# Patient Record
Sex: Female | Born: 1965 | Race: White | Hispanic: No | Marital: Married | Smoking: Never smoker
Health system: Southern US, Community
[De-identification: ages and names within clinical notes are randomized; demographics above are authoritative.]

## PROBLEM LIST (undated history)

## (undated) DIAGNOSIS — E7800 Pure hypercholesterolemia, unspecified: Secondary | ICD-10-CM

## (undated) DIAGNOSIS — Z803 Family history of malignant neoplasm of breast: Secondary | ICD-10-CM

## (undated) DIAGNOSIS — Z9221 Personal history of antineoplastic chemotherapy: Secondary | ICD-10-CM

## (undated) DIAGNOSIS — G4700 Insomnia, unspecified: Secondary | ICD-10-CM

## (undated) DIAGNOSIS — R232 Flushing: Secondary | ICD-10-CM

## (undated) DIAGNOSIS — Z170 Estrogen receptor positive status [ER+]: Principal | ICD-10-CM

## (undated) DIAGNOSIS — M2550 Pain in unspecified joint: Secondary | ICD-10-CM

## (undated) DIAGNOSIS — J302 Other seasonal allergic rhinitis: Secondary | ICD-10-CM

## (undated) DIAGNOSIS — F419 Anxiety disorder, unspecified: Secondary | ICD-10-CM

## (undated) DIAGNOSIS — C50511 Malignant neoplasm of lower-outer quadrant of right female breast: Principal | ICD-10-CM

## (undated) HISTORY — DX: Flushing: R23.2

## (undated) HISTORY — DX: Family history of malignant neoplasm of breast: Z80.3

## (undated) HISTORY — PX: APPENDECTOMY: SHX54

## (undated) HISTORY — DX: Malignant neoplasm of lower-outer quadrant of right female breast: C50.511

## (undated) HISTORY — DX: Pain in unspecified joint: M25.50

## (undated) HISTORY — DX: Pure hypercholesterolemia, unspecified: E78.00

## (undated) HISTORY — PX: WISDOM TOOTH EXTRACTION: SHX21

## (undated) HISTORY — DX: Insomnia, unspecified: G47.00

## (undated) HISTORY — PX: ADENOIDECTOMY W/ MYRINGOTOMY: SHX1128

## (undated) HISTORY — DX: Hypocalcemia: E83.51

## (undated) HISTORY — DX: Estrogen receptor positive status (ER+): Z17.0

---

## 2013-05-14 HISTORY — PX: ABDOMINAL HYSTERECTOMY: SHX81

## 2013-08-25 ENCOUNTER — Other Ambulatory Visit (HOSPITAL_BASED_OUTPATIENT_CLINIC_OR_DEPARTMENT_OTHER): Payer: Self-pay | Admitting: Physician Assistant

## 2013-08-25 ENCOUNTER — Other Ambulatory Visit (HOSPITAL_BASED_OUTPATIENT_CLINIC_OR_DEPARTMENT_OTHER): Payer: Self-pay

## 2013-08-25 DIAGNOSIS — K589 Irritable bowel syndrome without diarrhea: Secondary | ICD-10-CM

## 2013-08-25 DIAGNOSIS — R1010 Upper abdominal pain, unspecified: Secondary | ICD-10-CM

## 2013-08-26 ENCOUNTER — Encounter (HOSPITAL_BASED_OUTPATIENT_CLINIC_OR_DEPARTMENT_OTHER): Payer: Self-pay

## 2013-08-26 ENCOUNTER — Ambulatory Visit (HOSPITAL_BASED_OUTPATIENT_CLINIC_OR_DEPARTMENT_OTHER)
Admission: RE | Admit: 2013-08-26 | Discharge: 2013-08-26 | Disposition: A | Discharge status: Home / Self Care | Payer: 59 | Source: Ambulatory Visit | Attending: Physician Assistant | Admitting: Physician Assistant

## 2013-08-26 DIAGNOSIS — D259 Leiomyoma of uterus, unspecified: Secondary | ICD-10-CM | POA: Insufficient documentation

## 2013-08-26 DIAGNOSIS — J8410 Pulmonary fibrosis, unspecified: Secondary | ICD-10-CM | POA: Insufficient documentation

## 2013-08-26 DIAGNOSIS — M954 Acquired deformity of chest and rib: Secondary | ICD-10-CM | POA: Insufficient documentation

## 2013-08-26 DIAGNOSIS — K589 Irritable bowel syndrome without diarrhea: Secondary | ICD-10-CM

## 2013-08-26 DIAGNOSIS — K7689 Other specified diseases of liver: Secondary | ICD-10-CM | POA: Insufficient documentation

## 2013-08-26 DIAGNOSIS — K389 Disease of appendix, unspecified: Secondary | ICD-10-CM | POA: Insufficient documentation

## 2013-08-26 DIAGNOSIS — R1010 Upper abdominal pain, unspecified: Secondary | ICD-10-CM

## 2013-08-26 DIAGNOSIS — R109 Unspecified abdominal pain: Secondary | ICD-10-CM | POA: Insufficient documentation

## 2013-08-26 MED ORDER — IOHEXOL 300 MG/ML  SOLN
100.0000 mL | Freq: Once | INTRAMUSCULAR | Status: AC | PRN
  Administered 2013-08-26: 100 mL via INTRAVENOUS

## 2015-10-22 ENCOUNTER — Encounter (HOSPITAL_BASED_OUTPATIENT_CLINIC_OR_DEPARTMENT_OTHER): Payer: Self-pay | Admitting: *Deleted

## 2015-10-22 ENCOUNTER — Emergency Department (HOSPITAL_BASED_OUTPATIENT_CLINIC_OR_DEPARTMENT_OTHER)
Admission: EM | Admit: 2015-10-22 | Discharge: 2015-10-22 | Disposition: A | Discharge status: Home / Self Care | Payer: BLUE CROSS/BLUE SHIELD | Attending: Emergency Medicine | Admitting: Emergency Medicine

## 2015-10-22 ENCOUNTER — Emergency Department (HOSPITAL_BASED_OUTPATIENT_CLINIC_OR_DEPARTMENT_OTHER): Payer: BLUE CROSS/BLUE SHIELD

## 2015-10-22 DIAGNOSIS — S8991XA Unspecified injury of right lower leg, initial encounter: Secondary | ICD-10-CM | POA: Diagnosis present

## 2015-10-22 DIAGNOSIS — Y9389 Activity, other specified: Secondary | ICD-10-CM | POA: Insufficient documentation

## 2015-10-22 DIAGNOSIS — Y999 Unspecified external cause status: Secondary | ICD-10-CM | POA: Diagnosis not present

## 2015-10-22 DIAGNOSIS — Y92410 Unspecified street and highway as the place of occurrence of the external cause: Secondary | ICD-10-CM | POA: Insufficient documentation

## 2015-10-22 DIAGNOSIS — S8011XA Contusion of right lower leg, initial encounter: Secondary | ICD-10-CM | POA: Insufficient documentation

## 2015-10-22 MED ORDER — IBUPROFEN 400 MG PO TABS
600.0000 mg | ORAL_TABLET | Freq: Once | ORAL | Status: AC
  Administered 2015-10-22: 600 mg via ORAL
  Filled 2015-10-22: qty 1
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
## 2015-10-22 NOTE — Discharge Instructions (Signed)
Contusion °A contusion is a deep bruise. Contusions are the result of a blunt injury to tissues and muscle fibers under the skin. The injury causes bleeding under the skin. The skin overlying the contusion may turn blue, purple, or yellow. Minor injuries will give you a painless contusion, but more severe contusions may stay painful and swollen for a few weeks.  °CAUSES  °This condition is usually caused by a blow, trauma, or direct force to an area of the body. °SYMPTOMS  °Symptoms of this condition include: °· Swelling of the injured area. °· Pain and tenderness in the injured area. °· Discoloration. The area may have redness and then turn blue, purple, or yellow. °DIAGNOSIS  °This condition is diagnosed based on a physical exam and medical history. An X-ray, CT scan, or MRI may be needed to determine if there are any associated injuries, such as broken bones (fractures). °TREATMENT  °Specific treatment for this condition depends on what area of the body was injured. In general, the best treatment for a contusion is resting, icing, applying pressure to (compression), and elevating the injured area. This is often called the RICE strategy. Over-the-counter anti-inflammatory medicines may also be recommended for pain control.  °HOME CARE INSTRUCTIONS  °· Rest the injured area. °· If directed, apply ice to the injured area: °· Put ice in a plastic bag. °· Place a towel between your skin and the bag. °· Leave the ice on for 20 minutes, 2-3 times per day. °· If directed, apply light compression to the injured area using an elastic bandage. Make sure the bandage is not wrapped too tightly. Remove and reapply the bandage as directed by your health care provider. °· If possible, raise (elevate) the injured area above the level of your heart while you are sitting or lying down. °· Take over-the-counter and prescription medicines only as told by your health care provider. °SEEK MEDICAL CARE IF: °· Your symptoms do not  improve after several days of treatment. °· Your symptoms get worse. °· You have difficulty moving the injured area. °SEEK IMMEDIATE MEDICAL CARE IF:  °· You have severe pain. °· You have numbness in a hand or foot. °· Your hand or foot turns pale or cold. °  °This information is not intended to replace advice given to you by your health care provider. Make sure you discuss any questions you have with your health care provider. °  °Document Released: 02/07/2005 Document Revised: 01/19/2015 Document Reviewed: 09/15/2014 °[employer]vier Interactive Patient Education ©2016 [employer] ° °Motor Vehicle Collision °It is common to have multiple bruises and sore muscles after a motor vehicle collision (MVC). These tend to feel worse for the first 24 hours. You may have the most stiffness and soreness over the first several hours. You may also feel worse when you wake up the first morning after your collision. After this point, you will usually begin to improve with each day. The speed of improvement often depends on the severity of the collision, the number of injuries, and the location and nature of these injuries. °HOME CARE INSTRUCTIONS °· Put ice on the injured area. °¨ Put ice in a plastic bag. °¨ Place a towel between your skin and the bag. °¨ Leave the ice on for 15-20 minutes, 3-4 times a day, or as directed by your health care provider. °· Drink enough fluids to keep your urine clear or pale yellow. Do not drink alcohol. °· Take a warm shower or bath once or twice a   day. This will increase blood flow to sore muscles. °· You may return to activities as directed by your caregiver. Be careful when lifting, as this may aggravate neck or back pain. °· Only take over-the-counter or prescription medicines for pain, discomfort, or fever as directed by your caregiver. Do not use aspirin. This may increase bruising and bleeding. °SEEK IMMEDIATE MEDICAL CARE IF: °· You have numbness, tingling, or weakness in the arms or  legs. °· You develop severe headaches not relieved with medicine. °· You have severe neck pain, especially tenderness in the middle of the back of your neck. °· You have changes in bowel or bladder control. °· There is increasing pain in any area of the body. °· You have shortness of breath, light-headedness, dizziness, or fainting. °· You have chest pain. °· You feel sick to your stomach (nauseous), throw up (vomit), or sweat. °· You have increasing abdominal discomfort. °· There is blood in your urine, stool, or vomit. °· You have pain in your shoulder (shoulder strap areas). °· You feel your symptoms are getting worse. °MAKE SURE YOU: °· Understand these instructions. °· Will watch your condition. °· Will get help right away if you are not doing well or get worse. °  °This information is not intended to replace advice given to you by your health care provider. Make sure you discuss any questions you have with your health care provider. °  °Document Released: 04/30/2005 Document Revised: 05/21/2014 Document Reviewed: 09/27/2010 °[employer]vier Interactive Patient Education ©2016 [employer] ° °

## 2015-10-22 NOTE — ED Notes (Signed)
Pt given d/c instructions as per chart. Verbalizes understanding. No questions. 

## 2016-12-25 ENCOUNTER — Other Ambulatory Visit: Payer: Self-pay | Admitting: Obstetrics and Gynecology

## 2016-12-25 DIAGNOSIS — R921 Mammographic calcification found on diagnostic imaging of breast: Secondary | ICD-10-CM

## 2016-12-25 DIAGNOSIS — N63 Unspecified lump in unspecified breast: Secondary | ICD-10-CM

## 2017-01-01 ENCOUNTER — Other Ambulatory Visit: Payer: Self-pay | Admitting: Obstetrics and Gynecology

## 2017-01-01 DIAGNOSIS — R921 Mammographic calcification found on diagnostic imaging of breast: Secondary | ICD-10-CM

## 2017-01-01 DIAGNOSIS — N63 Unspecified lump in unspecified breast: Secondary | ICD-10-CM

## 2017-01-02 ENCOUNTER — Ambulatory Visit
Admission: RE | Admit: 2017-01-02 | Discharge: 2017-01-02 | Disposition: A | Discharge status: Home / Self Care | Payer: PRIVATE HEALTH INSURANCE | Source: Ambulatory Visit | Attending: Obstetrics and Gynecology | Admitting: Obstetrics and Gynecology

## 2017-01-02 DIAGNOSIS — N63 Unspecified lump in unspecified breast: Secondary | ICD-10-CM

## 2017-01-02 DIAGNOSIS — R921 Mammographic calcification found on diagnostic imaging of breast: Secondary | ICD-10-CM

## 2017-01-03 ENCOUNTER — Encounter: Payer: Self-pay | Admitting: *Deleted

## 2017-01-03 ENCOUNTER — Telehealth: Payer: Self-pay | Admitting: Hematology and Oncology

## 2017-01-03 NOTE — Telephone Encounter (Signed)
Patient confirmed [facility] appointment for 01/09/17 @ 8:15 am

## 2017-01-07 NOTE — Progress Notes (Addendum)
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 female who is seen today in our multidisciplinary breast clinic. The patient presented for routine screening mammogram at [facility] on 12/03/2016 and no mammographic evidence of malignancy was seen. However, the patient returned for diagnostic mammogram and right breast ultrasound at [facility] on 12/24/2016 complaining of palpable mass in the right lower breast detected clinically. These scans showed a suspicious ill-marginated irregular heterogenous hypoechoic mass in the 6:30 o'clock position 3 cm from the nipple of the right breast, measuring 3.6 x 1.6 x 2.2 cm. Also seen were indeterminate calcifications in the upper inner quadrant of the right breast and no evidence of right axillary lymphadenopathy.   Biopsies of the right breast were performed on 01/02/2017. Biopsy of the right breast at the 6:30 o'clock position revealed invasive and in situ mammary carcinoma, Grade 2, ER 95% / PR 95% / HER-2 negative / Ki-67 3%. The malignant cells are negative for E-Cadherin, supporting a lobular phenotype. Biopsy of the upper inner quadrant right breast calcifications showed flat epithelial atypia (FEA) with calcifications.   PREVIOUS RADIATION THERAPY: No  PAST MEDICAL HISTORY:  has a past medical history of 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, estrogen receptor positive ([place]) (01/08/2017).    PAST  SURGICAL HISTORY: Past Surgical History:  Procedure Laterality Date  . ABDOMINAL HYSTERECTOMY    . APPENDECTOMY      FAMILY HISTORY: family history is not on file.  SOCIAL HISTORY:  reports that she has never smoked. She does not have any smokeless tobacco history on file. She reports that she does not drink alcohol or use drugs.  ALLERGIES: Patient has no known allergies.  MEDICATIONS:  No current outpatient prescriptions on file.   No current facility-administered medications for this encounter.    Gynecologic History  Age at first menstrual period? N/A  Are you still having periods? No Approximate date of last period? October 2015  If you are still having periods: Are your periods regular? N/A  If you no longer have periods: Have you used hormone replacement? No  If YES, for how long? N/A When did you stop? N/A Obstetric History:  How many children have you carried to term? 2 Your age at first live birth? 42  Pregnant now or trying to get pregnant? No  Have you used birth control pills or hormone shots for contraception? Yes  If so, for how long (or approximate dates)? 1505-6979  Would you be interested in learning more about the options to preserve fertility? No Health Maintenance:  Have you ever had a colonoscopy? No If yes, date? N/A  Have you ever had a bone density? No If yes, date? N/A  Date of your last PAP smear? October 2017 Date of your FIRST mammogram? 2012   REVIEW OF SYSTEMS:  On review of systems, the patient reports that she is doing well overall. She reports loss of sleep. She uses glasses and contacts. She reports  breast pain and lump. She reports anxiety. A complete review of systems is obtained and is otherwise negative.  REVIEW OF SYSTEMS: A 10+ POINT REVIEW OF SYSTEMS WAS OBTAINED including neurology, dermatology, psychiatry, cardiac, respiratory, lymph, extremities, GI, GU, musculoskeletal, constitutional, reproductive, HEENT. All pertinent positives are  noted in the HPI. All others are negative.   PHYSICAL EXAM:  Vitals with BMI 01/09/2017  Height _0   Weight 122 lbs 2 oz  BMI 20.80  Systolic 223  Diastolic 86  Pulse 80  Respirations 20   General: Alert and oriented, in no acute distress HEENT: Head is normocephalic. Extraocular movements are intact.  Neck: Neck is supple, no palpable cervical or supraclavicular lymphadenopathy. Heart: Regular in rate and rhythm with no murmurs, rubs, or gallops. Chest: Clear to auscultation bilaterally, with no rhonchi, wheezes, or rales. Abdomen: Soft, nontender, nondistended, with no rigidity or guarding. Extremities: No cyanosis or edema. Lymphatics: see Neck Exam Skin: No concerning lesions. Musculoskeletal: symmetric strength and muscle tone throughout. Neurologic: Cranial nerves II through XII are grossly intact. No obvious focalities. Speech is fluent. Coordination is intact. Psychiatric: Judgment and insight are intact. Affect is appropriate. Breast: Left breast no palpable mass or nipple discharge. Right breast has some bruising in the upper inner quadrant. Patient has what appears to be a large mass in the inferior aspect of the right breast extending underneath the nipple areolar region, measuring approximately 3.5 x 4 cm. She may have some swelling from bruising which makes exact measurement difficult. No palpable lymph nodes on either side. Right breast is slightly smaller than the left breast.   ECOG = 1  0 - Asymptomatic (Fully active, able to carry on all predisease activities without restriction)  1 - Symptomatic but completely ambulatory (Restricted in physically strenuous activity but ambulatory and able to carry out work of a light or sedentary nature. For example, light housework, office work)  2 - Symptomatic, <50% in bed during the day (Ambulatory and capable of all self care but unable to carry out any work activities. Up and about more than 50% of waking hours)  3 -  Symptomatic, >50% in bed, but not bedbound (Capable of only limited self-care, confined to bed or chair 50% or more of waking hours)  4 - Bedbound (Completely disabled. Cannot carry on any self-care. Totally confined to bed or chair)  5 - Death   Eustace Pen MM, Creech RH, Tormey DC, et al. [phone]). "Toxicity and response criteria of the [facility]". [place] Oncol. 5 (6): 649-55  LABORATORY DATA:  Lab Results  Component Value Date   WBC 4.8 01/09/2017   HGB 13.3 01/09/2017   HCT 39.7 01/09/2017   MCV 89.9 01/09/2017   PLT 249 01/09/2017   NEUTROABS 2.9 01/09/2017   Lab Results  Component Value Date   NA 137 01/09/2017   K 3.9 01/09/2017   CO2 25 01/09/2017   GLUCOSE 110 01/09/2017   CREATININE 0.9 01/09/2017   CALCIUM 9.4 01/09/2017      RADIOGRAPHY: Mm Clip Placement Right  Result Date: 01/02/2017 CLINICAL DATA:  Status post ultrasound-guided core needle biopsy of a palpable ill-defined hypoechoic 6:30 o'clock position right breast mass and stereotactic core needle biopsy of right breast calcifications. EXAM: DIAGNOSTIC RIGHT MAMMOGRAM POST ULTRASOUND BIOPSY DIAGNOSTIC RIGHT MAMMOGRAM POST STEREOTACTIC BIOPSY COMPARISON:  Previous exam(s). FINDINGS: Mammographic images were obtained following ultrasound guided biopsy of a palpable, ill-defined 6:30 o'clock position right breast mass, and stereotactic core needle biopsy of  upper inner quadrant right breast calcifications. Three ribbon shaped biopsy clip, corresponding to the ultrasound-guided biopsy and palpable 630 o'clock mass, lies in the expected location of the palpable mass and sonographic abnormality. The coil shaped biopsy clip lies adjacent to a few residual upper outer quadrant calcifications. IMPRESSION: Well-positioned ribbon shaped biopsy clip marking the biopsied palpable lesion in the lower outer quadrant of the right breast. Well-positioned coil shaped biopsy clip marking the upper inner quadrant  calcifications. Final Assessment: Post Procedure Mammograms for Marker Placement Electronically Signed   By: Lajean Manes M.D.   On: 01/02/2017 08:51   Mm Rt Breast Bx W Loc Dev 1st Lesion Image Bx Spec Stereo Guide  Addendum Date: 01/04/2017   ADDENDUM REPORT: 01/03/2017 10:53 ADDENDUM: Pathology revealed grade II invasive mammary carcinoma and mammary carcinoma in situ in the RIGHT breast at 6:30. Pathology revealed flat epithelial atypia with calcifications in the upper inner quadrant of the RIGHT breast. This was found to be concordant by Dr. Lajean Manes. Pathology results were discussed with the patient by telephone. The patient reported doing well after the biopsies with tenderness at the sites. Post biopsy instructions and care were reviewed and questions were answered. The patient was encouraged to call [facility] for any additional concerns. The patient was referred to the [place] [facility] at the [facility] on January 09, 2017. Pathology results reported by Susa Raring RN, BSN on 01/03/2017. Electronically Signed   By: Lajean Manes M.D.   On: 01/03/2017 10:53   Result Date: 01/04/2017 CLINICAL DATA:  Patient presents for stereotactic core needle biopsy of calcifications in the upper inner right breast. EXAM: RIGHT BREAST STEREOTACTIC CORE NEEDLE BIOPSY COMPARISON:  Previous exams. FINDINGS: The patient and I discussed the procedure of stereotactic-guided biopsy including benefits and alternatives. We discussed the high likelihood of a successful procedure. We discussed the risks of the procedure including infection, bleeding, tissue injury, clip migration, and inadequate sampling. Informed written consent was given. The usual time out protocol was performed immediately prior to the procedure. Using sterile technique and 1% Lidocaine as local anesthetic, under stereotactic guidance, a 9 gauge vacuum assisted device was used to  perform core needle biopsy of calcifications in the upper inner quadrant of the right breast using a cranial to caudal approach. Specimen radiograph was performed showing multiple calcifications for which biopsy was performed. Specimens with calcifications are identified for pathology. Lesion quadrant: Upper inner quadrant At the conclusion of the procedure, a coil shaped tissue marker clip was deployed into the biopsy cavity. Follow-up 2-view mammogram was performed and dictated separately. IMPRESSION: Stereotactic-guided biopsy of right breast calcifications. No apparent complications. Electronically Signed: By: Lajean Manes M.D. On: 01/02/2017 08:41   [place] Rt Breast Bx W Loc Dev 1st Lesion Img Bx Spec US Guide  Addendum Date: 01/04/2017   ADDENDUM REPORT: 01/03/2017 10:53 ADDENDUM: Pathology revealed grade II invasive mammary carcinoma and mammary carcinoma in situ in the RIGHT breast at 6:30. Pathology revealed flat epithelial atypia with calcifications in the upper inner quadrant of the RIGHT breast. This was found to be concordant by Dr. Lajean Manes. Pathology results were discussed with the patient by telephone. The patient reported doing well after the biopsies with tenderness at the sites. Post biopsy instructions and care were reviewed and questions were answered. The patient was encouraged to call [facility] for any additional concerns. The patient was [facility] on January 09, 2017. Pathology results reported by Susa Raring RN, BSN on 01/03/2017. Electronically Signed   By: Lajean Manes M.D.   On: 01/03/2017 10:53   Result Date: 01/04/2017 CLINICAL DATA:  Patient presents for ultrasound-guided core needle biopsy of and ill-defined mixed echogenicity mass in the 6:30 o'clock position of the right breast, which is palpable. EXAM: ULTRASOUND GUIDED RIGHT BREAST CORE NEEDLE BIOPSY  COMPARISON:  Previous exam(s). FINDINGS: I met with the patient and we discussed the procedure of ultrasound-guided biopsy, including benefits and alternatives. We discussed the high likelihood of a successful procedure. We discussed the risks of the procedure, including infection, bleeding, tissue injury, clip migration, and inadequate sampling. Informed written consent was given. The usual time-out protocol was performed immediately prior to the procedure. Lesion quadrant: Lower outer quadrant Using sterile technique and 1% Lidocaine as local anesthetic, under direct ultrasound visualization, a 12 gauge spring-loaded device was used to perform biopsy of the palpable mass reflected by ill-defined heterogeneous hypoechogenicity in the inferior right breast using a lateral approach. At the conclusion of the procedure a ribbon shaped tissue marker clip was deployed into the biopsy cavity. Follow up 2 view mammogram was performed and dictated separately. IMPRESSION: Ultrasound guided biopsy of the right breast. No apparent complications. Electronically Signed: By: Lajean Manes M.D. 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   Given the size of the lesion it appears the patient will need a mastectomy and she is comfortable with this approach. She will have a mastectomy with sentinel node procedure. She will undergo Breast MRI and OncotypeDX. She will meet with Dr. Marla Roe on August 30th to discuss immediate versus delayed reconstruction. She may potentially benefit from postmastectomy irradiation depending on the tumor size margin status and lymph node status.  PLAN: Today, I talked to the patient about the findings and work-up thus far. We discussed the patient's diagnosis of right breast cancer and general treatment for this, highlighting the role of radiotherapy in the management of post mastectomy patients. We  discussed the available radiation techniques, and focused on the details of logistics and delivery.  We discussed the risks, benefits, and side effects of radiotherapy.     ------------------------------------------------  Blair Promise, PhD, MD  This document serves as a record of services personally performed by Gery Pray, MD. It was created on his behalf by Arlyce Harman, a trained medical scribe. The creation of this record is based on the scribe's personal observations and the provider's statements to them. This document has been checked and approved by the attending provider.

## 2017-01-08 ENCOUNTER — Other Ambulatory Visit: Payer: Self-pay | Admitting: *Deleted

## 2017-01-08 ENCOUNTER — Encounter: Payer: Self-pay | Admitting: *Deleted

## 2017-01-08 DIAGNOSIS — C50511 Malignant neoplasm of lower-outer quadrant of right female breast: Secondary | ICD-10-CM | POA: Insufficient documentation

## 2017-01-08 DIAGNOSIS — Z170 Estrogen receptor positive status [ER+]: Principal | ICD-10-CM

## 2017-01-08 HISTORY — DX: Malignant neoplasm of lower-outer quadrant of right female breast: C50.511

## 2017-01-09 ENCOUNTER — Ambulatory Visit (HOSPITAL_BASED_OUTPATIENT_CLINIC_OR_DEPARTMENT_OTHER): Payer: PRIVATE HEALTH INSURANCE | Admitting: Hematology and Oncology

## 2017-01-09 ENCOUNTER — Encounter: Payer: Self-pay | Admitting: Hematology and Oncology

## 2017-01-09 ENCOUNTER — Ambulatory Visit: Payer: PRIVATE HEALTH INSURANCE | Attending: General Surgery | Admitting: Physical Therapy

## 2017-01-09 ENCOUNTER — Other Ambulatory Visit (HOSPITAL_BASED_OUTPATIENT_CLINIC_OR_DEPARTMENT_OTHER): Payer: PRIVATE HEALTH INSURANCE

## 2017-01-09 ENCOUNTER — Other Ambulatory Visit: Payer: Self-pay | Admitting: *Deleted

## 2017-01-09 ENCOUNTER — Encounter: Payer: Self-pay | Admitting: *Deleted

## 2017-01-09 ENCOUNTER — Ambulatory Visit: Payer: Self-pay | Admitting: General Surgery

## 2017-01-09 ENCOUNTER — Ambulatory Visit
Admission: RE | Admit: 2017-01-09 | Discharge: 2017-01-09 | Disposition: A | Discharge status: Home / Self Care | Payer: PRIVATE HEALTH INSURANCE | Source: Ambulatory Visit | Attending: Radiation Oncology | Admitting: Radiation Oncology

## 2017-01-09 ENCOUNTER — Encounter: Payer: Self-pay | Admitting: Physical Therapy

## 2017-01-09 DIAGNOSIS — C50511 Malignant neoplasm of lower-outer quadrant of right female breast: Secondary | ICD-10-CM | POA: Diagnosis present

## 2017-01-09 DIAGNOSIS — Z170 Estrogen receptor positive status [ER+]: Principal | ICD-10-CM

## 2017-01-09 DIAGNOSIS — R293 Abnormal posture: Secondary | ICD-10-CM | POA: Diagnosis not present

## 2017-01-09 LAB — COMPREHENSIVE METABOLIC PANEL
ALT: 12 U/L (ref 0–55)
ANION GAP: 6 meq/L (ref 3–11)
AST: 17 U/L (ref 5–34)
Albumin: 3.9 g/dL (ref 3.5–5.0)
Alkaline Phosphatase: 57 U/L (ref 40–150)
BUN: 10 mg/dL (ref 7.0–26.0)
CHLORIDE: 106 meq/L (ref 98–109)
CO2: 25 meq/L (ref 22–29)
CREATININE: 0.9 mg/dL (ref 0.6–1.1)
Calcium: 9.4 mg/dL (ref 8.4–10.4)
EGFR: 72 mL/min/{1.73_m2} — AB (ref >90)
GLUCOSE: 110 mg/dL (ref 70–140)
POTASSIUM: 3.9 meq/L (ref 3.5–5.1)
Sodium: 137 mEq/L (ref 136–145)
Total Bilirubin: 0.88 mg/dL (ref 0.20–1.20)
Total Protein: 7.1 g/dL (ref 6.4–8.3)

## 2017-01-09 LAB — CBC WITH DIFFERENTIAL/PLATELET
BASO%: 1.6 % (ref 0.0–2.0)
Basophils Absolute: 0.1 10*3/uL (ref 0.0–0.1)
EOS%: 0.6 % (ref 0.0–7.0)
Eosinophils Absolute: 0 10*3/uL (ref 0.0–0.5)
HCT: 39.7 % (ref 34.8–46.6)
HGB: 13.3 g/dL (ref 11.6–15.9)
LYMPH%: 25.7 % (ref 14.0–49.7)
MCH: 30.1 pg (ref 25.1–34.0)
MCHC: 33.5 g/dL (ref 31.5–36.0)
MCV: 89.9 fL (ref 79.5–101.0)
MONO#: 0.5 10*3/uL (ref 0.1–0.9)
MONO%: 10.5 % (ref 0.0–14.0)
NEUT#: 2.9 10*3/uL (ref 1.5–6.5)
NEUT%: 61.6 % (ref 38.4–76.8)
PLATELETS: 249 10*3/uL (ref 145–400)
RBC: 4.42 10*6/uL (ref 3.70–5.45)
RDW: 12.6 % (ref 11.2–14.5)
WBC: 4.8 10*3/uL (ref 3.9–10.3)
lymph#: 1.2 10*3/uL (ref 0.9–3.3)

## 2017-01-09 MED ORDER — TAMOXIFEN CITRATE 20 MG PO TABS: 20.0000 mg | ORAL_TABLET | Freq: Every day | ORAL | 3 refills | Status: DC

## 2017-01-09 NOTE — Progress Notes (Signed)
Clinical Social Work [place] Psychosocial Distress Screening [place]  Patient completed distress screening protocol and scored a 9 on the Psychosocial Distress Thermometer which indicates severe distress. Clinical Social Worker met with patient and patients husband in [facility] to assess for distress and other psychosocial needs. Patient stated she was feeling overwhelmed but felt "better" after meeting with the treatment team and getting more information on her treatment plan. CSW and patient discussed common feeling and emotions when being diagnosed with cancer, and the importance of support during treatment. CSW informed patient of the support team and support services at [facility]. CSW provided contact information and encouraged patient to call with any questions or concerns.  ONCBCN DISTRESS SCREENING 01/09/2017  Screening Type Initial Screening  Distress experienced in past week (1-10) 9  Emotional problem type Adjusting to illness  Information Concerns Type Lack of info about treatment;Lack of info about diagnosis  Physical Problem type Sleep/insomnia;Constipation/diarrhea  Physician notified of physical symptoms Yes     Johnnye Lana, MSW, LCSW, OSW-C Clinical Social Worker [facility] [phone]

## 2017-01-09 NOTE — Therapy (Signed)
[place], [place], [address] Phone: [phone]   Fax:  [phone]  Physical Therapy Evaluation  Patient Details  Name: Angelica Floyd MRN: [id number] Date of Birth: 06/17/1965 Referring Provider: Dr. Autumn Messing  Encounter Date: 01/09/2017      PT End of Session - 01/09/17 1318    Visit Number 1   Number of Visits 1   PT Start Time 1000   PT Stop Time 8325  Also saw pt from 1119-1131 for a total of 29 minutes   PT Time Calculation (min) 17 min   Activity Tolerance Patient tolerated treatment well   Behavior During Therapy [facility] for tasks assessed/performed      Past Medical History:  Diagnosis Date  . 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, estrogen receptor positive ([place]) 01/08/2017    Past Surgical History:  Procedure Laterality Date  . ABDOMINAL HYSTERECTOMY    . APPENDECTOMY    . CESAREAN SECTION      There were no vitals filed for this visit.       Subjective Assessment - 01/09/17 1259    Subjective Patient reports she is here today to be seen by her medical team for her newly diagnosed right breast cancer.   Patient is accompained by: Family member   Pertinent History Patient was diagnoesd on 01/02/17 with right invasive lobular carcinoma breast cancer. There are 2 areas. One measures 1.4 cm of calcs in the upper inner quadrant and the other is a mass that measures 3.6 cm in the lower outer quadrant. It is ER/PR positive and HER2 negative with a Ki67 of 3%.   Patient Stated Goals Reduce lymphedema risk and learn post op shoulder ROM HEP   Currently in Pain? No/denies            [facility] PT Assessment - 01/09/17 0001      Assessment   Medical Diagnosis Right breast cancer   Referring Provider Dr. Autumn Messing   Onset Date/Surgical Date 01/02/17   Hand Dominance Right   Prior Therapy none     Precautions   Precautions Other (comment)   Precaution  Comments active cancer     Restrictions   Weight Bearing Restrictions No     Balance Screen   Has the patient fallen in the past 6 months No   Has the patient had a decrease in activity level because of a fear of falling?  No   Is the patient reluctant to leave their home because of a fear of falling?  No     Home Environment   Living Environment Private residence   Living Arrangements Spouse/significant other;Children  Husband and 28 and 51 y.o. 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   Leisure Runs 30 min 5x/week     Cognition   Overall Cognitive Status Within Functional Limits for tasks assessed     Posture/Postural Control   Posture/Postural Control Postural limitations   Postural Limitations Rounded Shoulders;Forward head     ROM / Strength   AROM / PROM / Strength AROM;Strength     AROM   AROM Assessment Site Shoulder;Cervical   Right/Left Shoulder Right;Left   Right Shoulder Extension 58 Degrees   Right Shoulder Flexion 130 Degrees   Right Shoulder ABduction 140 Degrees  Right Shoulder Internal Rotation 75 Degrees   Right Shoulder External Rotation 90 Degrees   Left Shoulder Extension 65 Degrees   Left Shoulder Flexion 131 Degrees   Left Shoulder ABduction 142 Degrees   Left Shoulder Internal Rotation 74 Degrees   Left Shoulder External Rotation 90 Degrees   Cervical Flexion WNL   Cervical Extension WNL   Cervical - Right Side Bend WNL   Cervical - Left Side Bend WNL   Cervical - Right Rotation WNL   Cervical - Left Rotation WNL     Strength   Overall Strength Within functional limits for tasks performed           LYMPHEDEMA/ONCOLOGY QUESTIONNAIRE - 01/09/17 1306      Type   Cancer Type right breast cancer     Lymphedema Assessments   Lymphedema Assessments Upper extremities     Right Upper  Extremity Lymphedema   10 cm Proximal to Olecranon Process 23.8 cm   Olecranon Process 22.4 cm   10 cm Proximal to Ulnar Styloid Process 20.6 cm   Just Proximal to Ulnar Styloid Process 12.9 cm   Across Hand at [employer] 16.7 cm   At [place] of 2nd Digit 5.8 cm     Left Upper Extremity Lymphedema   10 cm Proximal to Olecranon Process 23.9 cm   Olecranon Process 23.2 cm   10 cm Proximal to Ulnar Styloid Process 19.6 cm   Just Proximal to Ulnar Styloid Process 13.2 cm   Across Hand at [employer] 17.7 cm   At [place] of 2nd Digit 5.8 cm         Objective measurements completed on examination: See above findings.    Patient was instructed today in a home exercise program today for post op shoulder range of motion. These included active assist shoulder flexion in sitting, scapular retraction, wall walking with shoulder abduction, and hands behind head external rotation.  She was encouraged to do these twice a day, holding 3 seconds and repeating 5 times when permitted by her physician.         PT Education - 01/09/17 1318    Education provided Yes   Education Details Lymphedema risk reduction and post op shoulder ROM HEP   Person(s) Educated Patient;Spouse   Methods Explanation;Demonstration;Handout   Comprehension Returned demonstration;Verbalized understanding              Breast Clinic Goals - 01/09/17 1430      Patient will be able to verbalize understanding of pertinent lymphedema risk reduction practices relevant to her diagnosis specifically related to skin care.   Time 1   Period Days   Status Achieved     Patient will be able to return demonstrate and/or verbalize understanding of the post-op home exercise program related to regaining shoulder range of motion.   Time 1   Period Days   Status Achieved     Patient will be able to verbalize understanding of the importance of attending the postoperative After Breast Cancer Class for further lymphedema  risk reduction education and therapeutic exercise.   Time 1   Period Days   Status Achieved               Plan - 01/09/17 1319    Clinical Impression Statement Patient was diagnoesd on 01/02/17 with right invasive lobular carcinoma breast cancer. There are 2 areas. One measures 1.4 cm of calcs in the upper inner quadrant and the other is a mass that  measures 3.6 cm in the lower outer quadrant. It is ER/PR positive and HER2 negative with a Ki67 of 3%. Her medical team met prior to her assessments to determine a recommended treatment plan. She is planning to have an MRI, a right mastectomy and sentinel node biopsy, oncotype testing, and anti-estrogen therapy. She may benefit from post op PT to regain shoulder ROM and reduce lymphedema risk.   History and Personal Factors relevant to plan of care: recent cancer diagnosis   Clinical Presentation Stable   Clinical Decision Making Low   Rehab Potential Excellent   Clinical Impairments Affecting Rehab Potential None   PT Frequency One time visit   PT Treatment/Interventions Patient/family education;Therapeutic exercise   PT Next Visit Plan Will f/u after surgery to determine PT needs   PT Home Exercise Plan post op shoulder ROM HEP   Consulted and Agree with Plan of Care Patient;Family member/caregiver   Family Member Consulted Husband      Patient will benefit from skilled therapeutic intervention in order to improve the following deficits and impairments:  Postural dysfunction, Decreased knowledge of precautions, Pain, Impaired UE functional use, Decreased range of motion  Visit Diagnosis: Abnormal posture - Plan: PT plan of care cert/re-cert  Carcinoma of lower-outer quadrant of right breast in female, estrogen receptor positive ([place]) - Plan: PT plan of care cert/re-cert   Patient will follow up at outpatient cancer rehab if needed following surgery.  If the patient requires physical therapy at that time, a specific plan will be  dictated and sent to the referring physician for approval. The patient was educated today on appropriate basic range of motion exercises to begin post operatively and the importance of attending the After Breast Cancer class following surgery.  Patient was educated today on lymphedema risk reduction practices as it pertains to recommendations that will benefit the patient immediately following surgery.  She verbalized good understanding.  No additional physical therapy is indicated at this time.     Problem List Patient Active Problem List   Diagnosis Date Noted  . 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, estrogen receptor positive ([place]) 01/08/2017    Annia Friendly, PT 01/09/17 2:47 PM  [place], [place], [address] Phone: [phone]   Fax:  [phone]  Name: Angelica Floyd MRN: [id number] Date of Birth: [birth year]

## 2017-01-09 NOTE — Patient Instructions (Signed)

## 2017-01-09 NOTE — Assessment & Plan Note (Signed)
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 After reviewing the pathology in detail, we proceeded to discuss the different treatment options between surgery, radiation, chemotherapy, antiestrogen therapies.  Recommendation: 1. Breast MRI 2. breast conserving surgery 3. Oncotype DX testing to determine if she would benefit from chemotherapy 4. adjuvant radiation 5. Adjuvant antiestrogen therapy with tamoxifen 20 mg daily. Because it will be a few weeks before she has surgery, I recommended starting tamoxifen 20 mg by mouth daily.  Tamoxifen counseling: We discussed the risks and benefits of tamoxifen. These include but not limited to insomnia, hot flashes, mood changes, vaginal dryness, and weight gain. Although rare, serious side effects including endometrial cancer, risk of blood clots were also discussed. We strongly believe that the benefits far outweigh the risks. Patient understands these risks and consented to starting treatment.

## 2017-01-09 NOTE — Progress Notes (Signed)
Nutrition Assessment  Reason for Assessment:  Pt seen in Breast Clinic  ASSESSMENT:   51 year old female with new diagnosis of breast cancer.  Past medical history reviewed.    Patient reports normal appetite.    Medications:  MVI  Labs: reviewed  Anthropometrics:   Height: 63 inches Weight: 122 lb BMI: 21   NUTRITION DIAGNOSIS: Food and nutrition related knowledge deficit related to new diagnosis of breast cancer as evidenced by no prior need for nutrition related information.  INTERVENTION:   Discussed and provided packet of information regarding nutritional tips for breast cancer patients.  Questions answered.  Teachback method used.  Contact information provided and patient knows to contact me with questions/concerns.    MONITORING, EVALUATION, and GOAL: Pt will consume a healthy plant based diet to maintain lean body mass throughout treatment.   Angelica Floyd, [place], [place] Registered Dietitian [phone] (pager)

## 2017-01-09 NOTE — Progress Notes (Signed)
[place] NOTE  Patient Care Team: Jettie Booze, NP as PCP - General (Family Medicine) Nicholas Lose, MD as Consulting Physician (Hematology and Oncology) Jovita Kussmaul, MD as Consulting Physician (General Surgery) Gery Pray, MD as Consulting Physician (Radiation Oncology)  CHIEF COMPLAINTS/PURPOSE OF CONSULTATION:  Newly diagnosed breast cancer  HISTORY OF PRESENTING ILLNESS:  Angelica Floyd 51 y.o. female is here because of recent diagnosis of right breast cancer. Patient felt a lump in the right breast underneath the nipple this led to further evaluation with a mammogram and ultrasound. At 6:30 position there was a 3.6cm mass, in addition calcifications measuring 1.4 cm in the upper inner quadrant also noted. 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 Patient was presented this morning in the multidisciplinary tumor board and she is here today to discuss the treatment plan.  I reviewed her records extensively and collaborated the history with the patient.  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, estrogen receptor positive ([place]) 01/08/2017    SURGICAL HISTORY: Past Surgical History:  Procedure Laterality Date  . ABDOMINAL HYSTERECTOMY    . APPENDECTOMY    . CESAREAN SECTION      SOCIAL HISTORY: Social History   Social History  . Marital  status: Married    Spouse name: N/A  . Number of children: N/A  . Years of education: N/A   Occupational History  . Not on file.   Social History Main Topics  . Smoking status: Never Smoker  . Smokeless tobacco: Not on file  . Alcohol use 3.0 oz/week    5 Glasses of wine per week  . Drug use: No  . Sexual activity: Not on file   Other Topics Concern  . Not on file   Social History Narrative  . No narrative on file    FAMILY HISTORY: History reviewed. No pertinent family history.  ALLERGIES:  has No Known Allergies.  MEDICATIONS:  Current Outpatient Prescriptions  Medication Sig Dispense Refill  . Multiple Vitamins-Minerals (CENTRUM ADULTS) TABS Take 1 tablet by mouth.    . Red Yeast Rice 600 MG CAPS Take 1 capsule by mouth.    . tamoxifen (NOLVADEX) 20 MG tablet Take 1 tablet (20 mg total) by mouth daily. 30 tablet 3   No current facility-administered medications for this visit.     REVIEW OF SYSTEMS:   Constitutional: Denies fevers, chills or abnormal night sweats Eyes: Denies blurriness of vision, double vision or watery eyes Ears, nose, mouth, throat, and face: Denies mucositis or sore throat Respiratory: Denies cough, dyspnea or wheezes Cardiovascular: Denies palpitation, chest discomfort or lower extremity swelling Gastrointestinal:  Denies nausea, heartburn or change in bowel habits Skin: Denies abnormal skin rashes Lymphatics: Denies new lymphadenopathy or easy bruising Neurological:Denies numbness, tingling or new weaknesses Behavioral/Psych: Mood is stable, no new changes  Breast: Palpable lump in the right breast All  other systems were reviewed with the patient and are negative.  PHYSICAL EXAMINATION: ECOG PERFORMANCE STATUS: 1 - Symptomatic but completely ambulatory  Vitals:   01/09/17 0835  BP: 138/86  Pulse: 80  Resp: 20  Temp: 98.4 F (36.9 C)  SpO2: 100%   Filed Weights   01/09/17 0835  Weight: 122 lb 1.6 oz (55.4 kg)     GENERAL:alert, no distress and comfortable SKIN: skin color, texture, turgor are normal, no rashes or significant lesions EYES: normal, conjunctiva are pink and non-injected, sclera clear OROPHARYNX:no exudate, no erythema and lips, buccal mucosa, and tongue normal  NECK: supple, thyroid normal size, non-tender, without nodularity LYMPH:  no palpable lymphadenopathy in the cervical, axillary or inguinal LUNGS: clear to auscultation and percussion with normal breathing effort HEART: regular rate & rhythm and no murmurs and no lower extremity edema ABDOMEN:abdomen soft, non-tender and normal bowel sounds Musculoskeletal:no cyanosis of digits and no clubbing  PSYCH: alert & oriented x 3 with fluent speech NEURO: no focal motor/sensory deficits BREAST: No palpable nodules in breast. No palpable axillary or supraclavicular lymphadenopathy (exam performed in the presence of a chaperone)   LABORATORY DATA:  I have reviewed the data as listed Lab Results  Component Value Date   WBC 4.8 01/09/2017   HGB 13.3 01/09/2017   HCT 39.7 01/09/2017   MCV 89.9 01/09/2017   PLT 249 01/09/2017   Lab Results  Component Value Date   NA 137 01/09/2017   K 3.9 01/09/2017   CO2 25 01/09/2017    RADIOGRAPHIC STUDIES: I have personally reviewed the radiological reports and agreed with the findings in the report.  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 After reviewing the pathology in detail, we proceeded to discuss the different treatment options between surgery, radiation, chemotherapy, antiestrogen therapies.  Recommendation: 1. Breast MRI 2. breast conserving surgery 3. Oncotype DX testing to determine if she would benefit from chemotherapy 4. adjuvant radiation 5. Adjuvant antiestrogen therapy with tamoxifen 20 mg daily. Because it will be a few weeks before she has surgery, I recommended starting tamoxifen 20 mg by mouth daily.  Tamoxifen counseling: We discussed the risks and benefits of tamoxifen. These include but not limited to insomnia, hot flashes, mood changes, vaginal dryness, and weight gain. Although rare, serious side effects including endometrial cancer, risk of blood clots were also discussed. We strongly believe that the benefits far outweigh the risks. Patient understands these risks and consented to starting treatment.  All questions were answered. The patient knows to call the clinic with any problems, questions or concerns.    Rulon Eisenmenger, MD 01/09/17

## 2017-01-15 ENCOUNTER — Telehealth: Payer: Self-pay | Admitting: *Deleted

## 2017-01-15 ENCOUNTER — Telehealth: Payer: Self-pay | Admitting: Hematology and Oncology

## 2017-01-15 NOTE — Telephone Encounter (Signed)
Spoke with patient and scheduled her post op appt with Dr.Gudena.

## 2017-01-15 NOTE — Telephone Encounter (Signed)
Spoke to pt regarding [place] from 01/09/17. Denies questions or concerns regarding dx or treatment care plan. Encourage pt to call with needs. Received understanding.

## 2017-01-16 ENCOUNTER — Ambulatory Visit (HOSPITAL_COMMUNITY)
Admission: RE | Admit: 2017-01-16 | Discharge: 2017-01-16 | Disposition: A | Discharge status: Home / Self Care | Payer: PRIVATE HEALTH INSURANCE | Source: Ambulatory Visit | Attending: General Surgery | Admitting: General Surgery

## 2017-01-16 ENCOUNTER — Encounter (INDEPENDENT_AMBULATORY_CARE_PROVIDER_SITE_OTHER): Payer: Self-pay

## 2017-01-16 DIAGNOSIS — Z170 Estrogen receptor positive status [ER+]: Secondary | ICD-10-CM | POA: Diagnosis present

## 2017-01-16 DIAGNOSIS — N6312 Unspecified lump in the right breast, upper inner quadrant: Secondary | ICD-10-CM | POA: Insufficient documentation

## 2017-01-16 DIAGNOSIS — C50511 Malignant neoplasm of lower-outer quadrant of right female breast: Secondary | ICD-10-CM | POA: Diagnosis not present

## 2017-01-16 MED ORDER — GADOBENATE DIMEGLUMINE 529 MG/ML IV SOLN
15.0000 mL | Freq: Once | INTRAVENOUS | Status: AC | PRN
  Administered 2017-01-16: 11 mL via INTRAVENOUS

## 2017-01-17 ENCOUNTER — Other Ambulatory Visit: Payer: Self-pay | Admitting: General Surgery

## 2017-01-17 DIAGNOSIS — N63 Unspecified lump in unspecified breast: Secondary | ICD-10-CM

## 2017-01-18 ENCOUNTER — Telehealth: Payer: Self-pay | Admitting: *Deleted

## 2017-01-18 NOTE — Telephone Encounter (Signed)
Left vm for pt to return call to discuss MRI bx for left breast. Contact information provided.

## 2017-01-21 ENCOUNTER — Ambulatory Visit: Payer: Self-pay | Admitting: General Surgery

## 2017-01-21 DIAGNOSIS — Z170 Estrogen receptor positive status [ER+]: Principal | ICD-10-CM

## 2017-01-21 DIAGNOSIS — C50511 Malignant neoplasm of lower-outer quadrant of right female breast: Secondary | ICD-10-CM

## 2017-01-22 NOTE — Pre-Procedure Instructions (Signed)
Kinley Dozier  01/22/2017      [id number] - [place], [place] - [address] [place] [address] Phone: [phone] Fax: [phone]    Your procedure is scheduled on January 28, 2017.  Report to [facility] Admitting at 800 AM.  Call this number if you have problems the morning of surgery:  [phone]   Remember:  Do not eat food or drink liquids after midnight.  Take these medicines the morning of surgery with A SIP OF WATER tamoxifen (nolvadex), eye drops if needed.  7 days prior to surgery STOP taking any Aspirin, Aleve, Naproxen, Ibuprofen, Motrin, Advil, Goody's, BC's, all herbal medications, fish oil, and all vitamins   Do not wear jewelry, make-up or nail polish.  Do not wear lotions, powders, or perfumes, or deoderant.  Do not shave 48 hours prior to surgery.    Do not bring valuables to the hospital.  [facility] is not responsible for any belongings or valuables.  Contacts, dentures or bridgework may not be worn into surgery.  Leave your suitcase in the car.  After surgery it may be brought to your room.  For patients admitted to the hospital, discharge time will be determined by your treatment team.  Patients discharged the day of surgery will not be allowed to drive home.   Special instructions:   [place]- Preparing For Surgery  Before surgery, you can play an important role. Because skin is not sterile, your skin needs to be as free of germs as possible. You can reduce the number of germs on your skin by washing with CHG (chlorahexidine gluconate) Soap before surgery.  CHG is an antiseptic cleaner which kills germs and bonds with the skin to continue killing germs even after washing.  Please do not use if you have an allergy to CHG or antibacterial soaps. If your skin becomes reddened/irritated stop using the CHG.  Do not shave (including legs and underarms) for at least 48 hours prior to  first CHG shower. It is OK to shave your face.  Please follow these instructions carefully.   1. Shower the NIGHT BEFORE SURGERY and the MORNING OF SURGERY with CHG.   2. If you chose to wash your hair, wash your hair first as usual with your normal shampoo.  3. After you shampoo, rinse your hair and body thoroughly to remove the shampoo.  4. Use CHG as you would any other liquid soap. You can apply CHG directly to the skin and wash gently with a scrungie or a clean washcloth.   5. Apply the CHG Soap to your body ONLY FROM THE NECK DOWN.  Do not use on open wounds or open sores. Avoid contact with your eyes, ears, mouth and genitals (private parts). Wash genitals (private parts) with your normal soap.  6. Wash thoroughly, paying special attention to the area where your surgery will be performed.  7. Thoroughly rinse your body with warm water from the neck down.  8. DO NOT shower/wash with your normal soap after using and rinsing off the CHG Soap.  9. Pat yourself dry with a CLEAN TOWEL.   10. Wear CLEAN PAJAMAS   11. Place CLEAN SHEETS on your bed the night of your first shower and DO NOT SLEEP WITH PETS.    Day of Surgery: Do not apply any deodorants/lotions. Please wear clean clothes to the hospital/surgery center.     Please read  over the following fact sheets that you were given. Pain Booklet, Coughing and Deep Breathing and Surgical Site Infection Prevention

## 2017-01-23 ENCOUNTER — Encounter (HOSPITAL_COMMUNITY)
Admission: RE | Admit: 2017-01-23 | Discharge: 2017-01-23 | Disposition: A | Discharge status: Home / Self Care | Payer: PRIVATE HEALTH INSURANCE | Source: Ambulatory Visit | Attending: General Surgery | Admitting: General Surgery

## 2017-01-23 ENCOUNTER — Ambulatory Visit
Admission: RE | Admit: 2017-01-23 | Discharge: 2017-01-23 | Disposition: A | Discharge status: Home / Self Care | Payer: PRIVATE HEALTH INSURANCE | Source: Ambulatory Visit | Attending: General Surgery | Admitting: General Surgery

## 2017-01-23 ENCOUNTER — Encounter (HOSPITAL_COMMUNITY): Payer: Self-pay

## 2017-01-23 DIAGNOSIS — C50511 Malignant neoplasm of lower-outer quadrant of right female breast: Secondary | ICD-10-CM | POA: Insufficient documentation

## 2017-01-23 DIAGNOSIS — N63 Unspecified lump in unspecified breast: Secondary | ICD-10-CM

## 2017-01-23 DIAGNOSIS — Z170 Estrogen receptor positive status [ER+]: Secondary | ICD-10-CM | POA: Insufficient documentation

## 2017-01-23 LAB — CBC
HEMATOCRIT: 39.9 % (ref 36.0–46.0)
HEMOGLOBIN: 12.7 g/dL (ref 12.0–15.0)
MCH: 29.1 pg (ref 26.0–34.0)
MCHC: 31.8 g/dL (ref 30.0–36.0)
MCV: 91.5 fL (ref 78.0–100.0)
Platelets: 241 10*3/uL (ref 150–400)
RBC: 4.36 MIL/uL (ref 3.87–5.11)
RDW: 12.6 % (ref 11.5–15.5)
WBC: 4.5 10*3/uL (ref 4.0–10.5)

## 2017-01-23 LAB — BASIC METABOLIC PANEL
ANION GAP: 7 (ref 5–15)
BUN: 9 mg/dL (ref 6–20)
CHLORIDE: 105 mmol/L (ref 101–111)
CO2: 25 mmol/L (ref 22–32)
Calcium: 9.2 mg/dL (ref 8.9–10.3)
Creatinine, Ser: 0.92 mg/dL (ref 0.44–1.00)
GFR calc non Af Amer: >60 mL/min (ref >60)
Glucose, Bld: 95 mg/dL (ref 65–99)
Potassium: 3.5 mmol/L (ref 3.5–5.1)
Sodium: 137 mmol/L (ref 135–145)

## 2017-01-23 MED ORDER — GADOBENATE DIMEGLUMINE 529 MG/ML IV SOLN
11.0000 mL | Freq: Once | INTRAVENOUS | Status: AC | PRN
  Administered 2017-01-23: 11 mL via INTRAVENOUS

## 2017-01-23 NOTE — Progress Notes (Signed)
PCP is Bradly Bienenstock, NP Dr Posey Pronto is OB, GYN Denies ever seeing a cardiologist. Denies any chest pain, fever, or cough.  Denies ever having a card cath, stress test, or echo. `

## 2017-01-25 ENCOUNTER — Ambulatory Visit (HOSPITAL_COMMUNITY): Payer: PRIVATE HEALTH INSURANCE

## 2017-01-28 ENCOUNTER — Ambulatory Visit (HOSPITAL_COMMUNITY)
Admission: RE | Admit: 2017-01-28 | Discharge: 2017-01-29 | Disposition: A | Discharge status: Home / Self Care | Payer: PRIVATE HEALTH INSURANCE | Source: Ambulatory Visit | Attending: General Surgery | Admitting: General Surgery

## 2017-01-28 ENCOUNTER — Encounter (HOSPITAL_COMMUNITY)
Admission: RE | Admit: 2017-01-28 | Discharge: 2017-01-28 | Disposition: A | Discharge status: Home / Self Care | Payer: PRIVATE HEALTH INSURANCE | Source: Ambulatory Visit | Attending: General Surgery | Admitting: General Surgery

## 2017-01-28 ENCOUNTER — Ambulatory Visit (HOSPITAL_COMMUNITY): Payer: PRIVATE HEALTH INSURANCE | Admitting: Anesthesiology

## 2017-01-28 ENCOUNTER — Encounter (HOSPITAL_COMMUNITY): Payer: Self-pay | Admitting: *Deleted

## 2017-01-28 ENCOUNTER — Encounter (HOSPITAL_COMMUNITY)
Admission: RE | Disposition: A | Discharge status: Home / Self Care | Payer: Self-pay | Source: Ambulatory Visit | Attending: General Surgery

## 2017-01-28 DIAGNOSIS — N6082 Other benign mammary dysplasias of left breast: Secondary | ICD-10-CM | POA: Insufficient documentation

## 2017-01-28 DIAGNOSIS — Z820 Family history of epilepsy and other diseases of the nervous system: Secondary | ICD-10-CM | POA: Insufficient documentation

## 2017-01-28 DIAGNOSIS — C773 Secondary and unspecified malignant neoplasm of axilla and upper limb lymph nodes: Secondary | ICD-10-CM | POA: Diagnosis not present

## 2017-01-28 DIAGNOSIS — Z79899 Other long term (current) drug therapy: Secondary | ICD-10-CM | POA: Diagnosis not present

## 2017-01-28 DIAGNOSIS — Z170 Estrogen receptor positive status [ER+]: Principal | ICD-10-CM

## 2017-01-28 DIAGNOSIS — Z7982 Long term (current) use of aspirin: Secondary | ICD-10-CM | POA: Insufficient documentation

## 2017-01-28 DIAGNOSIS — D242 Benign neoplasm of left breast: Secondary | ICD-10-CM | POA: Insufficient documentation

## 2017-01-28 DIAGNOSIS — C50911 Malignant neoplasm of unspecified site of right female breast: Secondary | ICD-10-CM | POA: Insufficient documentation

## 2017-01-28 DIAGNOSIS — Z8249 Family history of ischemic heart disease and other diseases of the circulatory system: Secondary | ICD-10-CM | POA: Insufficient documentation

## 2017-01-28 DIAGNOSIS — Z823 Family history of stroke: Secondary | ICD-10-CM | POA: Insufficient documentation

## 2017-01-28 DIAGNOSIS — Z79810 Long term (current) use of selective estrogen receptor modulators (SERMs): Secondary | ICD-10-CM | POA: Insufficient documentation

## 2017-01-28 DIAGNOSIS — N6012 Diffuse cystic mastopathy of left breast: Secondary | ICD-10-CM | POA: Diagnosis not present

## 2017-01-28 DIAGNOSIS — E7800 Pure hypercholesterolemia, unspecified: Secondary | ICD-10-CM | POA: Insufficient documentation

## 2017-01-28 DIAGNOSIS — Z833 Family history of diabetes mellitus: Secondary | ICD-10-CM | POA: Insufficient documentation

## 2017-01-28 DIAGNOSIS — C50111 Malignant neoplasm of central portion of right female breast: Secondary | ICD-10-CM | POA: Diagnosis present

## 2017-01-28 DIAGNOSIS — Z8349 Family history of other endocrine, nutritional and metabolic diseases: Secondary | ICD-10-CM | POA: Diagnosis not present

## 2017-01-28 DIAGNOSIS — C50511 Malignant neoplasm of lower-outer quadrant of right female breast: Secondary | ICD-10-CM

## 2017-01-28 HISTORY — PX: MASTECTOMY W/ SENTINEL NODE BIOPSY: SHX2001

## 2017-01-28 SURGERY — MASTECTOMY WITH SENTINEL LYMPH NODE BIOPSY
Anesthesia: General | Site: Breast | Laterality: Bilateral

## 2017-01-28 MED ORDER — LIDOCAINE 2% (20 MG/ML) 5 ML SYRINGE
INTRAMUSCULAR | Status: AC
  Filled 2017-01-28: qty 5

## 2017-01-28 MED ORDER — PROPOFOL 10 MG/ML IV BOLUS
INTRAVENOUS | Status: AC
  Filled 2017-01-28: qty 20

## 2017-01-28 MED ORDER — 0.9 % SODIUM CHLORIDE (POUR BTL) OPTIME
TOPICAL | Status: DC | PRN
  Administered 2017-01-28 (×2): 1000 mL

## 2017-01-28 MED ORDER — LACTATED RINGERS IV SOLN
INTRAVENOUS | Status: DC
  Administered 2017-01-28 (×2): via INTRAVENOUS

## 2017-01-28 MED ORDER — EPHEDRINE SULFATE-NACL 50-0.9 MG/10ML-% IV SOSY
PREFILLED_SYRINGE | INTRAVENOUS | Status: DC | PRN
  Administered 2017-01-28: 5 mg via INTRAVENOUS

## 2017-01-28 MED ORDER — OXYCODONE HCL 5 MG/5ML PO SOLN: 5.0000 mg | Freq: Once | ORAL | Status: DC | PRN

## 2017-01-28 MED ORDER — MORPHINE SULFATE (PF) 4 MG/ML IV SOLN: 1.0000 mg | INTRAVENOUS | Status: DC | PRN

## 2017-01-28 MED ORDER — CEFAZOLIN SODIUM-DEXTROSE 2-4 GM/100ML-% IV SOLN
2.0000 g | INTRAVENOUS | Status: AC
  Administered 2017-01-28: 2 g via INTRAVENOUS
  Filled 2017-01-28: qty 100

## 2017-01-28 MED ORDER — MIDAZOLAM HCL 2 MG/2ML IJ SOLN
INTRAMUSCULAR | Status: AC
  Filled 2017-01-28: qty 2

## 2017-01-28 MED ORDER — ONDANSETRON HCL 4 MG/2ML IJ SOLN: 4.0000 mg | Freq: Once | INTRAMUSCULAR | Status: DC | PRN

## 2017-01-28 MED ORDER — FENTANYL CITRATE (PF) 100 MCG/2ML IJ SOLN
INTRAMUSCULAR | Status: AC
  Administered 2017-01-28: 100 ug via INTRAVENOUS
  Filled 2017-01-28: qty 2

## 2017-01-28 MED ORDER — DEXAMETHASONE SODIUM PHOSPHATE 10 MG/ML IJ SOLN
INTRAMUSCULAR | Status: AC
  Filled 2017-01-28: qty 1

## 2017-01-28 MED ORDER — KCL IN DEXTROSE-NACL 20-5-0.9 MEQ/L-%-% IV SOLN
INTRAVENOUS | Status: DC
  Administered 2017-01-28: 16:00:00 via INTRAVENOUS
  Filled 2017-01-28 (×2): qty 1000

## 2017-01-28 MED ORDER — OXYCODONE HCL 5 MG PO TABS: 5.0000 mg | ORAL_TABLET | Freq: Once | ORAL | Status: DC | PRN

## 2017-01-28 MED ORDER — PANTOPRAZOLE SODIUM 40 MG IV SOLR
40.0000 mg | Freq: Every day | INTRAVENOUS | Status: DC
  Administered 2017-01-28: 40 mg via INTRAVENOUS
  Filled 2017-01-28: qty 40

## 2017-01-28 MED ORDER — CHLORHEXIDINE GLUCONATE CLOTH 2 % EX PADS: 6.0000 | MEDICATED_PAD | Freq: Once | CUTANEOUS | Status: DC

## 2017-01-28 MED ORDER — EPHEDRINE 5 MG/ML INJ
INTRAVENOUS | Status: AC
  Filled 2017-01-28: qty 10

## 2017-01-28 MED ORDER — ONDANSETRON HCL 4 MG/2ML IJ SOLN
INTRAMUSCULAR | Status: AC
  Filled 2017-01-28: qty 2

## 2017-01-28 MED ORDER — LIDOCAINE 2% (20 MG/ML) 5 ML SYRINGE
INTRAMUSCULAR | Status: DC | PRN
  Administered 2017-01-28: 50 mg via INTRAVENOUS

## 2017-01-28 MED ORDER — METHOCARBAMOL 500 MG PO TABS
ORAL_TABLET | ORAL | Status: AC
  Filled 2017-01-28: qty 1

## 2017-01-28 MED ORDER — HEPARIN SODIUM (PORCINE) 5000 UNIT/ML IJ SOLN
5000.0000 [IU] | Freq: Three times a day (TID) | INTRAMUSCULAR | Status: DC
  Administered 2017-01-29: 5000 [IU] via SUBCUTANEOUS
  Filled 2017-01-28: qty 1

## 2017-01-28 MED ORDER — FENTANYL CITRATE (PF) 100 MCG/2ML IJ SOLN: 25.0000 ug | INTRAMUSCULAR | Status: DC | PRN

## 2017-01-28 MED ORDER — DEXAMETHASONE SODIUM PHOSPHATE 10 MG/ML IJ SOLN
INTRAMUSCULAR | Status: DC | PRN
  Administered 2017-01-28: 10 mg via INTRAVENOUS

## 2017-01-28 MED ORDER — ONDANSETRON HCL 4 MG/2ML IJ SOLN: 4.0000 mg | Freq: Four times a day (QID) | INTRAMUSCULAR | Status: DC | PRN

## 2017-01-28 MED ORDER — ONDANSETRON 4 MG PO TBDP: 4.0000 mg | ORAL_TABLET | Freq: Four times a day (QID) | ORAL | Status: DC | PRN

## 2017-01-28 MED ORDER — HYDROCODONE-ACETAMINOPHEN 5-325 MG PO TABS
1.0000 | ORAL_TABLET | ORAL | Status: DC | PRN
  Administered 2017-01-28 – 2017-01-29 (×5): 2 via ORAL
  Filled 2017-01-28 (×5): qty 2

## 2017-01-28 MED ORDER — MIDAZOLAM HCL 2 MG/2ML IJ SOLN
INTRAMUSCULAR | Status: AC
  Administered 2017-01-28: 2 mg via INTRAVENOUS
  Filled 2017-01-28: qty 2

## 2017-01-28 MED ORDER — FENTANYL CITRATE (PF) 100 MCG/2ML IJ SOLN
INTRAMUSCULAR | Status: DC | PRN
  Administered 2017-01-28 (×2): 25 ug via INTRAVENOUS

## 2017-01-28 MED ORDER — METHOCARBAMOL 500 MG PO TABS
500.0000 mg | ORAL_TABLET | Freq: Four times a day (QID) | ORAL | Status: DC | PRN
  Administered 2017-01-28 – 2017-01-29 (×3): 500 mg via ORAL
  Filled 2017-01-28 (×3): qty 1

## 2017-01-28 MED ORDER — ONDANSETRON HCL 4 MG/2ML IJ SOLN
INTRAMUSCULAR | Status: DC | PRN
  Administered 2017-01-28: 4 mg via INTRAVENOUS

## 2017-01-28 MED ORDER — PHENYLEPHRINE HCL 10 MG/ML IJ SOLN
INTRAMUSCULAR | Status: DC | PRN
  Administered 2017-01-28 (×4): 80 ug via INTRAVENOUS

## 2017-01-28 MED ORDER — TAMOXIFEN CITRATE 10 MG PO TABS
20.0000 mg | ORAL_TABLET | Freq: Every day | ORAL | Status: DC
  Administered 2017-01-29: 20 mg via ORAL
  Filled 2017-01-28: qty 2

## 2017-01-28 MED ORDER — MIDAZOLAM HCL 5 MG/5ML IJ SOLN
INTRAMUSCULAR | Status: DC | PRN
  Administered 2017-01-28: 2 mg via INTRAVENOUS

## 2017-01-28 MED ORDER — MORPHINE SULFATE (PF) 2 MG/ML IV SOLN: 1.0000 mg | INTRAVENOUS | Status: DC | PRN

## 2017-01-28 MED ORDER — CELECOXIB 200 MG PO CAPS
400.0000 mg | ORAL_CAPSULE | ORAL | Status: AC
  Administered 2017-01-28: 400 mg via ORAL
  Filled 2017-01-28: qty 2

## 2017-01-28 MED ORDER — MIDAZOLAM HCL 2 MG/2ML IJ SOLN
2.0000 mg | Freq: Once | INTRAMUSCULAR | Status: AC
  Administered 2017-01-28: 2 mg via INTRAVENOUS

## 2017-01-28 MED ORDER — PROPOFOL 10 MG/ML IV BOLUS
INTRAVENOUS | Status: DC | PRN
  Administered 2017-01-28: 150 mg via INTRAVENOUS

## 2017-01-28 MED ORDER — FENTANYL CITRATE (PF) 100 MCG/2ML IJ SOLN
100.0000 ug | Freq: Once | INTRAMUSCULAR | Status: AC
  Administered 2017-01-28: 100 ug via INTRAVENOUS

## 2017-01-28 MED ORDER — FENTANYL CITRATE (PF) 250 MCG/5ML IJ SOLN
INTRAMUSCULAR | Status: AC
  Filled 2017-01-28: qty 5

## 2017-01-28 MED ORDER — PHENYLEPHRINE 40 MCG/ML (10ML) SYRINGE FOR IV PUSH (FOR BLOOD PRESSURE SUPPORT)
PREFILLED_SYRINGE | INTRAVENOUS | Status: AC
  Filled 2017-01-28: qty 20

## 2017-01-28 MED ORDER — ACETAMINOPHEN 500 MG PO TABS
1000.0000 mg | ORAL_TABLET | ORAL | Status: AC
  Administered 2017-01-28: 1000 mg via ORAL
  Filled 2017-01-28: qty 2

## 2017-01-28 MED ORDER — HYDROCODONE-ACETAMINOPHEN 5-325 MG PO TABS
ORAL_TABLET | ORAL | Status: AC
  Filled 2017-01-28: qty 2

## 2017-01-28 MED ORDER — GABAPENTIN 300 MG PO CAPS
300.0000 mg | ORAL_CAPSULE | ORAL | Status: AC
  Administered 2017-01-28: 300 mg via ORAL
  Filled 2017-01-28: qty 1

## 2017-01-28 MED ORDER — TECHNETIUM TC 99M SULFUR COLLOID FILTERED
1.0000 | Freq: Once | INTRAVENOUS | Status: AC | PRN
  Administered 2017-01-28: 1 via INTRADERMAL

## 2017-01-28 MED ORDER — PHENYLEPHRINE HCL 10 MG/ML IJ SOLN
INTRAVENOUS | Status: DC | PRN
  Administered 2017-01-28: 10 ug/min via INTRAVENOUS

## 2017-01-28 SURGICAL SUPPLY — 42 items
APPLIER CLIP 9.375 MED OPEN (MISCELLANEOUS) ×4
BINDER BREAST LRG (GAUZE/BANDAGES/DRESSINGS) ×2 IMPLANT
BIOPATCH RED 1 DISK 7.0 (GAUZE/BANDAGES/DRESSINGS) ×6 IMPLANT
CANISTER SUCT 3000ML PPV (MISCELLANEOUS) ×4 IMPLANT
CHLORAPREP W/TINT 26ML (MISCELLANEOUS) ×4 IMPLANT
CLIP APPLIE 9.375 MED OPEN (MISCELLANEOUS) ×2 IMPLANT
CONT SPEC 4OZ CLIKSEAL STRL BL (MISCELLANEOUS) ×6 IMPLANT
COVER PROBE W GEL 5X96 (DRAPES) ×2 IMPLANT
COVER SURGICAL LIGHT HANDLE (MISCELLANEOUS) ×2 IMPLANT
DERMABOND ADVANCED (GAUZE/BANDAGES/DRESSINGS) ×2
DERMABOND ADVANCED .7 DNX12 (GAUZE/BANDAGES/DRESSINGS) ×2 IMPLANT
DEVICE DISSECT PLASMABLAD 3.0S (MISCELLANEOUS) ×1 IMPLANT
DRAIN CHANNEL 19F RND (DRAIN) ×4 IMPLANT
DRAPE CHEST BREAST 15X10 FENES (DRAPES) ×2 IMPLANT
DRSG TEGADERM 4X4.75 (GAUZE/BANDAGES/DRESSINGS) ×4 IMPLANT
ELECT CAUTERY BLADE 6.4 (BLADE) ×2 IMPLANT
ELECT REM PT RETURN 9FT ADLT (ELECTROSURGICAL) ×4
ELECTRODE REM PT RTRN 9FT ADLT (ELECTROSURGICAL) ×2 IMPLANT
EVACUATOR SILICONE 100CC (DRAIN) ×4 IMPLANT
GAUZE SPONGE 4X4 12PLY STRL (GAUZE/BANDAGES/DRESSINGS) ×2 IMPLANT
GLOVE BIO SURGEON STRL SZ7 (GLOVE) ×2 IMPLANT
GLOVE BIO SURGEON STRL SZ7.5 (GLOVE) ×2 IMPLANT
GLOVE BIOGEL PI IND STRL 7.0 (GLOVE) ×3 IMPLANT
GLOVE BIOGEL PI INDICATOR 7.0 (GLOVE) ×3
GLOVE SURG SS PI 6.5 STRL IVOR (GLOVE) ×2 IMPLANT
GOWN STRL REUS W/ TWL LRG LVL3 (GOWN DISPOSABLE) ×3 IMPLANT
GOWN STRL REUS W/TWL LRG LVL3 (GOWN DISPOSABLE) ×3
KIT BASIN OR (CUSTOM PROCEDURE TRAY) ×2 IMPLANT
KIT ROOM TURNOVER OR (KITS) ×2 IMPLANT
LIGHT WAVEGUIDE WIDE FLAT (MISCELLANEOUS) ×2 IMPLANT
NS IRRIG 1000ML POUR BTL (IV SOLUTION) ×4 IMPLANT
PACK GENERAL/GYN (CUSTOM PROCEDURE TRAY) ×2 IMPLANT
PAD ABD 8X10 STRL (GAUZE/BANDAGES/DRESSINGS) ×4 IMPLANT
PAD ARMBOARD 7.5X6 YLW CONV (MISCELLANEOUS) ×2 IMPLANT
PLASMABLADE 3.0S (MISCELLANEOUS) ×2
SUT ETHILON 3 0 FSL (SUTURE) ×4 IMPLANT
SUT MNCRL AB 4-0 PS2 18 (SUTURE) ×4 IMPLANT
SUT VIC AB 3-0 SH 18 (SUTURE) ×4 IMPLANT
SYR CONTROL 10ML LL (SYRINGE) IMPLANT
TOWEL OR 17X24 6PK STRL BLUE (TOWEL DISPOSABLE) ×2 IMPLANT
TOWEL OR 17X26 10 PK STRL BLUE (TOWEL DISPOSABLE) ×2 IMPLANT
TUBE CONNECTING 12X1/4 (SUCTIONS) IMPLANT

## 2017-01-28 NOTE — Transfer of Care (Signed)
Immediate Anesthesia Transfer of Care Note  Patient: Angelica Floyd  Procedure(s) Performed: Procedure(s): BILATERAL MASTECTOMY WITH RIGHT SENTINEL LYMPH NODE BIOPSY (Bilateral)  Patient Location: PACU  Anesthesia Type:GA combined with regional for post-op pain  Level of Consciousness: awake, alert  and oriented  Airway & Oxygen Therapy: Patient Spontanous Breathing and Patient connected to nasal cannula oxygen  Post-op Assessment: Report given to RN, Post -op Vital signs reviewed and stable and Patient moving all extremities X 4  Post vital signs: Reviewed and stable  Last Vitals:  Vitals:   01/28/17 0940 01/28/17 1321  BP: (!) 145/81   Pulse: 93   Resp: 16 14  Temp:    SpO2: 100%     Last Pain:  Vitals:   01/28/17 0834  TempSrc: Oral      Patients Stated Pain Goal: 7 (87/86/76 7209)  Complications: No apparent anesthesia complications

## 2017-01-28 NOTE — Anesthesia Procedure Notes (Signed)
Procedure Name: LMA Insertion Date/Time: 01/28/2017 10:24 AM Performed by: Rush Farmer E Pre-anesthesia Checklist: Patient identified, Emergency Drugs available, Suction available and Patient being monitored Patient Re-evaluated:Patient Re-evaluated prior to induction Oxygen Delivery Method: Circle system utilized Preoxygenation: Pre-oxygenation with 100% oxygen Induction Type: IV induction LMA: LMA inserted LMA Size: 4.0 Number of attempts: 1 Placement Confirmation: positive ETCO2 and breath sounds checked- equal and bilateral Tube secured with: Tape Dental Injury: Teeth and Oropharynx as per pre-operative assessment

## 2017-01-28 NOTE — Interval H&P Note (Signed)
History and Physical Interval Note:  01/28/2017 9:52 AM  Angelica Floyd  has presented today for surgery, with the diagnosis of RIGHT BREAST CANCER  The various methods of treatment have been discussed with the patient and family. After consideration of risks, benefits and other options for treatment, the patient has consented to  Procedure(s): BILATERAL MASTECTOMY WITH RIGHT SENTINEL LYMPH NODE BIOPSY (Bilateral) as a surgical intervention .  The patient's history has been reviewed, patient examined, no change in status, stable for surgery.  I have reviewed the patient's chart and labs.  Questions were answered to the patient's satisfaction.     TOTH III,Terilyn Sano S

## 2017-01-28 NOTE — Anesthesia Postprocedure Evaluation (Signed)
Anesthesia Post Note  Patient: Angelica Floyd  Procedure(s) Performed: Procedure(s) (LRB): BILATERAL MASTECTOMY WITH RIGHT SENTINEL LYMPH NODE BIOPSY (Bilateral)     Patient location during evaluation: PACU Anesthesia Type: General Level of consciousness: awake, awake and alert and oriented Pain management: pain level controlled Vital Signs Assessment: post-procedure vital signs reviewed and stable Respiratory status: spontaneous breathing, nonlabored ventilation and respiratory function stable Cardiovascular status: blood pressure returned to baseline Anesthetic complications: no    Last Vitals:  Vitals:   01/28/17 1435 01/28/17 1450  BP: 103/72   Pulse: 86   Resp: 13   Temp:  36.6 C  SpO2: 99%     Last Pain:  Vitals:   01/28/17 1450  TempSrc:   PainSc: 3                  Laney Bagshaw COKER

## 2017-01-28 NOTE — Anesthesia Preprocedure Evaluation (Addendum)
Anesthesia Evaluation  Patient identified by MRN, date of birth, ID band Patient awake    Reviewed: Allergy & Precautions, NPO status , Patient's Chart, lab work & pertinent test results  Airway Mallampati: II  TM Distance: >3 FB Neck ROM: Full    Dental  (+) Teeth Intact, Dental Advisory Given   Pulmonary    breath sounds clear to auscultation       Cardiovascular  Rhythm:Regular Rate:Normal     Neuro/Psych    GI/Hepatic   Endo/Other    Renal/GU      Musculoskeletal   Abdominal   Peds  Hematology   Anesthesia Other Findings   Reproductive/Obstetrics                             Anesthesia Physical Anesthesia Plan  ASA: II  Anesthesia Plan: General   Post-op Pain Management:  Regional for Post-op pain   Induction: Intravenous  PONV Risk Score and Plan: Dexamethasone and Ondansetron  Airway Management Planned: LMA  Additional Equipment:   Intra-op Plan:   Post-operative Plan:   Informed Consent: I have reviewed the patients History and Physical, chart, labs and discussed the procedure including the risks, benefits and alternatives for the proposed anesthesia with the patient or authorized representative who has indicated his/her understanding and acceptance.   Dental advisory given  Plan Discussed with: CRNA and Anesthesiologist  Anesthesia Plan Comments:         Anesthesia Quick Evaluation

## 2017-01-28 NOTE — H&P (Addendum)
Angelica Floyd  Location: [facility] Surgery Patient #: [id number] DOB: May 30, 1965 Undefined / Language: [name] / Race: White Female   History of Present Illness The patient is a 51 year old female who presents with breast cancer. we are asked to see the patient in consultation by Dr. Sondra Come to evaluate her for a new right breast cancer. The patient is a 51 year old white female who presents with a palpable mass in the right breast. The imaging showed a 3.6 cm mass in the lower outer right breast as well as a 1.4 cm area of calcification in the upper inner breast. The upper area was biopsied and came back as atypia. 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 The axilla looked negative. The cancer was ER and PR positive and HER-2 negative with a Ki-67 of 3%. She does not smoke. She denies any hormone replacement.   Past Surgical History  Appendectomy  Breast Biopsy  Right. Cesarean Section - 1  Hysterectomy (not due to cancer) - Partial  Oral Surgery  Tonsillectomy   Diagnostic Studies History Colonoscopy  never Mammogram  within last year Pap Smear  >5 years ago  Medication History  No Current Medications Medications Reconciled  Social History  Alcohol use  Moderate alcohol use. Caffeine use  Carbonated beverages. No drug use  Tobacco use  Never smoker.  Family History Cerebrovascular Accident  Family Members In General, Father. Diabetes Mellitus  Mother. Heart Disease  Mother. Hypertension  Father, Mother. Migraine Headache  Sister. Respiratory Condition  Father. Thyroid problems  Mother.  Pregnancy / Birth History Age at menarche  56 years. Age of menopause  44-50 Gravida  2 Length (months) of breastfeeding  7-12 Maternal age  10-35 Para  2  Other Problems  Anxiety Disorder  Breast Cancer  Hemorrhoids  Hypercholesterolemia  Lump In Breast     Review of Systems  General Present- Weight Loss.  Not Present- Appetite Loss, Chills, Fatigue, Fever, Night Sweats and Weight Gain. Skin Not Present- Change in Wart/Mole, Dryness, Hives, Jaundice, New Lesions, Non-Healing Wounds, Rash and Ulcer. HEENT Present- Wears glasses/contact lenses. Not Present- Earache, Hearing Loss, Hoarseness, Nose Bleed, Oral Ulcers, Ringing in the Ears, Seasonal Allergies, Sinus Pain, Sore Throat, Visual Disturbances and Yellow Eyes. Respiratory Present- Snoring. Not Present- Bloody sputum, Chronic Cough, Difficulty Breathing and Wheezing. Breast Present- Breast Mass and Breast Pain. Not Present- Nipple Discharge and Skin Changes. Cardiovascular Not Present- Chest Pain, Difficulty Breathing Lying Down, Leg Cramps, Palpitations, Rapid Heart Rate, Shortness of Breath and Swelling of Extremities. Gastrointestinal Present- Change in Bowel Habits and Hemorrhoids. Not Present- Abdominal Pain, Bloating, Bloody Stool, Chronic diarrhea, Constipation, Difficulty Swallowing, Excessive gas, Gets full quickly at meals, Indigestion, Nausea, Rectal Pain and Vomiting. Female Genitourinary Not Present- Frequency, Nocturia, Painful Urination, Pelvic Pain and Urgency. Musculoskeletal Not Present- Back Pain, Joint Pain, Joint Stiffness, Muscle Pain, Muscle Weakness and Swelling of Extremities. Neurological Not Present- Decreased Memory, Fainting, Headaches, Numbness, Seizures, Tingling, Tremor, Trouble walking and Weakness. Psychiatric Present- Change in Sleep Pattern. Not Present- Anxiety, Bipolar, Depression, Fearful and Frequent crying. Endocrine Not Present- Cold Intolerance, Excessive Hunger, Hair Changes, Heat Intolerance, Hot flashes and New Diabetes. Hematology Not Present- Blood Thinners, Easy Bruising, Excessive bleeding, Gland problems, HIV and Persistent Infections.   Physical Exam General Mental Status-Alert. General Appearance-Consistent with stated age. Hydration-Well hydrated. Voice-Normal.  Head and  Neck Head-normocephalic, atraumatic with no lesions or palpable masses. Trachea-midline. Thyroid Gland Characteristics - normal size and consistency.  Eye Eyeball - Bilateral-Extraocular movements intact. Sclera/Conjunctiva - Bilateral-No scleral icterus.  Chest and Lung Exam Chest and lung exam reveals -quiet, even and easy respiratory effort with no use of accessory muscles and on auscultation, normal breath sounds, no adventitious sounds and normal vocal resonance. Inspection Chest Wall - Normal. Back - normal.  Breast Note: there is a 3-4 cm palpable mass in the lower outer right breast as well as a palpable bruise in the upper inner right breast. There is no palpable mass in the left breast. There is no palpable axillary, supraclavicular, or cervical lymphadenopathy   Cardiovascular Cardiovascular examination reveals -normal heart sounds, regular rate and rhythm with no murmurs and normal pedal pulses bilaterally.  Abdomen Inspection Inspection of the abdomen reveals - No Hernias. Skin - Scar - no surgical scars. Palpation/Percussion Palpation and Percussion of the abdomen reveal - Soft, Non Tender, No Rebound tenderness, No Rigidity (guarding) and No hepatosplenomegaly. Auscultation Auscultation of the abdomen reveals - Bowel sounds normal.  Neurologic Neurologic evaluation reveals -alert and oriented x 3 with no impairment of recent or remote memory. Mental Status-Normal.  Musculoskeletal Normal Exam - Left-Upper Extremity Strength Normal and Lower Extremity Strength Normal. Normal Exam - Right-Upper Extremity Strength Normal and Lower Extremity Strength Normal.  Lymphatic Head & Neck  General Head & Neck Lymphatics: Bilateral - Description - Normal. Axillary  General Axillary Region: Bilateral - Description - Normal. Tenderness - Non Tender. Femoral & Inguinal  Generalized Femoral & Inguinal Lymphatics: Bilateral - Description - Normal.  Tenderness - Non Tender.    Assessment & Plan  MALIGNANT NEOPLASM OF LOWER-OUTER QUADRANT OF RIGHT BREAST OF FEMALE, ESTROGEN RECEPTOR POSITIVE (C50.511) Impression: the patient appears to have a large invasive lobular cancer in the lower outer right breast with a relatively small breast size and a second area of atypia. I have talked to her about the options for treatment and at this point she favors mastectomy with sentinel node mapping. Because of the proximity to the nipple she would not be a good candidate for nipple sparing mastectomy. She is a candidate though for immediate reconstruction. I will refer her to plastic surgery and begin surgical planning. I have discussed with her in detail the risks and benefits of the operation as well as some of the technical aspects and she understands and wishes to proceed Current Plans Referred to Surgery - Plastic, for evaluation and follow up (Plastic Surgery). Routine.   She has elected for bilateral mastectomies and right sentinel node mapping with no reconstruction. I have discussed with her in detail the risks and benefits of the surgery as well as some of the technical aspects and she understands and wishes to proceed

## 2017-01-28 NOTE — Progress Notes (Signed)
Dr. Marlou Starks stated to put IV in left arm.

## 2017-01-28 NOTE — Anesthesia Procedure Notes (Signed)
Anesthesia Regional Block: Pectoralis block   Pre-Anesthetic Checklist: ,, timeout performed, Correct Patient, Correct Site, Correct Laterality, Correct Procedure, Correct Position, site marked, Risks and benefits discussed,  Surgical consent,  Pre-op evaluation,  At surgeon's request and post-op pain management  Laterality: Left  Prep: chloraprep       Needles:  Injection technique: Single-shot  Needle Type: Echogenic Stimulator Needle     Needle Length: 9cm  Needle Gauge: 21     Additional Needles:   Procedures:,,,, ultrasound used (permanent image in chart),,,,  Narrative:  Start time: 01/28/2017 9:10 AM End time: 01/28/2017 9:15 AM Injection made incrementally with aspirations every 5 mL.  Performed by: Personally   Additional Notes: 20 cc 0.75% Ropivacaine injected easily

## 2017-01-28 NOTE — Anesthesia Procedure Notes (Signed)
Anesthesia Regional Block: Pectoralis block   Pre-Anesthetic Checklist: ,, timeout performed, Correct Patient, Correct Site, Correct Laterality, Correct Procedure, Correct Position, site marked, Risks and benefits discussed,  Surgical consent,  Pre-op evaluation,  At surgeon's request and post-op pain management  Laterality: Right  Prep: chloraprep       Needles:   Needle Type: Echogenic Stimulator Needle     Needle Length: 9cm  Needle Gauge: 21     Additional Needles:   Procedures:,,,, ultrasound used (permanent image in chart),,,,  Narrative:  Start time: 01/28/2017 9:10 AM Injection made incrementally with aspirations every 5 mL.  Performed by: Personally  Anesthesiologist: Sricharan Lacomb  Additional Notes: 20 cc 0.75% Ropivacaine injected easily

## 2017-01-28 NOTE — Op Note (Signed)
01/28/2017  1:06 PM  PATIENT:  Angelica Floyd  51 y.o. female  PRE-OPERATIVE DIAGNOSIS:  RIGHT BREAST CANCER  POST-OPERATIVE DIAGNOSIS:  RIGHT BREAST CANCER  PROCEDURE:  Procedure(s): RIGHT MASTECTOMY WITH RIGHT DEEP AXILLARY SENTINEL LYMPH NODE BIOPSY AND LEFT PROPHYLACTIC MASTECTOMY  SURGEON:  Surgeon(s) and Role:    * Jovita Kussmaul, MD - Primary  PHYSICIAN ASSISTANT:   ASSISTANTS: none   ANESTHESIA:   general  EBL:  Total I/O In: 1000 [I.V.:1000] Out: -   BLOOD ADMINISTERED:none  DRAINS: (3) Jackson-Pratt drain(s) with closed bulb suction in the prepectoral space   LOCAL MEDICATIONS USED:  NONE  SPECIMEN:  Source of Specimen:  right mastectomy and sentinel nodes X 3 and left mastectomy  DISPOSITION OF SPECIMEN:  PATHOLOGY  COUNTS:  YES  TOURNIQUET:  * No tourniquets in log *  DICTATION: .Dragon Dictation   After informed consent was obtained the patient was brought to the operating room and placed in the supine position on the operating room table. After adequate induction of general anesthesia the patient's bilateral chest, breast, and axillary areas were prepped with ChloraPrep, allowed to dry, and draped in usual sterile manner. An appropriate timeout was performed. Earlier in the day the patient underwent injection of 1 mCi of technetium sulfur colloid in the subareolar position on the right. Attention was then turned to the left breast. An elliptical incision was made around the nipple and areola complex in order to minimize the excess skin. The incision was carried through the skin and subcutaneous tissue sharply with the plasma blade. Breast hooks were used to elevate the skin flaps anteriorly towards the ceiling. Thin skin flaps were created circumferentially between the breast tissue in the subcutaneous fat. This dissection was carried all the way to the chest wall. Next the breast was removed from the pectoralis muscle with the pectoralis fascia. Once the  breast was completely freed it was oriented with the stitch on the lateral skin and sent to pathology for further evaluation. Hemostasis was achieved using the plasma blade. The wound was irrigated with copious amounts of saline. A small stab incision was made near the anterior axillary line inferior to the operative area. A tonsil clamp was placed through this opening and used to bring a 19 [place] round Blake drain into the operative bed. The drain was anchored to the skin with a 3-0 nylon stitch. The drain was curled along the chest wall. The superior and inferior skin flaps were then grossly reapproximated with interrupted 3-0 Vicryl stitches. The skin was then closed with a running 4-0 Monocryl subcuticular stitch. The drain was placed to bulb suction was a good seal. Attention was then turned to the right breast. A similar elliptical incision was made around the nipple and areola complex in order to minimize the excess skin. The incision was carried through the skin and subcutaneous tissue sharply with the plasma blade. Breast hooks were used to elevate the skin flaps anteriorly towards the ceiling. Thin skin flaps were then created circumferentially between the breast tissue in the subcutaneous fat. This dissection was carried all the way to the chest wall. Along the mid portion of the inferior flap is where the tumor was very shallow but I do not think I could've made the skin flap any thinner without risk of compromising the blood flow to the skin. Next the breast was removed from the pectoralis muscle with the pectoralis fascia. Once the breast was free it was oriented with the stitch  on the lateral skin. The neoprobe was then used to direct sharp dissection with the plasma blade into the deep right axillary space. Blunt dissection with a hemostat was then carried out with the help of the neoprobe until I was able to identify 3 hot lymph nodes. These lymph nodes were very high in the axilla and care was  taken to tease these down away from the axillary vein without injuring that structure. Several lymphatics were controlled with clips. Ex vivo counts on these 3 sentinel nodes were approximately 200. No other hot or palpable lymph nodes were identified in the right axilla. Next 2 small stab incisions were made near the anterior axillary line inferior to the operative area. A tonsil was placed through each of these openings and used to bring 19 [place] round Blake drains into the operative bed. The drains were anchored to the skin with 3-0 nylon stitches. The lateral drain was placed in the axilla and the medial drain was curled along the chest wall. Hemostasis was achieved using the plasma blade. The wound was irrigated with copious amounts of saline. The superior and inferior skin flaps were then grossly reapproximated with interrupted 3-0 Vicryl stitches. The skin was then closed with a running 4-0 Monocryl subcuticular stitch. Dermabond dressings were applied. The drains were placed to bulb suction and there was a good seal. The patient tolerated the procedure well. At the end of the case all needle sponge and instrument counts were correct. The patient was then awakened and taken to recovery in stable condition.  PLAN OF CARE: Admit for overnight observation  PATIENT DISPOSITION:  PACU - hemodynamically stable.   Delay start of Pharmacological VTE agent (>24hrs) due to surgical blood loss or risk of bleeding: no

## 2017-01-29 ENCOUNTER — Encounter (HOSPITAL_COMMUNITY): Payer: Self-pay | Admitting: General Surgery

## 2017-01-29 DIAGNOSIS — C50911 Malignant neoplasm of unspecified site of right female breast: Secondary | ICD-10-CM | POA: Diagnosis not present

## 2017-01-29 MED ORDER — HYDROCODONE-ACETAMINOPHEN 5-325 MG PO TABS: 1.0000 | ORAL_TABLET | ORAL | 0 refills | Status: DC | PRN

## 2017-01-29 MED ORDER — METHOCARBAMOL 750 MG PO TABS: 750.0000 mg | ORAL_TABLET | Freq: Four times a day (QID) | ORAL | 1 refills | Status: DC | PRN

## 2017-01-29 NOTE — Progress Notes (Signed)
Discharge home. Home discharge instruction given, no question verbalized. 

## 2017-01-29 NOTE — Progress Notes (Signed)
1 Day Post-Op   Subjective/Chief Complaint: No complaints other than soreness   Objective: Vital signs in last 24 hours: Temp:  [97.6 F (36.4 C)-98.2 F (36.8 C)] 97.9 F (36.6 C) (09/18 0436) Pulse Rate:  [59-106] 59 (09/18 0436) Resp:  [12-20] 16 (09/18 0436) BP: (98-148)/(54-92) 100/57 (09/18 0436) SpO2:  [98 %-100 %] 99 % (09/18 0436) Weight:  [55.3 kg (122 lb)-55.4 kg (122 lb 2.2 oz)] 55.3 kg (122 lb) (09/18 0436) Last BM Date: 01/28/17  Intake/Output from previous day: 09/17 0701 - 09/18 0700 In: 1100 [I.V.:1100] Out: 1879 [Urine:1700; Drains:104; Blood:75] Intake/Output this shift: No intake/output data recorded.  General appearance: alert and cooperative Resp: clear to auscultation bilaterally Chest wall: skin flaps look good Cardio: regular rate and rhythm GI: soft, non-tender; bowel sounds normal; no masses,  no organomegaly  Lab Results:  No results for input(s): WBC, HGB, HCT, PLT in the last 72 hours. BMET No results for input(s): NA, K, CL, CO2, GLUCOSE, BUN, CREATININE, CALCIUM in the last 72 hours. PT/INR No results for input(s): LABPROT, INR in the last 72 hours. ABG No results for input(s): PHART, HCO3 in the last 72 hours.  Invalid input(s): PCO2, PO2  Studies/Results: No results found.  Anti-infectives: Anti-infectives    Start     Dose/Rate Route Frequency Ordered Stop   01/28/17 0824  ceFAZolin (ANCEF) IVPB 2g/100 mL premix     2 g 200 mL/hr over 30 Minutes Intravenous On call to O.R. 01/28/17 0824 01/28/17 1031      Assessment/Plan: s/p Procedure(s): BILATERAL MASTECTOMY WITH RIGHT SENTINEL LYMPH NODE BIOPSY (Bilateral) Advance diet  Discharge today  LOS: 0 days    TOTH III,Jeneane Pieczynski S 01/29/2017

## 2017-02-04 ENCOUNTER — Ambulatory Visit (HOSPITAL_BASED_OUTPATIENT_CLINIC_OR_DEPARTMENT_OTHER): Payer: PRIVATE HEALTH INSURANCE | Admitting: Hematology and Oncology

## 2017-02-04 ENCOUNTER — Other Ambulatory Visit: Payer: Self-pay | Admitting: *Deleted

## 2017-02-04 ENCOUNTER — Telehealth: Payer: Self-pay | Admitting: Hematology and Oncology

## 2017-02-04 VITALS — BP 144/73 | HR 95 | Temp 98.0°F | Resp 18 | Ht 63.0 in | Wt 121.4 lb

## 2017-02-04 DIAGNOSIS — Z170 Estrogen receptor positive status [ER+]: Secondary | ICD-10-CM

## 2017-02-04 DIAGNOSIS — C50511 Malignant neoplasm of lower-outer quadrant of right female breast: Secondary | ICD-10-CM

## 2017-02-04 DIAGNOSIS — C773 Secondary and unspecified malignant neoplasm of axilla and upper limb lymph nodes: Secondary | ICD-10-CM

## 2017-02-04 MED ORDER — LIDOCAINE-PRILOCAINE 2.5-2.5 % EX CREA: TOPICAL_CREAM | CUTANEOUS | 3 refills | Status: DC

## 2017-02-04 MED ORDER — ONDANSETRON HCL 8 MG PO TABS: 8.0000 mg | ORAL_TABLET | Freq: Two times a day (BID) | ORAL | 1 refills | Status: DC | PRN

## 2017-02-04 MED ORDER — DEXAMETHASONE 4 MG PO TABS: 4.0000 mg | ORAL_TABLET | Freq: Every day | ORAL | 0 refills | Status: DC

## 2017-02-04 MED ORDER — LORAZEPAM 0.5 MG PO TABS: 0.5000 mg | ORAL_TABLET | Freq: Every day | ORAL | 0 refills | Status: DC

## 2017-02-04 MED ORDER — PROCHLORPERAZINE MALEATE 10 MG PO TABS: 10.0000 mg | ORAL_TABLET | Freq: Four times a day (QID) | ORAL | 1 refills | Status: DC | PRN

## 2017-02-04 NOTE — Assessment & Plan Note (Signed)
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 I discussed the margins as well as lymph node surgeries. We also discussed the final staging along with previously performed ER/PR and HER-2/neu testing.  Recommendation: 1. Axillary lymph node dissection 2. adjuvant chemotherapy 3. Adjuvant radiation 4. Adjuvant antiestrogen therapy with tamoxifen (which was originally started prior to surgery)  I would also like to obtain staging scans.

## 2017-02-04 NOTE — Progress Notes (Signed)
START ON PATHWAY REGIMEN - Breast   Doxorubicin + Cyclophosphamide [facility]):   A cycle is every 21 days:     Doxorubicin      Cyclophosphamide   **Always confirm dose/schedule in your pharmacy ordering system**    Paclitaxel 80 mg/m2 Weekly:   Administer weekly:     Paclitaxel   **Always confirm dose/schedule in your pharmacy ordering system**    Patient Characteristics: Postoperative without Neoadjuvant Therapy (Pathologic Staging), Invasive Disease, Adjuvant Therapy, HER2 Negative/Unknown/Equivocal, ER Positive, Node Positive, Node Positive (4+) Therapeutic Status: Postoperative without Neoadjuvant Therapy (Pathologic Staging) AJCC Grade: G1 AJCC N Category: pN2 AJCC M Category: cM0 ER Status: Positive (+) AJCC 8 Stage Grouping: IB HER2 Status: Negative (-) Oncotype Dx Recurrence Score: Not Appropriate AJCC T Category: pT2 PR Status: Positive (+) Intent of Therapy: Curative Intent, Discussed with Patient

## 2017-02-04 NOTE — Telephone Encounter (Signed)
No los when checked out - will follow up with [facility] and schedule needed appts.

## 2017-02-04 NOTE — Addendum Note (Signed)
Addended by: Thelma Barge MAY J on: 02/04/2017 11:42 AM   Modules accepted: Orders

## 2017-02-04 NOTE — Progress Notes (Signed)
Patient Care Team: Jettie Booze, NP as PCP - General (Family Medicine) Nicholas Lose, MD as Consulting Physician (Hematology and Oncology) Jovita Kussmaul, MD as Consulting Physician (General Surgery) Gery Pray, MD as Consulting Physician (Radiation Oncology)  DIAGNOSIS:  Encounter Diagnosis  Name Primary?  . 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, estrogen receptor positive ([place])     SUMMARY OF ONCOLOGIC HISTORY:   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, estrogen receptor positive ([place])   01/02/2017 Initial Diagnosis    Palpable right breast mass retroareolar 6:30 position: 3.6 cm size axilla negative, 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 HER-2 negative ratio 1.31 Ki-67 3% in addition calcifications UIQ 1.4 cm stereotactic biopsy flat epithelial atypia; clips are 4.3 cm apart, T2 N0 stage IB clinical stage AJCC 8      01/28/2017 Surgery    Bilateral mastectomies: Right: Grade 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%, PR 95%, HER-2 negative ratio 1.31, Ki-67 3%, 4/4 lymph nodes positive; left mastectomy: PASH and FC changes, no malignancy; T2 N2,  stage IIA AJCC 8       CHIEF COMPLIANT:  Follow-up to discuss the pathology report from the recent bilateral mastectomy surgery  INTERVAL HISTORY: Angelica Floyd is a 51 year old with above-mentioned history of right breast invasive lobular cancer underwent bilateral mastectomies and sentinel lymph node biopsy on the right side. She is here today to discuss the pathology report. She is accompanied by her husband. She is very tearful and anxious today. She is currently taking tamoxifen and is tolerating it fairly well.  REVIEW OF SYSTEMS:   Constitutional: Denies fevers, chills or abnormal weight loss Eyes: Denies blurriness of vision Ears, nose, mouth, throat, and face: Denies mucositis or sore throat Respiratory: Denies cough, dyspnea or wheezes Cardiovascular: Denies  palpitation, chest discomfort Gastrointestinal:  Denies nausea, heartburn or change in bowel habits Skin: Denies abnormal skin rashes Lymphatics: Denies new lymphadenopathy or easy bruising Neurological:Denies numbness, tingling or new weaknesses Behavioral/Psych: Anxious and tearful Extremities: No lower extremity edema Breast: Recent bilateral mastectomies All other systems were reviewed with the patient and are negative.  I have reviewed the past medical history, past surgical history, social history and family history with the patient and they are unchanged from previous note.  ALLERGIES:  has No Known Allergies.  MEDICATIONS:  Current Outpatient Prescriptions  Medication Sig Dispense Refill  . Aspirin-Salicylamide-Caffeine (BC HEADACHE POWDER PO) Take 1 packet by mouth daily as needed (headaches).    Marland Kitchen HYDROcodone-acetaminophen (NORCO/VICODIN) 5-325 MG tablet Take 1-2 tablets by mouth every 4 (four) hours as needed for moderate pain or severe pain. 20 tablet 0  . Ketotifen Fumarate (ZADITOR OP) Apply 1 drop to eye daily as needed (allergies).    . methocarbamol (ROBAXIN) 750 MG tablet Take 1 tablet (750 mg total) by mouth 4 (four) times daily as needed (use for muscle cramps/pain). 30 tablet 1  . Multiple Vitamins-Minerals (CENTRUM ADULTS) TABS Take 1 tablet by mouth daily.     . tamoxifen (NOLVADEX) 20 MG tablet Take 1 tablet (20 mg total) by mouth daily. 30 tablet 3   No current facility-administered medications for this visit.     PHYSICAL EXAMINATION: ECOG PERFORMANCE STATUS: 1 - Symptomatic but completely ambulatory  Vitals:   02/04/17 0906  BP: (!) 144/73  Pulse: 95  Resp: 18  Temp: 98 F (36.7 C)  SpO2: 100%   Filed Weights   02/04/17 0906  Weight: 121 lb 6.4 oz (55.1 kg)    GENERAL:alert, no distress and comfortable SKIN: skin color, texture, turgor are normal, no rashes or significant lesions EYES: normal, Conjunctiva are pink and non-injected, sclera  clear OROPHARYNX:no exudate, no erythema and lips, buccal mucosa, and tongue normal  NECK: supple, thyroid normal size, non-tender, without nodularity LYMPH:  no palpable lymphadenopathy in the cervical, axillary or inguinal LUNGS: clear to auscultation and percussion with normal breathing effort HEART: regular rate & rhythm and no murmurs and no lower extremity edema ABDOMEN:abdomen soft, non-tender and normal bowel sounds MUSCULOSKELETAL:no cyanosis of digits and no clubbing  NEURO: alert & oriented x 3 with fluent speech, no focal motor/sensory deficits EXTREMITIES: No lower extremity edema  LABORATORY DATA:  I have reviewed the data as listed   Chemistry      Component Value Date/Time   NA 137 01/23/2017 0942   NA 137 01/09/2017 0819   K 3.5 01/23/2017 0942   K 3.9 01/09/2017 0819   CL 105 01/23/2017 0942   CO2 25 01/23/2017 0942   CO2 25 01/09/2017 0819   BUN 9 01/23/2017 0942   BUN 10.0 01/09/2017 0819   CREATININE 0.92 01/23/2017 0942   CREATININE 0.9 01/09/2017 0819      Component Value Date/Time   CALCIUM 9.2 01/23/2017 0942   CALCIUM 9.4 01/09/2017 0819   ALKPHOS 57 01/09/2017 0819   AST 17 01/09/2017 0819   ALT 12 01/09/2017 0819   BILITOT 0.88 01/09/2017 0819       Lab Results  Component Value Date   WBC 4.5 01/23/2017   HGB 12.7 01/23/2017   HCT 39.9 01/23/2017   MCV 91.5 01/23/2017   PLT 241 01/23/2017   NEUTROABS 2.9 01/09/2017    ASSESSMENT & PLAN:  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, estrogen receptor positive ([place]) 01/28/2017: Bilateral mastectomies: Right: Grade 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%, PR 95%, HER-2 negative ratio 1.31, Ki-67 3%, 4/4 lymph nodes positive; left mastectomy: PASH and FC changes, no malignancy; T2 N2,  stage IIA AJCC 8  Pathology counseling: I discussed the final pathology report of the patient provided  a copy of this report. I discussed the margins as well as lymph node surgeries. We also  discussed the final staging along with previously performed ER/PR and HER-2/neu testing.  Recommendation: 1. Axillary lymph node dissection 2. adjuvant chemotherapy 3. Adjuvant radiation 4. Adjuvant antiestrogen therapy with tamoxifen (which was originally started prior to surgery)  Chemotherapy Counseling: I discussed the risks and benefits of chemotherapy including the risks of nausea/ vomiting, risk of infection from low WBC count, fatigue due to chemo or anemia, bruising or bleeding due to low platelets, mouth sores, loss/ change in taste and decreased appetite. Liver and kidney function will be monitored through out chemotherapy as abnormalities in liver and kidney function may be a side effect of treatment. Cardiac dysfunction due to Adriamycin was discussed in detail. Risk of permanent bone marrow dysfunction and leukemia due to chemo were also discussed.  I would also like to obtain staging scans. Plan to start treatment the end of October.  We will obtain a port , chemotherapy class, echocardiogram and scans I will call her with the results of the scans as soon as they become available. I spent 25 minutes talking to the patient of which more than half was spent in counseling and coordination of care.  No orders of the defined types were placed in this encounter.  The patient has a good understanding of the overall plan. she agrees with it. she will call with any problems that may develop before the next visit here.   Rulon Eisenmenger, MD 02/04/17

## 2017-02-05 ENCOUNTER — Ambulatory Visit: Payer: Self-pay | Admitting: General Surgery

## 2017-02-12 ENCOUNTER — Encounter (HOSPITAL_COMMUNITY): Payer: Self-pay | Admitting: *Deleted

## 2017-02-12 ENCOUNTER — Telehealth: Payer: Self-pay | Admitting: Hematology and Oncology

## 2017-02-12 ENCOUNTER — Other Ambulatory Visit: Payer: PRIVATE HEALTH INSURANCE

## 2017-02-12 ENCOUNTER — Encounter: Payer: Self-pay | Admitting: *Deleted

## 2017-02-12 NOTE — Telephone Encounter (Signed)
Spoke with patient regarding adding appts per 10/2 sch msg.

## 2017-02-13 ENCOUNTER — Encounter (HOSPITAL_COMMUNITY): Payer: Self-pay | Admitting: Anesthesiology

## 2017-02-13 ENCOUNTER — Encounter (HOSPITAL_COMMUNITY): Payer: Self-pay

## 2017-02-13 ENCOUNTER — Ambulatory Visit (HOSPITAL_BASED_OUTPATIENT_CLINIC_OR_DEPARTMENT_OTHER)
Admission: RE | Admit: 2017-02-13 | Discharge: 2017-02-13 | Disposition: A | Discharge status: Home / Self Care | Payer: PRIVATE HEALTH INSURANCE | Source: Ambulatory Visit | Attending: Hematology and Oncology | Admitting: Hematology and Oncology

## 2017-02-13 ENCOUNTER — Encounter (HOSPITAL_COMMUNITY)
Admission: RE | Admit: 2017-02-13 | Discharge: 2017-02-13 | Disposition: A | Discharge status: Home / Self Care | Payer: PRIVATE HEALTH INSURANCE | Source: Ambulatory Visit | Attending: Hematology and Oncology | Admitting: Hematology and Oncology

## 2017-02-13 ENCOUNTER — Ambulatory Visit (HOSPITAL_COMMUNITY)
Admission: RE | Admit: 2017-02-13 | Discharge: 2017-02-13 | Disposition: A | Discharge status: Home / Self Care | Payer: PRIVATE HEALTH INSURANCE | Source: Ambulatory Visit | Attending: Hematology and Oncology | Admitting: Hematology and Oncology

## 2017-02-13 DIAGNOSIS — C50511 Malignant neoplasm of lower-outer quadrant of right female breast: Secondary | ICD-10-CM

## 2017-02-13 DIAGNOSIS — M899 Disorder of bone, unspecified: Secondary | ICD-10-CM | POA: Diagnosis not present

## 2017-02-13 DIAGNOSIS — Z170 Estrogen receptor positive status [ER+]: Secondary | ICD-10-CM | POA: Insufficient documentation

## 2017-02-13 DIAGNOSIS — K769 Liver disease, unspecified: Secondary | ICD-10-CM | POA: Insufficient documentation

## 2017-02-13 DIAGNOSIS — N83292 Other ovarian cyst, left side: Secondary | ICD-10-CM | POA: Insufficient documentation

## 2017-02-13 DIAGNOSIS — R911 Solitary pulmonary nodule: Secondary | ICD-10-CM | POA: Diagnosis not present

## 2017-02-13 DIAGNOSIS — J8410 Pulmonary fibrosis, unspecified: Secondary | ICD-10-CM | POA: Diagnosis not present

## 2017-02-13 DIAGNOSIS — Z9013 Acquired absence of bilateral breasts and nipples: Secondary | ICD-10-CM | POA: Diagnosis not present

## 2017-02-13 DIAGNOSIS — N83291 Other ovarian cyst, right side: Secondary | ICD-10-CM | POA: Insufficient documentation

## 2017-02-13 MED ORDER — TECHNETIUM TC 99M MEDRONATE IV KIT
21.5000 | PACK | Freq: Once | INTRAVENOUS | Status: AC | PRN
  Administered 2017-02-13: 21.5 via INTRAVENOUS

## 2017-02-13 MED ORDER — IOPAMIDOL (ISOVUE-300) INJECTION 61%
100.0000 mL | Freq: Once | INTRAVENOUS | Status: AC | PRN
  Administered 2017-02-13: 100 mL via INTRAVENOUS

## 2017-02-13 MED ORDER — IOPAMIDOL (ISOVUE-300) INJECTION 61%
INTRAVENOUS | Status: AC
  Filled 2017-02-13: qty 100

## 2017-02-13 NOTE — Anesthesia Preprocedure Evaluation (Addendum)
Anesthesia Evaluation  Patient identified by MRN, date of birth, ID band Patient awake    Reviewed: Allergy & Precautions, NPO status , Patient's Chart, lab work & pertinent test results  Airway Mallampati: I       Dental no notable dental hx. (+) Teeth Intact   Pulmonary neg pulmonary ROS,    Pulmonary exam normal breath sounds clear to auscultation       Cardiovascular Normal cardiovascular exam Rhythm:Regular Rate:Normal     Neuro/Psych PSYCHIATRIC DISORDERS Anxiety negative neurological ROS     GI/Hepatic negative GI ROS, Neg liver ROS,   Endo/Other  negative endocrine ROS  Renal/GU negative Renal ROS  negative genitourinary   Musculoskeletal negative musculoskeletal ROS (+)   Abdominal Normal abdominal exam  (+)   Peds  Hematology negative hematology ROS (+)   Anesthesia Other Findings   Reproductive/Obstetrics                            Anesthesia Physical Anesthesia Plan  ASA: II  Anesthesia Plan: General   Post-op Pain Management:  Regional for Post-op pain   Induction: Intravenous  PONV Risk Score and Plan: 3 and Ondansetron, Dexamethasone, Midazolam and Scopolamine patch - Pre-op  Airway Management Planned: LMA  Additional Equipment:   Intra-op Plan:   Post-operative Plan:   Informed Consent: I have reviewed the patients History and Physical, chart, labs and discussed the procedure including the risks, benefits and alternatives for the proposed anesthesia with the patient or authorized representative who has indicated his/her understanding and acceptance.   Dental advisory given  Plan Discussed with: CRNA and Surgeon  Anesthesia Plan Comments:        Anesthesia Quick Evaluation

## 2017-02-13 NOTE — Progress Notes (Signed)
  Echocardiogram 2D Echocardiogram has been performed.  Angelica Floyd 02/13/2017, 9:46 AM

## 2017-02-14 ENCOUNTER — Ambulatory Visit (HOSPITAL_COMMUNITY): Payer: PRIVATE HEALTH INSURANCE

## 2017-02-14 ENCOUNTER — Ambulatory Visit (HOSPITAL_COMMUNITY)
Admission: RE | Admit: 2017-02-14 | Discharge: 2017-02-15 | Disposition: A | Discharge status: Home / Self Care | Payer: PRIVATE HEALTH INSURANCE | Source: Ambulatory Visit | Attending: General Surgery | Admitting: General Surgery

## 2017-02-14 ENCOUNTER — Encounter (HOSPITAL_COMMUNITY): Payer: Self-pay | Admitting: Surgery

## 2017-02-14 ENCOUNTER — Encounter (HOSPITAL_COMMUNITY)
Admission: RE | Disposition: A | Discharge status: Home / Self Care | Payer: Self-pay | Source: Ambulatory Visit | Attending: General Surgery

## 2017-02-14 ENCOUNTER — Ambulatory Visit (HOSPITAL_COMMUNITY): Payer: PRIVATE HEALTH INSURANCE | Admitting: Anesthesiology

## 2017-02-14 DIAGNOSIS — Z4001 Encounter for prophylactic removal of breast: Secondary | ICD-10-CM | POA: Insufficient documentation

## 2017-02-14 DIAGNOSIS — Z170 Estrogen receptor positive status [ER+]: Secondary | ICD-10-CM | POA: Diagnosis not present

## 2017-02-14 DIAGNOSIS — C50111 Malignant neoplasm of central portion of right female breast: Secondary | ICD-10-CM | POA: Diagnosis present

## 2017-02-14 DIAGNOSIS — Z79899 Other long term (current) drug therapy: Secondary | ICD-10-CM | POA: Diagnosis not present

## 2017-02-14 DIAGNOSIS — F419 Anxiety disorder, unspecified: Secondary | ICD-10-CM | POA: Diagnosis not present

## 2017-02-14 DIAGNOSIS — C50511 Malignant neoplasm of lower-outer quadrant of right female breast: Secondary | ICD-10-CM | POA: Insufficient documentation

## 2017-02-14 DIAGNOSIS — Z419 Encounter for procedure for purposes other than remedying health state, unspecified: Secondary | ICD-10-CM

## 2017-02-14 DIAGNOSIS — C773 Secondary and unspecified malignant neoplasm of axilla and upper limb lymph nodes: Secondary | ICD-10-CM | POA: Insufficient documentation

## 2017-02-14 DIAGNOSIS — Z95828 Presence of other vascular implants and grafts: Secondary | ICD-10-CM

## 2017-02-14 HISTORY — DX: Anxiety disorder, unspecified: F41.9

## 2017-02-14 HISTORY — PX: AXILLARY LYMPH NODE DISSECTION: SHX5229

## 2017-02-14 HISTORY — PX: RE-EXCISION OF BREAST CANCER,SUPERIOR MARGINS: SHX6047

## 2017-02-14 HISTORY — PX: PORTACATH PLACEMENT: SHX2246

## 2017-02-14 LAB — CBC
HCT: 39.8 % (ref 36.0–46.0)
HEMOGLOBIN: 13.1 g/dL (ref 12.0–15.0)
MCH: 29.8 pg (ref 26.0–34.0)
MCHC: 32.9 g/dL (ref 30.0–36.0)
MCV: 90.7 fL (ref 78.0–100.0)
Platelets: 268 10*3/uL (ref 150–400)
RBC: 4.39 MIL/uL (ref 3.87–5.11)
RDW: 12.7 % (ref 11.5–15.5)
WBC: 6.2 10*3/uL (ref 4.0–10.5)

## 2017-02-14 LAB — BASIC METABOLIC PANEL
ANION GAP: 9 (ref 5–15)
BUN: 8 mg/dL (ref 6–20)
CO2: 23 mmol/L (ref 22–32)
Calcium: 9.1 mg/dL (ref 8.9–10.3)
Chloride: 105 mmol/L (ref 101–111)
Creatinine, Ser: 0.91 mg/dL (ref 0.44–1.00)
GFR calc Af Amer: >60 mL/min (ref >60)
GFR calc non Af Amer: >60 mL/min (ref >60)
GLUCOSE: 103 mg/dL — AB (ref 65–99)
Potassium: 4.7 mmol/L (ref 3.5–5.1)
Sodium: 137 mmol/L (ref 135–145)

## 2017-02-14 SURGERY — LYMPHADENECTOMY, AXILLARY
Anesthesia: General | Site: Chest | Laterality: Right

## 2017-02-14 MED ORDER — HEPARIN SOD (PORK) LOCK FLUSH 100 UNIT/ML IV SOLN
INTRAVENOUS | Status: AC
  Filled 2017-02-14: qty 5

## 2017-02-14 MED ORDER — KCL IN DEXTROSE-NACL 20-5-0.9 MEQ/L-%-% IV SOLN
INTRAVENOUS | Status: DC
  Administered 2017-02-14: 19:00:00 via INTRAVENOUS

## 2017-02-14 MED ORDER — MIDAZOLAM HCL 2 MG/2ML IJ SOLN
INTRAMUSCULAR | Status: AC
  Filled 2017-02-14: qty 2

## 2017-02-14 MED ORDER — METHOCARBAMOL 500 MG PO TABS
500.0000 mg | ORAL_TABLET | Freq: Four times a day (QID) | ORAL | Status: DC | PRN
  Administered 2017-02-14 (×2): 500 mg via ORAL
  Filled 2017-02-14: qty 1

## 2017-02-14 MED ORDER — TAMOXIFEN CITRATE 20 MG PO TABS: 20.0000 mg | ORAL_TABLET | Freq: Every day | ORAL | Status: DC

## 2017-02-14 MED ORDER — MEPERIDINE HCL 25 MG/ML IJ SOLN: 6.2500 mg | INTRAMUSCULAR | Status: DC | PRN

## 2017-02-14 MED ORDER — FENTANYL CITRATE (PF) 100 MCG/2ML IJ SOLN
25.0000 ug | INTRAMUSCULAR | Status: DC | PRN
  Administered 2017-02-14: 50 ug via INTRAVENOUS

## 2017-02-14 MED ORDER — MIDAZOLAM HCL 5 MG/5ML IJ SOLN
INTRAMUSCULAR | Status: DC | PRN
  Administered 2017-02-14: 2 mg via INTRAVENOUS

## 2017-02-14 MED ORDER — BUPIVACAINE-EPINEPHRINE (PF) 0.25% -1:200000 IJ SOLN
INTRAMUSCULAR | Status: AC
  Filled 2017-02-14: qty 30

## 2017-02-14 MED ORDER — DEXAMETHASONE SODIUM PHOSPHATE 10 MG/ML IJ SOLN
INTRAMUSCULAR | Status: AC
  Filled 2017-02-14: qty 1

## 2017-02-14 MED ORDER — ONDANSETRON HCL 4 MG/2ML IJ SOLN: 4.0000 mg | Freq: Four times a day (QID) | INTRAMUSCULAR | Status: DC | PRN

## 2017-02-14 MED ORDER — CHLORHEXIDINE GLUCONATE CLOTH 2 % EX PADS: 6.0000 | MEDICATED_PAD | Freq: Once | CUTANEOUS | Status: DC

## 2017-02-14 MED ORDER — GLYCOPYRROLATE 0.2 MG/ML IJ SOLN
INTRAMUSCULAR | Status: DC | PRN
  Administered 2017-02-14: 0.2 mg via INTRAVENOUS

## 2017-02-14 MED ORDER — ONDANSETRON 4 MG PO TBDP: 4.0000 mg | ORAL_TABLET | Freq: Four times a day (QID) | ORAL | Status: DC | PRN

## 2017-02-14 MED ORDER — HYDROCODONE-ACETAMINOPHEN 5-325 MG PO TABS
1.0000 | ORAL_TABLET | ORAL | Status: DC | PRN
  Administered 2017-02-14 – 2017-02-15 (×3): 1 via ORAL
  Filled 2017-02-14 (×2): qty 1

## 2017-02-14 MED ORDER — GABAPENTIN 300 MG PO CAPS
ORAL_CAPSULE | ORAL | Status: AC
  Administered 2017-02-14: 300 mg via ORAL
  Filled 2017-02-14: qty 1

## 2017-02-14 MED ORDER — KETOROLAC TROMETHAMINE 30 MG/ML IJ SOLN
INTRAMUSCULAR | Status: AC
  Administered 2017-02-14: 30 mg via INTRAVENOUS
  Filled 2017-02-14: qty 1

## 2017-02-14 MED ORDER — 0.9 % SODIUM CHLORIDE (POUR BTL) OPTIME
TOPICAL | Status: DC | PRN
  Administered 2017-02-14: 1000 mL

## 2017-02-14 MED ORDER — SODIUM CHLORIDE 0.9 % IV SOLN
INTRAVENOUS | Status: DC | PRN
  Administered 2017-02-14: 500 mL

## 2017-02-14 MED ORDER — ONDANSETRON HCL 4 MG/2ML IJ SOLN
INTRAMUSCULAR | Status: DC | PRN
  Administered 2017-02-14: 4 mg via INTRAVENOUS

## 2017-02-14 MED ORDER — FENTANYL CITRATE (PF) 100 MCG/2ML IJ SOLN
INTRAMUSCULAR | Status: AC
  Administered 2017-02-14: 50 ug via INTRAVENOUS
  Filled 2017-02-14: qty 2

## 2017-02-14 MED ORDER — PROPOFOL 10 MG/ML IV BOLUS
INTRAVENOUS | Status: AC
  Filled 2017-02-14: qty 20

## 2017-02-14 MED ORDER — MORPHINE SULFATE (PF) 4 MG/ML IV SOLN: 1.0000 mg | INTRAVENOUS | Status: DC | PRN

## 2017-02-14 MED ORDER — FENTANYL CITRATE (PF) 250 MCG/5ML IJ SOLN
INTRAMUSCULAR | Status: AC
  Filled 2017-02-14: qty 5

## 2017-02-14 MED ORDER — CELECOXIB 200 MG PO CAPS
ORAL_CAPSULE | ORAL | Status: AC
  Administered 2017-02-14: 200 mg via ORAL
  Filled 2017-02-14: qty 1

## 2017-02-14 MED ORDER — AZITHROMYCIN 250 MG PO TABS
250.0000 mg | ORAL_TABLET | Freq: Every day | ORAL | Status: DC
  Administered 2017-02-14: 250 mg via ORAL
  Filled 2017-02-14: qty 1

## 2017-02-14 MED ORDER — KETOROLAC TROMETHAMINE 30 MG/ML IJ SOLN
30.0000 mg | Freq: Once | INTRAMUSCULAR | Status: DC | PRN
  Administered 2017-02-14: 30 mg via INTRAVENOUS

## 2017-02-14 MED ORDER — HYDROCODONE-ACETAMINOPHEN 5-325 MG PO TABS
ORAL_TABLET | ORAL | Status: AC
  Filled 2017-02-14: qty 1

## 2017-02-14 MED ORDER — PANTOPRAZOLE SODIUM 40 MG IV SOLR
40.0000 mg | Freq: Every day | INTRAVENOUS | Status: DC
  Administered 2017-02-14: 40 mg via INTRAVENOUS
  Filled 2017-02-14: qty 40

## 2017-02-14 MED ORDER — ONDANSETRON HCL 4 MG/2ML IJ SOLN
INTRAMUSCULAR | Status: AC
  Filled 2017-02-14: qty 2

## 2017-02-14 MED ORDER — PROPOFOL 10 MG/ML IV BOLUS
INTRAVENOUS | Status: DC | PRN
  Administered 2017-02-14: 150 mg via INTRAVENOUS

## 2017-02-14 MED ORDER — EPHEDRINE 5 MG/ML INJ
INTRAVENOUS | Status: AC
  Filled 2017-02-14: qty 10

## 2017-02-14 MED ORDER — HYDROCODONE-ACETAMINOPHEN 5-325 MG PO TABS: 1.0000 | ORAL_TABLET | ORAL | 0 refills | Status: DC | PRN

## 2017-02-14 MED ORDER — FENTANYL CITRATE (PF) 100 MCG/2ML IJ SOLN
INTRAMUSCULAR | Status: DC | PRN
  Administered 2017-02-14: 50 ug via INTRAVENOUS
  Administered 2017-02-14 (×2): 25 ug via INTRAVENOUS
  Administered 2017-02-14: 150 ug via INTRAVENOUS

## 2017-02-14 MED ORDER — ACETAMINOPHEN 500 MG PO TABS
ORAL_TABLET | ORAL | Status: AC
  Administered 2017-02-14: 1000 mg via ORAL
  Filled 2017-02-14: qty 2

## 2017-02-14 MED ORDER — LACTATED RINGERS IV SOLN
INTRAVENOUS | Status: DC
  Administered 2017-02-14 (×3): via INTRAVENOUS

## 2017-02-14 MED ORDER — CEFAZOLIN SODIUM-DEXTROSE 2-4 GM/100ML-% IV SOLN
INTRAVENOUS | Status: AC
  Filled 2017-02-14: qty 100

## 2017-02-14 MED ORDER — DEXAMETHASONE SODIUM PHOSPHATE 10 MG/ML IJ SOLN
INTRAMUSCULAR | Status: DC | PRN
  Administered 2017-02-14: 8 mg via INTRAVENOUS

## 2017-02-14 MED ORDER — FENTANYL CITRATE (PF) 100 MCG/2ML IJ SOLN
INTRAMUSCULAR | Status: AC
  Filled 2017-02-14: qty 2

## 2017-02-14 MED ORDER — HEPARIN SODIUM (PORCINE) 5000 UNIT/ML IJ SOLN
5000.0000 [IU] | Freq: Three times a day (TID) | INTRAMUSCULAR | Status: DC
  Administered 2017-02-15: 5000 [IU] via SUBCUTANEOUS
  Filled 2017-02-14: qty 1

## 2017-02-14 MED ORDER — LIDOCAINE HCL (CARDIAC) 20 MG/ML IV SOLN
INTRAVENOUS | Status: DC | PRN
  Administered 2017-02-14: 100 mg via INTRAVENOUS

## 2017-02-14 MED ORDER — MORPHINE SULFATE (PF) 2 MG/ML IV SOLN: 1.0000 mg | INTRAVENOUS | Status: DC | PRN

## 2017-02-14 MED ORDER — ACETAMINOPHEN 500 MG PO TABS
1000.0000 mg | ORAL_TABLET | ORAL | Status: AC
  Administered 2017-02-14: 1000 mg via ORAL

## 2017-02-14 MED ORDER — PHENYLEPHRINE HCL 10 MG/ML IJ SOLN
INTRAMUSCULAR | Status: DC | PRN
  Administered 2017-02-14: 40 ug/min via INTRAVENOUS

## 2017-02-14 MED ORDER — HEPARIN SOD (PORK) LOCK FLUSH 100 UNIT/ML IV SOLN
INTRAVENOUS | Status: DC | PRN
  Administered 2017-02-14: 400 [IU]

## 2017-02-14 MED ORDER — METHOCARBAMOL 500 MG PO TABS
ORAL_TABLET | ORAL | Status: AC
  Administered 2017-02-14: 500 mg via ORAL
  Filled 2017-02-14: qty 1

## 2017-02-14 MED ORDER — CEFAZOLIN SODIUM-DEXTROSE 2-4 GM/100ML-% IV SOLN
2.0000 g | INTRAVENOUS | Status: AC
  Administered 2017-02-14: 2 g via INTRAVENOUS

## 2017-02-14 MED ORDER — ONDANSETRON HCL 4 MG/2ML IJ SOLN: 4.0000 mg | Freq: Once | INTRAMUSCULAR | Status: DC | PRN

## 2017-02-14 MED ORDER — GABAPENTIN 300 MG PO CAPS
300.0000 mg | ORAL_CAPSULE | ORAL | Status: AC
  Administered 2017-02-14: 300 mg via ORAL

## 2017-02-14 MED ORDER — CELECOXIB 200 MG PO CAPS
200.0000 mg | ORAL_CAPSULE | ORAL | Status: AC
  Administered 2017-02-14: 200 mg via ORAL

## 2017-02-14 MED ORDER — LIDOCAINE 2% (20 MG/ML) 5 ML SYRINGE
INTRAMUSCULAR | Status: AC
  Filled 2017-02-14: qty 5

## 2017-02-14 MED ORDER — BUPIVACAINE-EPINEPHRINE (PF) 0.25% -1:200000 IJ SOLN
INTRAMUSCULAR | Status: DC | PRN
  Administered 2017-02-14: 6 mL

## 2017-02-14 SURGICAL SUPPLY — 58 items
APPLIER CLIP 9.375 MED OPEN (MISCELLANEOUS) ×8
BAG DECANTER FOR FLEXI CONT (MISCELLANEOUS) ×4 IMPLANT
BINDER BREAST LRG (GAUZE/BANDAGES/DRESSINGS) ×4 IMPLANT
BIOPATCH RED 1 DISK 7.0 (GAUZE/BANDAGES/DRESSINGS) ×4 IMPLANT
BLADE SURG 15 STRL LF DISP TIS (BLADE) ×3 IMPLANT
BLADE SURG 15 STRL SS (BLADE) ×1
CHLORAPREP W/TINT 26ML (MISCELLANEOUS) ×8 IMPLANT
CLIP APPLIE 9.375 MED OPEN (MISCELLANEOUS) ×6 IMPLANT
COVER SURGICAL LIGHT HANDLE (MISCELLANEOUS) ×4 IMPLANT
CRADLE DONUT ADULT HEAD (MISCELLANEOUS) ×4 IMPLANT
DERMABOND ADVANCED (GAUZE/BANDAGES/DRESSINGS) ×2
DERMABOND ADVANCED .7 DNX12 (GAUZE/BANDAGES/DRESSINGS) ×6 IMPLANT
DRAIN CHANNEL 19F RND (DRAIN) ×4 IMPLANT
DRAPE C-ARM 42X72 X-RAY (DRAPES) ×4 IMPLANT
DRAPE CHEST BREAST 15X10 FENES (DRAPES) ×8 IMPLANT
DRAPE UTILITY XL STRL (DRAPES) ×4 IMPLANT
DRSG TEGADERM 2-3/8X2-3/4 SM (GAUZE/BANDAGES/DRESSINGS) ×4 IMPLANT
ELECT CAUTERY BLADE 6.4 (BLADE) ×4 IMPLANT
ELECT COATED BLADE 2.86 ST (ELECTRODE) ×4 IMPLANT
ELECT REM PT RETURN 9FT ADLT (ELECTROSURGICAL) ×4
ELECTRODE REM PT RTRN 9FT ADLT (ELECTROSURGICAL) ×3 IMPLANT
EVACUATOR SILICONE 100CC (DRAIN) ×4 IMPLANT
GAUZE SPONGE 4X4 12PLY STRL (GAUZE/BANDAGES/DRESSINGS) ×4 IMPLANT
GAUZE SPONGE 4X4 16PLY XRAY LF (GAUZE/BANDAGES/DRESSINGS) ×4 IMPLANT
GLOVE BIO SURGEON STRL SZ7.5 (GLOVE) ×12 IMPLANT
GLOVE BIOGEL PI IND STRL 6.5 (GLOVE) ×3 IMPLANT
GLOVE BIOGEL PI IND STRL 7.5 (GLOVE) ×3 IMPLANT
GLOVE BIOGEL PI INDICATOR 6.5 (GLOVE) ×1
GLOVE BIOGEL PI INDICATOR 7.5 (GLOVE) ×1
GLOVE SURG SS PI 6.0 STRL IVOR (GLOVE) ×8 IMPLANT
GOWN STRL REUS W/ TWL LRG LVL3 (GOWN DISPOSABLE) ×12 IMPLANT
GOWN STRL REUS W/TWL LRG LVL3 (GOWN DISPOSABLE) ×4
KIT BASIN OR (CUSTOM PROCEDURE TRAY) ×4 IMPLANT
KIT PORT POWER 8FR ISP CVUE (Miscellaneous) ×4 IMPLANT
KIT ROOM TURNOVER OR (KITS) ×4 IMPLANT
LIGHT WAVEGUIDE WIDE FLAT (MISCELLANEOUS) ×4 IMPLANT
NEEDLE HYPO 25GX1X1/2 BEV (NEEDLE) ×4 IMPLANT
NS IRRIG 1000ML POUR BTL (IV SOLUTION) ×4 IMPLANT
PACK GENERAL/GYN (CUSTOM PROCEDURE TRAY) ×4 IMPLANT
PAD ABD 8X10 STRL (GAUZE/BANDAGES/DRESSINGS) ×4 IMPLANT
PAD ARMBOARD 7.5X6 YLW CONV (MISCELLANEOUS) ×4 IMPLANT
SPECIMEN JAR MEDIUM (MISCELLANEOUS) ×4 IMPLANT
SUT ETHILON 3 0 FSL (SUTURE) ×4 IMPLANT
SUT MNCRL AB 4-0 PS2 18 (SUTURE) ×4 IMPLANT
SUT MON AB 4-0 PC3 18 (SUTURE) ×4 IMPLANT
SUT PROLENE 2 0 SH 30 (SUTURE) ×8 IMPLANT
SUT SILK 2 0 (SUTURE)
SUT SILK 2-0 18XBRD TIE 12 (SUTURE) IMPLANT
SUT VIC AB 2-0 SH 27 (SUTURE) ×2
SUT VIC AB 2-0 SH 27XBRD (SUTURE) ×6 IMPLANT
SUT VIC AB 3-0 SH 18 (SUTURE) ×4 IMPLANT
SUT VIC AB 3-0 SH 27 (SUTURE) ×1
SUT VIC AB 3-0 SH 27XBRD (SUTURE) ×3 IMPLANT
SYR 10ML LL (SYRINGE) ×4 IMPLANT
SYR 20ML ECCENTRIC (SYRINGE) ×8 IMPLANT
SYR 5ML LUER SLIP (SYRINGE) ×4 IMPLANT
SYR CONTROL 10ML LL (SYRINGE) ×4 IMPLANT
TOWEL OR 17X26 10 PK STRL BLUE (TOWEL DISPOSABLE) ×4 IMPLANT

## 2017-02-14 NOTE — H&P (Signed)
Angelica Floyd  Location: [facility] Surgery Patient #: [id number] DOB: 1966/05/05 Married / Language: English / Race: White Female   History of Present Illness  The patient is a 51 year old female who presents for a follow-up for Breast cancer. The patient is a 51 year old white female who is about one week status post right mastectomy and sentinel node mapping for a T2N2a right breast cancer that was ER and PR positive and HER-2 negative with a Ki-67 of 3%. All 4 sentinel nodes were positive. She also had a left prophylactic mastectomy. She also had a positive anterior inferior margin which was on skin. At this point she has met with oncology and will need a port for chemotherapy. She will also need a completion node dissection and I may be able to clear that inferior margin if I can remove some of that skin and move her skin flap down.   Allergies  No Known Drug Allergies  Allergies Reconciled   Medication History  Tamoxifen Citrate (20MG Tablet, Oral) Active. Medications Reconciled    Review of Systems  General Present- Weight Loss. Not Present- Appetite Loss, Chills, Fatigue, Fever, Night Sweats and Weight Gain. Skin Not Present- Change in Wart/Mole, Dryness, Hives, Jaundice, New Lesions, Non-Healing Wounds, Rash and Ulcer. HEENT Present- Wears glasses/contact lenses. Not Present- Earache, Hearing Loss, Hoarseness, Nose Bleed, Oral Ulcers, Ringing in the Ears, Seasonal Allergies, Sinus Pain, Sore Throat, Visual Disturbances and Yellow Eyes. Respiratory Present- Snoring. Not Present- Bloody sputum, Chronic Cough, Difficulty Breathing and Wheezing. Breast Present- Breast Mass and Breast Pain. Not Present- Nipple Discharge and Skin Changes. Cardiovascular Not Present- Chest Pain, Difficulty Breathing Lying Down, Leg Cramps, Palpitations, Rapid Heart Rate, Shortness of Breath and Swelling of Extremities. Gastrointestinal Present- Change in Bowel Habits and Hemorrhoids. Not  Present- Abdominal Pain, Bloating, Bloody Stool, Chronic diarrhea, Constipation, Difficulty Swallowing, Excessive gas, Gets full quickly at meals, Indigestion, Nausea, Rectal Pain and Vomiting. Female Genitourinary Not Present- Frequency, Nocturia, Painful Urination, Pelvic Pain and Urgency. Musculoskeletal Not Present- Back Pain, Joint Pain, Joint Stiffness, Muscle Pain, Muscle Weakness and Swelling of Extremities. Neurological Not Present- Decreased Memory, Fainting, Headaches, Numbness, Seizures, Tingling, Tremor, Trouble walking and Weakness. Psychiatric Present- Change in Sleep Pattern. Not Present- Anxiety, Bipolar, Depression, Fearful and Frequent crying. Endocrine Not Present- Cold Intolerance, Excessive Hunger, Hair Changes, Heat Intolerance, Hot flashes and New Diabetes. Hematology Not Present- Blood Thinners, Easy Bruising, Excessive bleeding, Gland problems, HIV and Persistent Infections.  Vitals Weight: 119.2 lb Height: 63in Body Surface Area: 1.55 m Body Mass Index: 21.12 kg/m  Temp.: 98.71F  Pulse: 77 (Regular)  BP: 142/82 (Sitting, Left Arm, Standard)       Physical Exam  General Mental Status-Alert. General Appearance-Consistent with stated age. Hydration-Well hydrated. Voice-Normal.  Head and Neck Head-normocephalic, atraumatic with no lesions or palpable masses. Trachea-midline. Thyroid Gland Characteristics - normal size and consistency.  Eye Eyeball - Bilateral-Extraocular movements intact. Sclera/Conjunctiva - Bilateral-No scleral icterus.  Chest and Lung Exam Chest and lung exam reveals -quiet, even and easy respiratory effort with no use of accessory muscles and on auscultation, normal breath sounds, no adventitious sounds and normal vocal resonance. Inspection Chest Wall - Normal. Back - normal.  Breast Note: Both mastectomy incisions are healing nicely with no sign of infection or seroma. All the drains were removed  today since they are all down around 10 cc for the last 24 hours and because we will be going back to the operating room next week. The skin flaps  are healthy appearing.   Cardiovascular Cardiovascular examination reveals -normal heart sounds, regular rate and rhythm with no murmurs and normal pedal pulses bilaterally.  Abdomen Inspection Inspection of the abdomen reveals - No Hernias. Skin - Scar - no surgical scars. Palpation/Percussion Palpation and Percussion of the abdomen reveal - Soft, Non Tender, No Rebound tenderness, No Rigidity (guarding) and No hepatosplenomegaly. Auscultation Auscultation of the abdomen reveals - Bowel sounds normal.  Neurologic Neurologic evaluation reveals -alert and oriented x 3 with no impairment of recent or remote memory. Mental Status-Normal.  Musculoskeletal Normal Exam - Left-Upper Extremity Strength Normal and Lower Extremity Strength Normal. Normal Exam - Right-Upper Extremity Strength Normal and Lower Extremity Strength Normal.  Lymphatic Head & Neck  General Head & Neck Lymphatics: Bilateral - Description - Normal. Axillary  General Axillary Region: Bilateral - Description - Normal. Tenderness - Non Tender. Femoral & Inguinal  Generalized Femoral & Inguinal Lymphatics: Bilateral - Description - Normal. Tenderness - Non Tender.    Assessment & Plan  MALIGNANT NEOPLASM OF LOWER-OUTER QUADRANT OF RIGHT BREAST OF FEMALE, ESTROGEN RECEPTOR POSITIVE (C50.511) Impression: The patient is about 1 week status post right mastectomy and sentinel node mapping for a locally advanced right breast cancer as well as a left prophylactic mastectomy. At this point she needs to go back for a completion node dissection as well as port placement for chemotherapy. I will also try to clean up her inferior margin on the right skin flap. I have discussed with her in detail the risks and benefits of the operation as well as some of the technical aspects  and she understands and wishes to proceed. I will plan for this for next week. Current Plans Follow up with [place] in the office in 2 weeks.  Call us sooner as needed.

## 2017-02-14 NOTE — Transfer of Care (Signed)
Immediate Anesthesia Transfer of Care Note  Patient: Angelica Floyd  Procedure(s) Performed: RIGHT AXILLARY LYMPH NODE DISSECTION ERAS PATHWAY (Right Axilla) INSERTION PORT-A-CATH (Left Chest) RE-EXCISION INFERIOR FLAP (N/A Breast)  Patient Location: PACU  Anesthesia Type:General  Level of Consciousness: awake, alert  and oriented  Airway & Oxygen Therapy: Patient Spontanous Breathing and Patient connected to nasal cannula oxygen  Post-op Assessment: Report given to RN and Post -op Vital signs reviewed and stable  Post vital signs: Reviewed and stable  Last Vitals:  Vitals:   02/14/17 0813 02/14/17 1255  BP: 135/82   Pulse: 89   Resp: 18   Temp: 36.7 C 36.5 C  SpO2: 100%     Last Pain:  Vitals:   02/14/17 1255  TempSrc:   PainSc: 6       Patients Stated Pain Goal: 3 (97/74/14 2395)  Complications: No apparent anesthesia complications

## 2017-02-14 NOTE — Anesthesia Postprocedure Evaluation (Signed)
Anesthesia Post Note  Patient: Angelica Floyd  Procedure(s) Performed: RIGHT AXILLARY LYMPH NODE DISSECTION ERAS PATHWAY (Right Axilla) INSERTION PORT-A-CATH (Left Chest) RE-EXCISION INFERIOR FLAP (N/A Breast)     Patient location during evaluation: PACU Anesthesia Type: General Level of consciousness: awake Pain management: pain level controlled Vital Signs Assessment: post-procedure vital signs reviewed and stable Respiratory status: spontaneous breathing Cardiovascular status: stable Postop Assessment: no apparent nausea or vomiting Anesthetic complications: no    Last Vitals:  Vitals:   02/14/17 1325 02/14/17 1355  BP: 132/73   Pulse: 84   Resp: (!) 23   Temp:  36.4 C  SpO2: 100%     Last Pain:  Vitals:   02/14/17 1355  TempSrc:   PainSc: 4    Pain Goal: Patients Stated Pain Goal: 3 (02/14/17 1320)               Angelica Floyd,Angelica Floyd

## 2017-02-14 NOTE — Anesthesia Procedure Notes (Signed)
Procedure Name: LMA Insertion Date/Time: 02/14/2017 10:33 AM Performed by: Izora Gala Pre-anesthesia Checklist: Patient identified, Emergency Drugs available, Suction available and Patient being monitored Patient Re-evaluated:Patient Re-evaluated prior to induction Oxygen Delivery Method: Circle system utilized Preoxygenation: Pre-oxygenation with 100% oxygen Induction Type: IV induction Ventilation: Mask ventilation without difficulty LMA Size: 4.0 Placement Confirmation: positive ETCO2

## 2017-02-14 NOTE — Interval H&P Note (Signed)
History and Physical Interval Note:  02/14/2017 8:49 AM  Angelica Floyd  has presented today for surgery, with the diagnosis of right breast cancer  The various methods of treatment have been discussed with the patient and family. After consideration of risks, benefits and other options for treatment, the patient has consented to  Procedure(s): [place] (Right) INSERTION PORT-A-CATH (N/A) POSSIBLE RE-EXCISION INFERIOR FLAP (N/A) as a surgical intervention .  The patient's history has been reviewed, patient examined, no change in status, stable for surgery.  I have reviewed the patient's chart and labs.  Questions were answered to the patient's satisfaction.     TOTH III,PAUL S

## 2017-02-14 NOTE — Op Note (Signed)
02/14/2017  12:29 PM  PATIENT:  Angelica Floyd  51 y.o. female  PRE-OPERATIVE DIAGNOSIS:  right breast cancer  POST-OPERATIVE DIAGNOSIS:  right breast cancer  PROCEDURE:  Procedure(s): RIGHT DEEP AXILLARY LYMPH NODE DISSECTION ERAS PATHWAY (Right) INSERTION PORT-A-CATH (Left) RE-EXCISION INFERIOR FLAP (N/A)  SURGEON:  Surgeon(s) and Role:    * Jovita Kussmaul, MD - Primary  PHYSICIAN ASSISTANT:   ASSISTANTS: Sharyn Dross, RNFA   ANESTHESIA:   local and general  EBL:  Total I/O In: 1000 [I.V.:1000] Out: 66 [Blood:50]  BLOOD ADMINISTERED:none  DRAINS: (1) Jackson-Pratt drain(s) with closed bulb suction in the prepectoral space   LOCAL MEDICATIONS USED:  MARCAINE     SPECIMEN:  Source of Specimen:  right axillary contents and inferior flap  DISPOSITION OF SPECIMEN:  PATHOLOGY  COUNTS:  YES  TOURNIQUET:  * No tourniquets in log *  DICTATION: .Dragon Dictation   After informed consent was obtained the patient was brought to the operating room and placed in the supine position on the operating room table. After adequate induction of general anesthesia a roll was placed between the patient's shoulder blades to extend the shoulder slightly. The left chest and neck area were then prepped with ChloraPrep, allowed to dry, and draped in usual sterile manner. The patient was placed in Trendelenburg position. An appropriate timeout was performed. The area lateral to the bend of the clavicle was infiltrated with quarter percent Marcaine. A large bore needle from the port kit was used to slide beneath the bend of the clavicle on the left chest heading towards the sternal notch and in doing so I was able to access the left subclavian vein without difficulty. A wire was fed through the needle using the Seldinger technique without difficulty. The wire was confirmed in the central venous system using real-time fluoroscopy. Next a small incision was made at the wire entry site on the left  chest wall with a 15 blade knife. The incision was carried through the skin and subcutaneous tissue sharply with electrocautery. A subcutaneous pocket was created inferior to the incision by blunt finger dissection. Next the tubing was placed on the reservoir. The reservoir was placed in the pocket and the length of the tubing was estimated using real-time fluoroscopy and cut to the appropriate length. Next a sheath and dilator were fed over the wire using the Seldinger technique without difficulty. The dilator and wire were removed. The tubing was fed through the sheath as far as it would go and then held in place while the sheath was gently cracked and separated. Another fluoroscopy image showed the tip of the catheter to be in the distal superior vena cava. The tubing was then permanently attached to the reservoir. The reservoir was anchored in its pocket with 2 2-0 Prolene stitches. The port was then aspirated and it aspirated blood easily. The port was then flushed initially with a dilute heparin solution followed by a more concentrated heparin solution. The subcutaneous tissue was closed over the port with interrupted 3-0 Vicryl stitches. The skin was then closed with a running 4-0 Monocryl subcuticular stitch. Dermabond dressings were applied. Next the drapes were removed. The right chest wall and axillary area were then prepped with ChloraPrep, allowed to dry, and draped in usual sterile manner. An appropriate timeout was performed. The previous mastectomy on the right chest wall was opened sharply with a 15 blade knife. The superior and inferior skin flaps were adherent to the chest wall but we were able  to mobilize them with sharp dissection with the electrocautery. The dissection was then carried into the axilla. The serratus muscle medially and latissimus muscle laterally and axillary veins superiorly were all identified. The contents of the axilla within these boundaries was then dissected away by  combination of blunt right angle dissection and some sharp dissection with the electrocautery. Several vessels were controlled with clips. The thoracodorsal and long thoracic nerves were identified and spared. The contents of the right axilla were then removed and sent to pathology for further evaluation. There appeared to be enough mobilization of the superior and inferior skin flap to re-excise the central portion of the inferior flap. This was done sharply with a 15 blade knife and then the flap was dissected with the electrocautery. A skin and deep tissue margin of the inferior flap were sent separately. Hemostasis was achieved using the Bovie electrocautery. The wound was irrigated with copious amounts of saline. The previous drain incision was reopened sharply with a 15 blade knife. A tonsil clamp was placed through this opening and used ring and 19 [place] round Blake drain into the operative bed. The drain was anchored to the skin with a 3-0 nylon stitch. Next the superior and inferior skin flaps were grossly reapproximated with interrupted 3-0 Vicryl stitches. The skin was then closed with a running 4-0 Monocryl subcuticular stitch. Dermabond dressing and sterile dressings were applied. The patient tolerated the procedure well. At the end of the case all needle sponge counts were correct. The patient was then awakened and taken to recovery in stable condition.  PLAN OF CARE: Admit for overnight observation  PATIENT DISPOSITION:  PACU - hemodynamically stable.   Delay start of Pharmacological VTE agent (>24hrs) due to surgical blood loss or risk of bleeding: no

## 2017-02-15 ENCOUNTER — Encounter (HOSPITAL_COMMUNITY): Payer: Self-pay | Admitting: General Surgery

## 2017-02-15 ENCOUNTER — Ambulatory Visit (HOSPITAL_COMMUNITY): Payer: PRIVATE HEALTH INSURANCE

## 2017-02-15 ENCOUNTER — Other Ambulatory Visit (HOSPITAL_COMMUNITY): Payer: PRIVATE HEALTH INSURANCE

## 2017-02-15 ENCOUNTER — Encounter (HOSPITAL_COMMUNITY): Payer: PRIVATE HEALTH INSURANCE

## 2017-02-15 DIAGNOSIS — C50511 Malignant neoplasm of lower-outer quadrant of right female breast: Secondary | ICD-10-CM | POA: Diagnosis not present

## 2017-02-15 MED ORDER — PANTOPRAZOLE SODIUM 40 MG PO TBEC: 40.0000 mg | DELAYED_RELEASE_TABLET | Freq: Every day | ORAL | Status: DC

## 2017-02-15 NOTE — Progress Notes (Signed)
Glynn Octave to be D/C'd  per MD order. Discussed with the patient and all questions fully answered.  VSS, Skin clean, dry and intact without evidence of skin break down, no evidence of skin tears noted.  IV catheter discontinued intact. Site without signs and symptoms of complications. Dressing and pressure applied.  An After Visit Summary was printed and given to the patient. Patient received prescription.  D/c education completed with patient including follow up instructions, medication list, d/c activities limitations if indicated, with other d/c instructions as indicated by MD - patient able to verbalize understanding, all questions fully answered.   Patient instructed to return to ED, call 911, or call MD for any changes in condition.   Patient to be escorted via [place], and D/C home via private auto.

## 2017-02-15 NOTE — Progress Notes (Signed)
1 Day Post-Op   Subjective/Chief Complaint: Having some soreness but it is manageable   Objective: Vital signs in last 24 hours: Temp:  [97.6 F (36.4 C)-98.1 F (36.7 C)] 97.9 F (36.6 C) (10/05 0236) Pulse Rate:  [74-104] 74 (10/05 0236) Resp:  [10-23] 18 (10/05 0236) BP: (104-135)/(59-85) 104/59 (10/05 0236) SpO2:  [95 %-100 %] 100 % (10/05 0236) Weight:  [53.5 kg (118 lb)] 53.5 kg (118 lb) (10/04 0806) Last BM Date: 02/14/17  Intake/Output from previous day: 10/04 0701 - 10/05 0700 In: 2137.5 [P.O.:240; I.V.:1897.5] Out: 725 [Urine:600; Drains:75; Blood:50] Intake/Output this shift: Total I/O In: 737.5 [P.O.:240; I.V.:497.5] Out: 625 [Urine:600; Drains:25]  General appearance: alert and cooperative Resp: clear to auscultation bilaterally Cardio: regular rate and rhythm GI: soft, non-tender; bowel sounds normal; no masses,  no organomegaly  Lab Results:   Recent Labs  02/14/17 0747  WBC 6.2  HGB 13.1  HCT 39.8  PLT 268   BMET  Recent Labs  02/14/17 0747  NA 137  K 4.7  CL 105  CO2 23  GLUCOSE 103*  BUN 8  CREATININE 0.91  CALCIUM 9.1   PT/INR No results for input(s): LABPROT, INR in the last 72 hours. ABG No results for input(s): PHART, HCO3 in the last 72 hours.  Invalid input(s): PCO2, PO2  Studies/Results: Ct Chest W Contrast  Result Date: 02/13/2017 CLINICAL DATA:  History of invasive right-sided breast cancer with bilateral mastectomies. EXAM: CT CHEST, ABDOMEN, AND PELVIS WITH CONTRAST TECHNIQUE: Multidetector CT imaging of the chest, abdomen and pelvis was performed following the standard protocol during bolus administration of intravenous contrast. CONTRAST:  112mL ISOVUE-300 IOPAMIDOL (ISOVUE-300) INJECTION 61% COMPARISON:  None. FINDINGS: CT CHEST FINDINGS Cardiovascular: The heart is normal in size. No pericardial effusion. The aorta is normal in caliber. No atherosclerotic calcifications or dissection. The branch vessels are patent. No  definite coronary artery calcifications. Mediastinum/Nodes: No mediastinal or hilar mass or lymphadenopathy. The esophagus is grossly normal. Lungs/Pleura: No acute pulmonary findings. No infiltrates, edema or effusions. A few small calcified granulomas are noted. 3 mm right middle lobe nodule on image number 108 is likely benign. Attention on future scans is suggested. No other pulmonary lesions. Chest wall/ Musculoskeletal: There is a pectus deformity with mild mass effect on the right ventricle. Associated asymmetry of the ribs. Surgical changes from bilateral mastectomies with lymph node dissections. Symmetric appearing chest wall densities is likely postoperative change. I do not see any findings suspicious for chest wall tumor or axillary adenopathy. No supraclavicular adenopathy. The thyroid gland appears normal. The bony structures are intact. No worrisome lytic or sclerotic bone lesions to suggest osseous metastatic disease. CT ABDOMEN PELVIS FINDINGS Hepatobiliary: There are few small scattered low-attenuation lesions in the liver. 6.5 mm lesion in segment 2 is indeterminate. 7.5 mm lesion in segment 4B measures as a simple cyst. There are also sub 2 mm nodules in segment 8 and segment 6 which are too small to accurately characterize. Attention on follow-up scans is recommended. A small calcified gallstone is noted the gallbladder. No common bile duct dilatation. Pancreas: No mass, inflammation or ductal dilatation. Spleen: Small calcified granulomas but normal size and no worrisome lesions. Adrenals/Urinary Tract: The adrenal glands and kidneys are unremarkable. No renal, ureteral or bladder calculi or mass. Stomach/Bowel: The stomach, duodenum, small bowel and colon are unremarkable. No acute inflammatory changes, mass lesions or obstructive findings. The terminal ileum appears normal. The appendix is surgically absent. Vascular/Lymphatic: The aorta is normal in caliber.  No dissection. Scattered  calcifications. The branch vessels are patent. The major venous structures are patent. No mesenteric or retroperitoneal mass or adenopathy. Small scattered lymph nodes are noted. Reproductive: Status post hysterectomy. Both ovaries are still present. There are bilateral simple appearing ovarian cysts. Other: No ascites or abdominal wall hernia. No inguinal mass or adenopathy. No subcutaneous lesions. Musculoskeletal: No significant bony findings. There is a scoliotic lesion in the T8 vertebral body on the left side which is likely a benign bone island. Small sclerotic bone lesions are also noted in the iliac bones bilaterally. These are likely benign bone islands but recommend attention on future scans and correlation with upcoming bone scan. IMPRESSION: 1. Surgical changes from bilateral mastectomies without findings for chest wall tumor or axillary adenopathy. 2. 3 mm right middle lobe pulmonary nodule is indeterminate but likely benign. Attention on future scans is suggested. Small calcified granulomas are also noted. 3. Small indeterminate liver lesions, likely benign cysts but attention on future studies is suggested. 4. Bilateral ovarian cysts. 5. A few small sclerotic bone lesions as discussed above, likely benign bone islands but attention on future studies is suggested along with correlation with scheduled bone scan. Electronically Signed   By: Marijo Sanes M.D.   On: 02/13/2017 15:32   Nm Bone Scan Whole Body  Result Date: 02/13/2017 CLINICAL DATA:  Malignant neoplasm of lower outer quadrant of right breast. EXAM: NUCLEAR MEDICINE WHOLE BODY BONE SCAN TECHNIQUE: Whole body anterior and posterior images were obtained approximately 3 hours after intravenous injection of radiopharmaceutical. RADIOPHARMACEUTICALS:  21.5 mCi Technetium-47m MDP IV COMPARISON:  None. FINDINGS: No abnormal uptake is noted on this exam. IMPRESSION: No definite evidence of osseous metastases. Electronically Signed   By: Marijo Conception, M.D.   On: 02/13/2017 15:38   Ct Abdomen Pelvis W Contrast  Result Date: 02/13/2017 CLINICAL DATA:  History of invasive right-sided breast cancer with bilateral mastectomies. EXAM: CT CHEST, ABDOMEN, AND PELVIS WITH CONTRAST TECHNIQUE: Multidetector CT imaging of the chest, abdomen and pelvis was performed following the standard protocol during bolus administration of intravenous contrast. CONTRAST:  16mL ISOVUE-300 IOPAMIDOL (ISOVUE-300) INJECTION 61% COMPARISON:  None. FINDINGS: CT CHEST FINDINGS Cardiovascular: The heart is normal in size. No pericardial effusion. The aorta is normal in caliber. No atherosclerotic calcifications or dissection. The branch vessels are patent. No definite coronary artery calcifications. Mediastinum/Nodes: No mediastinal or hilar mass or lymphadenopathy. The esophagus is grossly normal. Lungs/Pleura: No acute pulmonary findings. No infiltrates, edema or effusions. A few small calcified granulomas are noted. 3 mm right middle lobe nodule on image number 108 is likely benign. Attention on future scans is suggested. No other pulmonary lesions. Chest wall/ Musculoskeletal: There is a pectus deformity with mild mass effect on the right ventricle. Associated asymmetry of the ribs. Surgical changes from bilateral mastectomies with lymph node dissections. Symmetric appearing chest wall densities is likely postoperative change. I do not see any findings suspicious for chest wall tumor or axillary adenopathy. No supraclavicular adenopathy. The thyroid gland appears normal. The bony structures are intact. No worrisome lytic or sclerotic bone lesions to suggest osseous metastatic disease. CT ABDOMEN PELVIS FINDINGS Hepatobiliary: There are few small scattered low-attenuation lesions in the liver. 6.5 mm lesion in segment 2 is indeterminate. 7.5 mm lesion in segment 4B measures as a simple cyst. There are also sub 2 mm nodules in segment 8 and segment 6 which are too small to  accurately characterize. Attention on follow-up scans is recommended. A small  calcified gallstone is noted the gallbladder. No common bile duct dilatation. Pancreas: No mass, inflammation or ductal dilatation. Spleen: Small calcified granulomas but normal size and no worrisome lesions. Adrenals/Urinary Tract: The adrenal glands and kidneys are unremarkable. No renal, ureteral or bladder calculi or mass. Stomach/Bowel: The stomach, duodenum, small bowel and colon are unremarkable. No acute inflammatory changes, mass lesions or obstructive findings. The terminal ileum appears normal. The appendix is surgically absent. Vascular/Lymphatic: The aorta is normal in caliber. No dissection. Scattered calcifications. The branch vessels are patent. The major venous structures are patent. No mesenteric or retroperitoneal mass or adenopathy. Small scattered lymph nodes are noted. Reproductive: Status post hysterectomy. Both ovaries are still present. There are bilateral simple appearing ovarian cysts. Other: No ascites or abdominal wall hernia. No inguinal mass or adenopathy. No subcutaneous lesions. Musculoskeletal: No significant bony findings. There is a scoliotic lesion in the T8 vertebral body on the left side which is likely a benign bone island. Small sclerotic bone lesions are also noted in the iliac bones bilaterally. These are likely benign bone islands but recommend attention on future scans and correlation with upcoming bone scan. IMPRESSION: 1. Surgical changes from bilateral mastectomies without findings for chest wall tumor or axillary adenopathy. 2. 3 mm right middle lobe pulmonary nodule is indeterminate but likely benign. Attention on future scans is suggested. Small calcified granulomas are also noted. 3. Small indeterminate liver lesions, likely benign cysts but attention on future studies is suggested. 4. Bilateral ovarian cysts. 5. A few small sclerotic bone lesions as discussed above, likely benign bone  islands but attention on future studies is suggested along with correlation with scheduled bone scan. Electronically Signed   By: Marijo Sanes M.D.   On: 02/13/2017 15:32   Dg Chest Port 1 View  Result Date: 02/14/2017 CLINICAL DATA:  51 year old female status post Port-A-Cath insertion. EXAM: PORTABLE CHEST 1 VIEW COMPARISON:  Chest CT 02/13/2017. FINDINGS: Left-sided subclavian single-lumen porta cath with tip terminating at the superior cavoatrial junction. Lung volumes are normal. No consolidative airspace disease. No pleural effusions. No pneumothorax. No pulmonary nodule or mass noted. Pulmonary vasculature and the cardiomediastinal silhouette are within normal limits. Surgical clips in the axillary regions bilaterally related to prior bilateral axillary nodal dissection. There appears to be a surgical drain in the superficial soft tissues of along the right chest wall. IMPRESSION: 1. New left subclavian Port-A-Cath with tip terminating in the superior cavoatrial junction. 2. No radiographic evidence of acute cardiopulmonary disease. Electronically Signed   By: Vinnie Langton M.D.   On: 02/14/2017 13:49   Dg Fluoro Guide Cv Line-no Report  Result Date: 02/14/2017 Fluoroscopy was utilized by the requesting physician.  No radiographic interpretation.    Anti-infectives: Anti-infectives    Start     Dose/Rate Route Frequency Ordered Stop   02/14/17 2000  azithromycin (ZITHROMAX) tablet 250 mg    Comments:  Z pack 500 x1 , then 250 x 4 days - started 10/2     250 mg Oral Daily 02/14/17 1949 02/17/17 1959   02/14/17 0748  ceFAZolin (ANCEF) IVPB 2g/100 mL premix     2 g 200 mL/hr over 30 Minutes Intravenous On call to O.R. 02/14/17 0748 02/14/17 1050   02/14/17 0726  ceFAZolin (ANCEF) 2-4 GM/100ML-% IVPB    Comments:  Laurita Quint   : cabinet override      02/14/17 0726 02/14/17 1035      Assessment/Plan: s/p Procedure(s): RIGHT AXILLARY LYMPH NODE DISSECTION ERAS PATHWAY  (  Right) INSERTION PORT-A-CATH (Left) RE-EXCISION INFERIOR FLAP (N/A) Advance diet Discharge  LOS: 0 days    TOTH III,PAUL S 02/15/2017

## 2017-03-06 ENCOUNTER — Ambulatory Visit (HOSPITAL_BASED_OUTPATIENT_CLINIC_OR_DEPARTMENT_OTHER): Payer: PRIVATE HEALTH INSURANCE | Admitting: Hematology and Oncology

## 2017-03-06 ENCOUNTER — Ambulatory Visit: Payer: PRIVATE HEALTH INSURANCE

## 2017-03-06 ENCOUNTER — Encounter: Payer: Self-pay | Admitting: *Deleted

## 2017-03-06 ENCOUNTER — Ambulatory Visit (HOSPITAL_BASED_OUTPATIENT_CLINIC_OR_DEPARTMENT_OTHER): Payer: PRIVATE HEALTH INSURANCE

## 2017-03-06 ENCOUNTER — Other Ambulatory Visit (HOSPITAL_BASED_OUTPATIENT_CLINIC_OR_DEPARTMENT_OTHER): Payer: PRIVATE HEALTH INSURANCE

## 2017-03-06 ENCOUNTER — Encounter: Payer: Self-pay | Admitting: Physical Therapy

## 2017-03-06 VITALS — BP 111/79 | HR 84

## 2017-03-06 DIAGNOSIS — Z5111 Encounter for antineoplastic chemotherapy: Secondary | ICD-10-CM | POA: Diagnosis not present

## 2017-03-06 DIAGNOSIS — C773 Secondary and unspecified malignant neoplasm of axilla and upper limb lymph nodes: Secondary | ICD-10-CM

## 2017-03-06 DIAGNOSIS — Z170 Estrogen receptor positive status [ER+]: Principal | ICD-10-CM

## 2017-03-06 DIAGNOSIS — R911 Solitary pulmonary nodule: Secondary | ICD-10-CM | POA: Diagnosis not present

## 2017-03-06 DIAGNOSIS — M899 Disorder of bone, unspecified: Secondary | ICD-10-CM | POA: Diagnosis not present

## 2017-03-06 DIAGNOSIS — C50511 Malignant neoplasm of lower-outer quadrant of right female breast: Secondary | ICD-10-CM

## 2017-03-06 DIAGNOSIS — N83209 Unspecified ovarian cyst, unspecified side: Secondary | ICD-10-CM

## 2017-03-06 DIAGNOSIS — K7689 Other specified diseases of liver: Secondary | ICD-10-CM

## 2017-03-06 LAB — COMPREHENSIVE METABOLIC PANEL
ALBUMIN: 4 g/dL (ref 3.5–5.0)
ALK PHOS: 46 U/L (ref 40–150)
ALT: 13 U/L (ref 0–55)
AST: 20 U/L (ref 5–34)
Anion Gap: 8 mEq/L (ref 3–11)
BUN: 8.5 mg/dL (ref 7.0–26.0)
CO2: 25 meq/L (ref 22–29)
Calcium: 9.2 mg/dL (ref 8.4–10.4)
Chloride: 106 mEq/L (ref 98–109)
Creatinine: 0.9 mg/dL (ref 0.6–1.1)
EGFR: >60 mL/min/{1.73_m2} (ref >60)
GLUCOSE: 112 mg/dL (ref 70–140)
POTASSIUM: 3.9 meq/L (ref 3.5–5.1)
SODIUM: 139 meq/L (ref 136–145)
TOTAL PROTEIN: 6.9 g/dL (ref 6.4–8.3)
Total Bilirubin: 0.46 mg/dL (ref 0.20–1.20)

## 2017-03-06 LAB — CBC WITH DIFFERENTIAL/PLATELET
BASO%: 1.4 % (ref 0.0–2.0)
Basophils Absolute: 0.1 10*3/uL (ref 0.0–0.1)
EOS%: 1.2 % (ref 0.0–7.0)
Eosinophils Absolute: 0.1 10*3/uL (ref 0.0–0.5)
HCT: 38.2 % (ref 34.8–46.6)
HEMOGLOBIN: 12.5 g/dL (ref 11.6–15.9)
LYMPH#: 1.3 10*3/uL (ref 0.9–3.3)
LYMPH%: 30.9 % (ref 14.0–49.7)
MCH: 29.8 pg (ref 25.1–34.0)
MCHC: 32.7 g/dL (ref 31.5–36.0)
MCV: 91 fL (ref 79.5–101.0)
MONO#: 0.5 10*3/uL (ref 0.1–0.9)
MONO%: 12.1 % (ref 0.0–14.0)
NEUT#: 2.4 10*3/uL (ref 1.5–6.5)
NEUT%: 54.4 % (ref 38.4–76.8)
NRBC: 0 % (ref 0–0)
Platelets: 232 10*3/uL (ref 145–400)
RBC: 4.2 10*6/uL (ref 3.70–5.45)
RDW: 12.7 % (ref 11.2–14.5)
WBC: 4.3 10*3/uL (ref 3.9–10.3)

## 2017-03-06 MED ORDER — SODIUM CHLORIDE 0.9 % IV SOLN
Freq: Once | INTRAVENOUS | Status: AC
  Administered 2017-03-06: 11:00:00 via INTRAVENOUS
  Filled 2017-03-06: qty 5

## 2017-03-06 MED ORDER — DIPHENHYDRAMINE HCL 50 MG/ML IJ SOLN
INTRAMUSCULAR | Status: AC
  Filled 2017-03-06: qty 1

## 2017-03-06 MED ORDER — PALONOSETRON HCL INJECTION 0.25 MG/5ML
INTRAVENOUS | Status: AC
  Filled 2017-03-06: qty 5

## 2017-03-06 MED ORDER — LORAZEPAM 1 MG PO TABS: 1.0000 mg | ORAL_TABLET | Freq: Three times a day (TID) | ORAL | 0 refills | Status: DC

## 2017-03-06 MED ORDER — SODIUM CHLORIDE 0.9 % IV SOLN
Freq: Once | INTRAVENOUS | Status: AC
  Administered 2017-03-06: 11:00:00 via INTRAVENOUS

## 2017-03-06 MED ORDER — DOXORUBICIN HCL CHEMO IV INJECTION 2 MG/ML
60.0000 mg/m2 | Freq: Once | INTRAVENOUS | Status: AC
  Administered 2017-03-06: 94 mg via INTRAVENOUS
  Filled 2017-03-06: qty 47

## 2017-03-06 MED ORDER — HEPARIN SOD (PORK) LOCK FLUSH 100 UNIT/ML IV SOLN
500.0000 [IU] | Freq: Once | INTRAVENOUS | Status: AC | PRN
  Administered 2017-03-06: 500 [IU]
  Filled 2017-03-06: qty 5

## 2017-03-06 MED ORDER — DIPHENHYDRAMINE HCL 50 MG/ML IJ SOLN
25.0000 mg | Freq: Once | INTRAMUSCULAR | Status: AC
  Administered 2017-03-06: 25 mg via INTRAVENOUS

## 2017-03-06 MED ORDER — SODIUM CHLORIDE 0.9% FLUSH
10.0000 mL | INTRAVENOUS | Status: DC | PRN
  Administered 2017-03-06: 10 mL
  Filled 2017-03-06: qty 10

## 2017-03-06 MED ORDER — PALONOSETRON HCL INJECTION 0.25 MG/5ML
0.2500 mg | Freq: Once | INTRAVENOUS | Status: AC
  Administered 2017-03-06: 0.25 mg via INTRAVENOUS

## 2017-03-06 MED ORDER — PEGFILGRASTIM 6 MG/0.6ML ~~LOC~~ PSKT
6.0000 mg | PREFILLED_SYRINGE | Freq: Once | SUBCUTANEOUS | Status: AC
  Administered 2017-03-06: 6 mg via SUBCUTANEOUS
  Filled 2017-03-06: qty 0.6

## 2017-03-06 MED ORDER — LORAZEPAM 1 MG PO TABS
1.0000 mg | ORAL_TABLET | Freq: Once | ORAL | Status: AC
  Administered 2017-03-06: 1 mg via ORAL

## 2017-03-06 MED ORDER — LORAZEPAM 1 MG PO TABS
ORAL_TABLET | ORAL | Status: AC
  Filled 2017-03-06: qty 1

## 2017-03-06 MED ORDER — SODIUM CHLORIDE 0.9 % IV SOLN
600.0000 mg/m2 | Freq: Once | INTRAVENOUS | Status: AC
  Administered 2017-03-06: 940 mg via INTRAVENOUS
  Filled 2017-03-06: qty 47

## 2017-03-06 NOTE — Progress Notes (Signed)
Adriamycin started at 1136.  One syringe (41mls, 23.5mg ) given over 11 minutes d/t pt feeling like her heart was racing.  Pulse rate monitored through med push with pulse ranging from 88-97. Dr Lindi Adie notified of issue and 1mg  PO ativan ordered and given at 1200.  Plan was to wait at least 20 minutes after Ativan to start 2nd syringe of Adria.  At approx 1207 pt stated her neck felt "swollen". Dr Lindi Adie notified and 25 mg IV Benadryl ordered and given at 1210.  Plan to resume Adria at 1230. Second syringe of Adria given from [phone].  Pulse 91-101.  Pt stated at times she felt that her heart was pounding but it would resolve and then return. Cytoxan run with no problems.  Pt drowsy but easily arousable.  Pulse remained in the 80s.

## 2017-03-06 NOTE — Assessment & Plan Note (Signed)
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 adjuvant chemotherapy with dose dense Adriamycin and Cytoxan x4 followed by Taxol weekly x12 3. Adjuvant radiation 4. Adjuvant antiestrogen therapy with tamoxifen (which was originally started prior to surgery) ---------------------------------------------------------------------- Current treatment: Cycle 1 day 1 dose dense Adriamycin Cytoxan Antiemetics were reviewed Chemotherapy consent obtained Chemotherapy education completed Echocardiogram 02/13/2017: EF 55-60% Closely monitoring for chemotherapy toxicities. Return to clinic in one week for toxicity check

## 2017-03-06 NOTE — Patient Instructions (Signed)
Implanted Port Home Guide An implanted port is a type of central line that is placed under the skin. Central lines are used to provide IV access when treatment or nutrition needs to be given through a person's veins. Implanted ports are used for long-term IV access. An implanted port may be placed because:  You need IV medicine that would be irritating to the small veins in your hands or arms.  You need long-term IV medicines, such as antibiotics.  You need IV nutrition for a long period.  You need frequent blood draws for lab tests.  You need dialysis.  Implanted ports are usually placed in the chest area, but they can also be placed in the upper arm, the abdomen, or the leg. An implanted port has two main parts:  Reservoir. The reservoir is round and will appear as a small, raised area under your skin. The reservoir is the part where a needle is inserted to give medicines or draw blood.  Catheter. The catheter is a thin, flexible tube that extends from the reservoir. The catheter is placed into a large vein. Medicine that is inserted into the reservoir goes into the catheter and then into the vein.  How will I care for my incision site? Do not get the incision site wet. Bathe or shower as directed by your health care provider. How is my port accessed? Special steps must be taken to access the port:  Before the port is accessed, a numbing cream can be placed on the skin. This helps numb the skin over the port site.  Your health care provider uses a sterile technique to access the port. ? Your health care provider must put on a mask and sterile gloves. ? The skin over your port is cleaned carefully with an antiseptic and allowed to dry. ? The port is gently pinched between sterile gloves, and a needle is inserted into the port.  Only "non-coring" port needles should be used to access the port. Once the port is accessed, a blood return should be checked. This helps ensure that the port  is in the vein and is not clogged.  If your port needs to remain accessed for a constant infusion, a clear (transparent) bandage will be placed over the needle site. The bandage and needle will need to be changed every week, or as directed by your health care provider.  Keep the bandage covering the needle clean and dry. Do not get it wet. Follow your health care provider's instructions on how to take a shower or bath while the port is accessed.  If your port does not need to stay accessed, no bandage is needed over the port.  What is flushing? Flushing helps keep the port from getting clogged. Follow your health care provider's instructions on how and when to flush the port. Ports are usually flushed with saline solution or a medicine called heparin. The need for flushing will depend on how the port is used.  If the port is used for intermittent medicines or blood draws, the port will need to be flushed: ? After medicines have been given. ? After blood has been drawn. ? As part of routine maintenance.  If a constant infusion is running, the port may not need to be flushed.  How long will my port stay implanted? The port can stay in for as long as your health care provider thinks it is needed. When it is time for the port to come out, surgery will be   done to remove it. The procedure is similar to the one performed when the port was put in. When should I seek immediate medical care? When you have an implanted port, you should seek immediate medical care if:  You notice a bad smell coming from the incision site.  You have swelling, redness, or drainage at the incision site.  You have more swelling or pain at the port site or the surrounding area.  You have a fever that is not controlled with medicine.  This information is not intended to replace advice given to you by your health care provider. Make sure you discuss any questions you have with your health care provider. Document  Released: 04/30/2005 Document Revised: 10/06/2015 Document Reviewed: 01/05/2013 [employer] Interactive Patient Education  2017 [employer]  

## 2017-03-06 NOTE — Patient Instructions (Signed)
[facility] Discharge Instructions for Patients Receiving Chemotherapy  Today you received the following chemotherapy agents:  Adramycin (doxorubicin), Cytoxan (cyclophosphamide  To help prevent nausea and vomiting after your treatment, we encourage you to take your nausea medication as prescribed.   If you develop nausea and vomiting that is not controlled by your nausea medication, call the clinic.   BELOW ARE SYMPTOMS THAT SHOULD BE REPORTED IMMEDIATELY:  *FEVER GREATER THAN 100.5 F  *CHILLS WITH OR WITHOUT FEVER  NAUSEA AND VOMITING THAT IS NOT CONTROLLED WITH YOUR NAUSEA MEDICATION  *UNUSUAL SHORTNESS OF BREATH  *UNUSUAL BRUISING OR BLEEDING  TENDERNESS IN MOUTH AND THROAT WITH OR WITHOUT PRESENCE OF ULCERS  *URINARY PROBLEMS  *BOWEL PROBLEMS  UNUSUAL RASH Items with * indicate a potential emergency and should be followed up as soon as possible.  Feel free to call the clinic should you have any questions or concerns. The clinic phone number is [phone].  Please show the [place] at check-in to the Emergency Department and triage nurse.    Fosaprepitant injection What is this medicine? FOSAPREPITANT (fos ap RE pi tant) is used together with other medicines to prevent nausea and vomiting caused by cancer treatment (chemotherapy). This medicine may be used for other purposes; ask your health care provider or pharmacist if you have questions. COMMON BRAND NAME(S): Emend What should I tell my health care provider before I take this medicine? They need to know if you have any of these conditions: -liver disease -an unusual or allergic reaction to fosaprepitant, aprepitant, medicines, foods, dyes, or preservatives -pregnant or trying to get pregnant -breast-feeding How should I use this medicine? This medicine is for injection into a vein. It is given by a health care professional in a hospital or clinic setting. Talk to your pediatrician  regarding the use of this medicine in children. Special care may be needed. Overdosage: If you think you have taken too much of this medicine contact a poison control center or emergency room at once. NOTE: This medicine is only for you. Do not share this medicine with others. What if I miss a dose? This does not apply. What may interact with this medicine? Do not take this medicine with any of these medicines: -cisapride -flibanserin -lomitapide -pimozide This medicine may also interact with the following medications: -diltiazem -female hormones, like estrogens or progestins and birth control pills -medicines for fungal infections like ketoconazole and itraconazole -medicines for HIV -medicines for seizures or to control epilepsy like carbamazepine or phenytoin -medicines used for sleep or anxiety disorders like alprazolam, diazepam, or midazolam -nefazodone -paroxetine -ranolazine -rifampin -some chemotherapy medications like etoposide, ifosfamide, vinblastine, vincristine -some antibiotics like clarithromycin, erythromycin, troleandomycin -steroid medicines like dexamethasone or methylprednisolone -tolbutamide -warfarin This list may not describe all possible interactions. Give your health care provider a list of all the medicines, herbs, non-prescription drugs, or dietary supplements you use. Also tell them if you smoke, drink alcohol, or use illegal drugs. Some items may interact with your medicine. What should I watch for while using this medicine? Do not take this medicine if you already have nausea and vomiting. Ask your health care provider what to do if you already have nausea. Birth control pills and other methods of hormonal contraception (for example, IUD or patch) may not work properly while you are taking this medicine. Use an extra method of birth control during treatment and for 1 month after your last dose of fosaprepitant. This medicine should not be  used  continuously for a long time. Visit your doctor or health care professional for regular check-ups. This medicine may change your liver function blood test results. What side effects may I notice from receiving this medicine? Side effects that you should report to your doctor or health care professional as soon as possible: -allergic reactions like skin rash, itching or hives, swelling of the face, lips, or tongue -breathing problems -changes in heart rhythm -high or low blood pressure -pain, redness, or irritation at site where injected -rectal bleeding -serious dizziness or disorientation, confusion -sharp or severe stomach pain -sharp pain in your leg Side effects that usually do not require medical attention (report to your doctor or health care professional if they continue or are bothersome): -constipation or diarrhea -hair loss -headache -hiccups -loss of appetite -nausea -upset stomach -tiredness This list may not describe all possible side effects. Call your doctor for medical advice about side effects. You may report side effects to FDA at 1-800-FDA-1088. Where should I keep my medicine? This drug is given in a hospital or clinic and will not be stored at home. NOTE: This sheet is a summary. It may not cover all possible information. If you have questions about this medicine, talk to your doctor, pharmacist, or health care provider.  2018 [employer]vier/Gold Standard (2014-06-16 10:45:34)   Cyclophosphamide injection What is this medicine? CYCLOPHOSPHAMIDE (sye kloe FOSS fa mide) is a chemotherapy drug. It slows the growth of cancer cells. This medicine is used to treat many types of cancer like lymphoma, myeloma, leukemia, breast cancer, and ovarian cancer, to name a few. This medicine may be used for other purposes; ask your health care provider or pharmacist if you have questions. COMMON BRAND NAME(S): Cytoxan, Neosar What should I tell my health care provider before I take  this medicine? They need to know if you have any of these conditions: -blood disorders -history of other chemotherapy -infection -kidney disease -liver disease -recent or ongoing radiation therapy -tumors in the bone marrow -an unusual or allergic reaction to cyclophosphamide, other chemotherapy, other medicines, foods, dyes, or preservatives -pregnant or trying to get pregnant -breast-feeding How should I use this medicine? This drug is usually given as an injection into a vein or muscle or by infusion into a vein. It is administered in a hospital or clinic by a specially trained health care professional. Talk to your pediatrician regarding the use of this medicine in children. Special care may be needed. Overdosage: If you think you have taken too much of this medicine contact a poison control center or emergency room at once. NOTE: This medicine is only for you. Do not share this medicine with others. What if I miss a dose? It is important not to miss your dose. Call your doctor or health care professional if you are unable to keep an appointment. What may interact with this medicine? This medicine may interact with the following medications: -amiodarone -amphotericin B -azathioprine -certain antiviral medicines for HIV or AIDS such as protease inhibitors (e.g., indinavir, ritonavir) and zidovudine -certain blood pressure medications such as benazepril, captopril, enalapril, fosinopril, lisinopril, moexipril, monopril, perindopril, quinapril, ramipril, trandolapril -certain cancer medications such as anthracyclines (e.g., daunorubicin, doxorubicin), busulfan, cytarabine, paclitaxel, pentostatin, tamoxifen, trastuzumab -certain diuretics such as chlorothiazide, chlorthalidone, hydrochlorothiazide, indapamide, metolazone -certain medicines that treat or prevent blood clots like warfarin -certain muscle relaxants such as  succinylcholine -cyclosporine -etanercept -indomethacin -medicines to increase blood counts like filgrastim, pegfilgrastim, sargramostim -medicines used as general anesthesia -metronidazole -natalizumab This  list may not describe all possible interactions. Give your health care provider a list of all the medicines, herbs, non-prescription drugs, or dietary supplements you use. Also tell them if you smoke, drink alcohol, or use illegal drugs. Some items may interact with your medicine. What should I watch for while using this medicine? Visit your doctor for checks on your progress. This drug may make you feel generally unwell. This is not uncommon, as chemotherapy can affect healthy cells as well as cancer cells. Report any side effects. Continue your course of treatment even though you feel ill unless your doctor tells you to stop. Drink water or other fluids as directed. Urinate often, even at night. In some cases, you may be given additional medicines to help with side effects. Follow all directions for their use. Call your doctor or health care professional for advice if you get a fever, chills or sore throat, or other symptoms of a cold or flu. Do not treat yourself. This drug decreases your body's ability to fight infections. Try to avoid being around people who are sick. This medicine may increase your risk to bruise or bleed. Call your doctor or health care professional if you notice any unusual bleeding. Be careful brushing and flossing your teeth or using a toothpick because you may get an infection or bleed more easily. If you have any dental work done, tell your dentist you are receiving this medicine. You may get drowsy or dizzy. Do not drive, use machinery, or do anything that needs mental alertness until you know how this medicine affects you. Do not become pregnant while taking this medicine or for 1 year after stopping it. Women should inform their doctor if they wish to become  pregnant or think they might be pregnant. Men should not father a child while taking this medicine and for 4 months after stopping it. There is a potential for serious side effects to an unborn child. Talk to your health care professional or pharmacist for more information. Do not breast-feed an infant while taking this medicine. This medicine may interfere with the ability to have a child. This medicine has caused ovarian failure in some women. This medicine has caused reduced sperm counts in some men. You should talk with your doctor or health care professional if you are concerned about your fertility. If you are going to have surgery, tell your doctor or health care professional that you have taken this medicine. What side effects may I notice from receiving this medicine? Side effects that you should report to your doctor or health care professional as soon as possible: -allergic reactions like skin rash, itching or hives, swelling of the face, lips, or tongue -low blood counts - this medicine may decrease the number of white blood cells, red blood cells and platelets. You may be at increased risk for infections and bleeding. -signs of infection - fever or chills, cough, sore throat, pain or difficulty passing urine -signs of decreased platelets or bleeding - bruising, pinpoint red spots on the skin, black, tarry stools, blood in the urine -signs of decreased red blood cells - unusually weak or tired, fainting spells, lightheadedness -breathing problems -dark urine -dizziness -palpitations -swelling of the ankles, feet, hands -trouble passing urine or change in the amount of urine -weight gain -yellowing of the eyes or skin Side effects that usually do not require medical attention (report to your doctor or health care professional if they continue or are bothersome): -changes in nail or skin color -  hair loss -missed menstrual periods -mouth sores -nausea, vomiting This list may not  describe all possible side effects. Call your doctor for medical advice about side effects. You may report side effects to FDA at 1-800-FDA-1088. Where should I keep my medicine? This drug is given in a hospital or clinic and will not be stored at home. NOTE: This sheet is a summary. It may not cover all possible information. If you have questions about this medicine, talk to your doctor, pharmacist, or health care provider.  2018 Elsevier/Gold Standard (2012-03-14 16:22:58)   Doxorubicin injection What is this medicine? DOXORUBICIN (dox oh ROO bi sin) is a chemotherapy drug. It is used to treat many kinds of cancer like leukemia, lymphoma, neuroblastoma, sarcoma, and Wilms' tumor. It is also used to treat bladder cancer, breast cancer, lung cancer, ovarian cancer, stomach cancer, and thyroid cancer. This medicine may be used for other purposes; ask your health care provider or pharmacist if you have questions. COMMON BRAND NAME(S): Adriamycin, Adriamycin PFS, Adriamycin RDF, Rubex What should I tell my health care provider before I take this medicine? They need to know if you have any of these conditions: -heart disease -history of low blood counts caused by a medicine -liver disease -recent or ongoing radiation therapy -an unusual or allergic reaction to doxorubicin, other chemotherapy agents, other medicines, foods, dyes, or preservatives -pregnant or trying to get pregnant -breast-feeding How should I use this medicine? This drug is given as an infusion into a vein. It is administered in a hospital or clinic by a specially trained health care professional. If you have pain, swelling, burning or any unusual feeling around the site of your injection, tell your health care professional right away. Talk to your pediatrician regarding the use of this medicine in children. Special care may be needed. Overdosage: If you think you have taken too much of this medicine contact a poison control  center or emergency room at once. NOTE: This medicine is only for you. Do not share this medicine with others. What if I miss a dose? It is important not to miss your dose. Call your doctor or health care professional if you are unable to keep an appointment. What may interact with this medicine? This medicine may interact with the following medications: -6-mercaptopurine -paclitaxel -phenytoin -St. John's Wort -trastuzumab -verapamil This list may not describe all possible interactions. Give your health care provider a list of all the medicines, herbs, non-prescription drugs, or dietary supplements you use. Also tell them if you smoke, drink alcohol, or use illegal drugs. Some items may interact with your medicine. What should I watch for while using this medicine? This drug may make you feel generally unwell. This is not uncommon, as chemotherapy can affect healthy cells as well as cancer cells. Report any side effects. Continue your course of treatment even though you feel ill unless your doctor tells you to stop. There is a maximum amount of this medicine you should receive throughout your life. The amount depends on the medical condition being treated and your overall health. Your doctor will watch how much of this medicine you receive in your lifetime. Tell your doctor if you have taken this medicine before. You may need blood work done while you are taking this medicine. Your urine may turn red for a few days after your dose. This is not blood. If your urine is dark or brown, call your doctor. In some cases, you may be given additional medicines to help with  side effects. Follow all directions for their use. Call your doctor or health care professional for advice if you get a fever, chills or sore throat, or other symptoms of a cold or flu. Do not treat yourself. This drug decreases your body's ability to fight infections. Try to avoid being around people who are sick. This medicine may  increase your risk to bruise or bleed. Call your doctor or health care professional if you notice any unusual bleeding. Talk to your doctor about your risk of cancer. You may be more at risk for certain types of cancers if you take this medicine. Do not become pregnant while taking this medicine or for 6 months after stopping it. Women should inform their doctor if they wish to become pregnant or think they might be pregnant. Men should not father a child while taking this medicine and for 6 months after stopping it. There is a potential for serious side effects to an unborn child. Talk to your health care professional or pharmacist for more information. Do not breast-feed an infant while taking this medicine. This medicine has caused ovarian failure in some women and reduced sperm counts in some men This medicine may interfere with the ability to have a child. Talk with your doctor or health care professional if you are concerned about your fertility. What side effects may I notice from receiving this medicine? Side effects that you should report to your doctor or health care professional as soon as possible: -allergic reactions like skin rash, itching or hives, swelling of the face, lips, or tongue -breathing problems -chest pain -fast or irregular heartbeat -low blood counts - this medicine may decrease the number of white blood cells, red blood cells and platelets. You may be at increased risk for infections and bleeding. -pain, redness, or irritation at site where injected -signs of infection - fever or chills, cough, sore throat, pain or difficulty passing urine -signs of decreased platelets or bleeding - bruising, pinpoint red spots on the skin, black, tarry stools, blood in the urine -swelling of the ankles, feet, hands -tiredness -weakness Side effects that usually do not require medical attention (report to your doctor or health care professional if they continue or are  bothersome): -diarrhea -hair loss -mouth sores -nail discoloration or damage -nausea -red colored urine -vomiting This list may not describe all possible side effects. Call your doctor for medical advice about side effects. You may report side effects to FDA at 1-800-FDA-1088. Where should I keep my medicine? This drug is given in a hospital or clinic and will not be stored at home. NOTE: This sheet is a summary. It may not cover all possible information. If you have questions about this medicine, talk to your doctor, pharmacist, or health care provider.  2018 [employer]/Gold Standard (2015-06-27 11:28:51)

## 2017-03-06 NOTE — Progress Notes (Signed)
Patient Care Team: Jettie Booze, NP as PCP - General (Family Medicine) Nicholas Lose, MD as Consulting Physician (Hematology and Oncology) Jovita Kussmaul, MD as Consulting Physician (General Surgery) Gery Pray, MD as Consulting Physician (Radiation Oncology)  DIAGNOSIS:  Encounter Diagnosis  Name Primary?  . 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, estrogen receptor positive ([place])     SUMMARY OF ONCOLOGIC HISTORY:   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, estrogen receptor positive ([place])   01/02/2017 Initial Diagnosis    Palpable right breast mass retroareolar 6:30 position: 3.6 cm size axilla negative, 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 HER-2 negative ratio 1.31 Ki-67 3% in addition calcifications UIQ 1.4 cm stereotactic biopsy flat epithelial atypia; clips are 4.3 cm apart, T2 N0 stage IB clinical stage AJCC 8      01/28/2017 Surgery    Bilateral mastectomies: Right: Grade 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%, PR 95%, HER-2 negative ratio 1.31, Ki-67 3%, 4/4 lymph nodes positive; left mastectomy: PASH and FC changes, no malignancy; T2 N2,  stage IIA AJCC 8      02/14/2017 Surgery    Right axillary lymph node dissection 8/11 lymph nodes positive      03/06/2017 -  Chemotherapy    Dose dense AC x4 followed by Taxol x12       CHIEF COMPLIANT: Cycle 1 day 1 dose dense Adriamycin and Cytoxan  INTERVAL HISTORY: Angelica Floyd is a 51 year old with above-mentioned history of right breast cancer underwent bilateral mastectomies and axillary lymph node dissection who is here today to receive her first cycle of adjuvant chemotherapy with dose dense Adriamycin and Cytoxan.  She is anxious to get started.  She has recovered from from the recent surgery on the axilla.  REVIEW OF SYSTEMS:   Constitutional: Denies fevers, chills or abnormal weight loss Eyes: Denies blurriness of vision Ears, nose, mouth, throat, and  face: Denies mucositis or sore throat Respiratory: Denies cough, dyspnea or wheezes Cardiovascular: Denies palpitation, chest discomfort Gastrointestinal:  Denies nausea, heartburn or change in bowel habits Skin: Denies abnormal skin rashes Lymphatics: Denies new lymphadenopathy or easy bruising Neurological:Denies numbness, tingling or new weaknesses Behavioral/Psych: Mood is stable, no new changes  Extremities: No lower extremity edema  All other systems were reviewed with the patient and are negative.  I have reviewed the past medical history, past surgical history, social history and family history with the patient and they are unchanged from previous note.  ALLERGIES:  has No Known Allergies.  MEDICATIONS:  Current Outpatient Prescriptions  Medication Sig Dispense Refill  . Aspirin-Salicylamide-Caffeine (BC HEADACHE POWDER PO) Take 1 packet by mouth daily as needed (headaches).    Marland Kitchen dexamethasone (DECADRON) 4 MG tablet Take 1 tablet (4 mg total) by mouth daily. Take 1 tablet daily after chemotherapy and one tablet 2 days after chemotherapy with food 8 tablet 0  . ketotifen (ZADITOR) 0.025 % ophthalmic solution Apply 1 drop to eye daily as needed (allergies).    Marland Kitchen lidocaine-prilocaine (EMLA) cream Apply to affected area once 30 g 3  . LORazepam (ATIVAN) 0.5 MG tablet Take 1 tablet (0.5 mg total) by mouth at bedtime. 30 tablet 0  . Multiple Vitamins-Minerals (CENTRUM ADULTS) TABS Take 1 tablet by mouth daily.     . ondansetron (ZOFRAN) 8 MG tablet Take 1 tablet (8 mg total) by mouth 2 (two) times daily as needed. Start on the third day after chemotherapy. Enlow  tablet 1  . prochlorperazine (COMPAZINE) 10 MG tablet Take 1 tablet (10 mg total) by mouth every 6 (six) hours as needed (Nausea or vomiting). 30 tablet 1   No current facility-administered medications for this visit.    Facility-Administered Medications Ordered in Other Visits  Medication Dose Route Frequency Provider Last Rate  Last Dose  . 0.9 %  sodium chloride infusion   Intravenous Once Nicholas Lose, MD      . cyclophosphamide (CYTOXAN) 940 mg in sodium chloride 0.9 % 250 mL chemo infusion  600 mg/m2 (Treatment Plan Recorded) Intravenous Once Nicholas Lose, MD      . DOXOrubicin (ADRIAMYCIN) chemo injection 94 mg  60 mg/m2 (Treatment Plan Recorded) Intravenous Once Nicholas Lose, MD      . fosaprepitant (EMEND) 150 mg, dexamethasone (DECADRON) 12 mg in sodium chloride 0.9 % 145 mL IVPB   Intravenous Once Nicholas Lose, MD      . heparin lock flush 100 unit/mL  500 Units Intracatheter Once PRN Nicholas Lose, MD      . palonosetron (ALOXI) injection 0.25 mg  0.25 mg Intravenous Once Nicholas Lose, MD      . pegfilgrastim (NEULASTA ONPRO KIT) injection 6 mg  6 mg Subcutaneous Once Nicholas Lose, MD      . sodium chloride flush (NS) 0.9 % injection 10 mL  10 mL Intracatheter PRN Nicholas Lose, MD        PHYSICAL EXAMINATION: ECOG PERFORMANCE STATUS: 1 - Symptomatic but completely ambulatory  Vitals:   03/06/17 0935  BP: (!) 125/97  Pulse: 84  Resp: 18  Temp: 98 F (36.7 C)  SpO2: 100%   Filed Weights   03/06/17 0935  Weight: 117 lb 4.8 oz (53.2 kg)    GENERAL:alert, no distress and comfortable SKIN: skin color, texture, turgor are normal, no rashes or significant lesions EYES: normal, Conjunctiva are pink and non-injected, sclera clear OROPHARYNX:no exudate, no erythema and lips, buccal mucosa, and tongue normal  NECK: supple, thyroid normal size, non-tender, without nodularity LYMPH:  no palpable lymphadenopathy in the cervical, axillary or inguinal LUNGS: clear to auscultation and percussion with normal breathing effort HEART: regular rate & rhythm and no murmurs and no lower extremity edema ABDOMEN:abdomen soft, non-tender and normal bowel sounds MUSCULOSKELETAL:no cyanosis of digits and no clubbing  NEURO: alert & oriented x 3 with fluent speech, no focal motor/sensory deficits EXTREMITIES: No  lower extremity edema  LABORATORY DATA:  I have reviewed the data as listed   Chemistry      Component Value Date/Time   NA 139 03/06/2017 0901   K 3.9 03/06/2017 0901   CL 105 02/14/2017 0747   CO2 25 03/06/2017 0901   BUN 8.5 03/06/2017 0901   CREATININE 0.9 03/06/2017 0901      Component Value Date/Time   CALCIUM 9.2 03/06/2017 0901   ALKPHOS 46 03/06/2017 0901   AST 20 03/06/2017 0901   ALT 13 03/06/2017 0901   BILITOT 0.46 03/06/2017 0901       Lab Results  Component Value Date   WBC 4.3 03/06/2017   HGB 12.5 03/06/2017   HCT 38.2 03/06/2017   MCV 91.0 03/06/2017   PLT 232 03/06/2017   NEUTROABS 2.4 03/06/2017    ASSESSMENT & PLAN:  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, estrogen receptor positive ([place]) 01/28/2017: Bilateral mastectomies: Right: Grade 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%, PR 95%, HER-2 negative ratio 1.31, Ki-67 3%, 4/4 lymph nodes positive; left mastectomy:  PASH and FC changes, no malignancy; T2 N2,  stage IIA AJCC 8 02-14-17: 8/10 lymph nodes positive  CT chest 02/13/2017: 3 mm right middle lobe nodule likely benign benign cysts in the liver, ovarian cysts few small sclerotic lesions in the bone likely bone islands Bone scan 02/13/2017: No bone metastases  Treatment plan: 1. adjuvant chemotherapy with dose dense Adriamycin and Cytoxan x4 followed by Taxol weekly x12 3. Adjuvant radiation 4. Adjuvant antiestrogen therapy with tamoxifen (which was originally started prior to surgery) ---------------------------------------------------------------------- Current treatment: Cycle 1 day 1 dose dense Adriamycin Cytoxan Antiemetics were reviewed Chemotherapy consent obtained Chemotherapy education completed Echocardiogram 02/13/2017: EF 55-60% Closely monitoring for chemotherapy toxicities. Return to clinic in one week for toxicity check  I spent 25 minutes talking to the patient of which more than half was spent in  counseling and coordination of care.  No orders of the defined types were placed in this encounter.  The patient has a good understanding of the overall plan. she agrees with it. she will call with any problems that may develop before the next visit here.   Rulon Eisenmenger, MD 03/06/17

## 2017-03-07 ENCOUNTER — Telehealth: Payer: Self-pay

## 2017-03-07 NOTE — Telephone Encounter (Addendum)
Spoke with pt this morning to let her know of her lab and md appt schedule next week and to follow up on her chemo yesterday. Pt states that she feels fine today and has been taking her home medication. Educated pt on oral hydration, nutrition, and hand hygiene and what to expect after a few days of treatment and when to take her medications. Pt verbalized understanding and confirmed appt time/date on Wednesday 10/31. Pt knows to call with any other concerns related to chemo. No further questions at this time.

## 2017-03-11 ENCOUNTER — Ambulatory Visit: Payer: PRIVATE HEALTH INSURANCE | Attending: General Surgery | Admitting: Physical Therapy

## 2017-03-11 ENCOUNTER — Ambulatory Visit: Payer: Self-pay | Admitting: Physical Therapy

## 2017-03-11 ENCOUNTER — Encounter: Payer: Self-pay | Admitting: Physical Therapy

## 2017-03-11 DIAGNOSIS — R293 Abnormal posture: Secondary | ICD-10-CM | POA: Insufficient documentation

## 2017-03-11 DIAGNOSIS — Z170 Estrogen receptor positive status [ER+]: Secondary | ICD-10-CM | POA: Diagnosis present

## 2017-03-11 DIAGNOSIS — Z483 Aftercare following surgery for neoplasm: Secondary | ICD-10-CM

## 2017-03-11 DIAGNOSIS — C50511 Malignant neoplasm of lower-outer quadrant of right female breast: Secondary | ICD-10-CM

## 2017-03-11 DIAGNOSIS — M25611 Stiffness of right shoulder, not elsewhere classified: Secondary | ICD-10-CM | POA: Diagnosis present

## 2017-03-11 DIAGNOSIS — M25612 Stiffness of left shoulder, not elsewhere classified: Secondary | ICD-10-CM | POA: Diagnosis present

## 2017-03-11 NOTE — Therapy (Signed)
[facility] [place], [place], [address] Phone: [phone]   Fax:  [phone]  Physical Therapy Evaluation  Patient Details  Name: Angelica Floyd MRN: [id number] Date of Birth: 1965-08-05 Referring Provider: Dr. Nicholas Lose  Encounter Date: 03/11/2017      PT End of Session - 03/11/17 0952    Visit Number 1   Number of Visits 8   Date for PT Re-Evaluation 04/08/17   PT Start Time 0902   PT Stop Time 0958   PT Time Calculation (min) 56 min   Activity Tolerance Patient tolerated treatment well   Behavior During Therapy [facility] for tasks assessed/performed      Past Medical History:  Diagnosis Date  . Anxiety    since breast cancer  . 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, estrogen receptor positive ([place]) 01/08/2017    Past Surgical History:  Procedure Laterality Date  . ABDOMINAL HYSTERECTOMY    . ADENOIDECTOMY W/ MYRINGOTOMY    . APPENDECTOMY    . AXILLARY LYMPH NODE DISSECTION Right 02/14/2017   Procedure: RIGHT AXILLARY LYMPH NODE DISSECTION ERAS PATHWAY;  Surgeon: Jovita Kussmaul, MD;  Location: [facility];  Service: General;  Laterality: Right;  . CESAREAN SECTION    . MASTECTOMY W/ SENTINEL NODE BIOPSY Bilateral 01/28/2017   RIGHT BREAST BIOPSY  . MASTECTOMY W/ SENTINEL NODE BIOPSY Bilateral 01/28/2017   Procedure: BILATERAL MASTECTOMY WITH RIGHT SENTINEL LYMPH NODE BIOPSY;  Surgeon: Jovita Kussmaul, MD;  Location: [facility];  Service: General;  Laterality: Bilateral;  . PORTACATH PLACEMENT Left 02/14/2017   Procedure: INSERTION PORT-A-CATH;  Surgeon: Jovita Kussmaul, MD;  Location: [facility];  Service: General;  Laterality: Left;  . RE-EXCISION OF BREAST CANCER,SUPERIOR MARGINS N/A 02/14/2017   Procedure: RE-EXCISION INFERIOR FLAP;  Surgeon: Jovita Kussmaul, MD;  Location: [facility];  Service: General;  Laterality: N/A;  . WISDOM TOOTH EXTRACTION      There were no vitals filed for this  visit.       Subjective Assessment - 03/11/17 0908    Subjective Patient underwent a bilateral mastectomy and sentinel node biopsy on the right on 01/28/17. All 4 nodes were positive. She had to have a re-excision and ALND on 02/14/17 and 8 of 11 nodes were positive. She is currently undergoing chemotherapy and will undergo radiation on the right chest and axilla and then Tamoxifen.    Pertinent History Bilateral mastectomy for right hormone receptive breast cancer with right ALNB 01/28/17 followed by re-excision and ALND 02/14/17. Undergoing chemotherapy.   Patient Stated Goals Improve arm function; prevent lymphedema   Currently in Pain? Yes  Also some numbness under right arm   Pain Location Chest   Pain Orientation Left   Pain Descriptors / Indicators --  "poking"   Pain Type Surgical pain  From port   Pain Onset In the past 7 days   Pain Frequency Intermittent   Aggravating Factors  Port-a-cath   Pain Relieving Factors unknown   Multiple Pain Sites No            [facility] PT Assessment - 03/11/17 0001      Assessment   Medical Diagnosis s/p bil mastectomy and ALND   Referring Provider Dr. Nicholas Lose   Onset Date/Surgical Date 02/14/17   Hand Dominance Right   Prior Therapy none     Precautions   Precautions Other (comment)   Precaution Comments active cancer     Restrictions  Weight Bearing Restrictions No     Balance Screen   Has the patient fallen in the past 6 months No   Has the patient had a decrease in activity level because of a fear of falling?  No   Is the patient reluctant to leave their home because of a fear of falling?  No     Home Environment   Living Environment Private residence   Living Arrangements Spouse/significant other;Children  Husband and 48 and 40 y.o. 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   Leisure She is walking 3-4 miles per day currently     Cognition   Overall Cognitive Status Within Functional Limits for tasks assessed     Posture/Postural Control   Posture/Postural Control Postural limitations   Postural Limitations Rounded Shoulders;Forward head     ROM / Strength   AROM / PROM / Strength AROM;Strength     AROM   AROM Assessment Site Shoulder;Cervical   Right/Left Shoulder Right;Left   Right Shoulder Extension 57 Degrees   Right Shoulder Flexion 101 Degrees   Right Shoulder ABduction 72 Degrees   Left Shoulder Extension 60 Degrees   Left Shoulder Flexion 123 Degrees   Left Shoulder ABduction 91 Degrees   Cervical Flexion WNL   Cervical Extension WNL   Cervical - Right Side Bend WNL   Cervical - Left Side Bend WNL   Cervical - Right Rotation WNL   Cervical - Left Rotation WNL     Strength   Overall Strength Unable to assess;Due to precautions           LYMPHEDEMA/ONCOLOGY QUESTIONNAIRE - 03/11/17 0934      Type   Cancer Type s/p bil mastectomy     Surgeries   Mastectomy Date 01/28/17   Sentinel Lymph Node Biopsy Date 01/28/17   Axillary Lymph Node Dissection Date 02/14/17   Number Lymph Nodes Removed 15     Treatment   Active Chemotherapy Treatment Yes   Date 03/06/17   Past Chemotherapy Treatment No   Active Radiation Treatment No   Past Radiation Treatment No   Current Hormone Treatment No   Past Hormone Therapy Yes   Date 03/06/17   Drug Name Tamoxifen     What other symptoms do you have   Are you Having Heaviness or Tightness No   Are you having Pain Yes   Are you having pitting edema No   Is it Hard or Difficult finding clothes that fit No   Do you have infections No   Is there Decreased scar mobility Yes   [place]mer Sign No     Lymphedema Assessments   Lymphedema Assessments Upper extremities     Right Upper Extremity Lymphedema   10 cm Proximal to Olecranon Process 23.2 cm   Olecranon Process 21.7 cm   10  cm Proximal to Ulnar Styloid Process 19.5 cm   Just Proximal to Ulnar Styloid Process 12.8 cm   Across Hand at [employer] 16.8 cm   At [place] of 2nd Digit 5.5 cm     Left Upper Extremity Lymphedema   10 cm Proximal to Olecranon Process 23.8 cm   Olecranon Process 21.5 cm   10 cm Proximal to Ulnar Styloid Process 19.2 cm   Just Proximal to Ulnar Styloid Process 12.8 cm  Across Hand at [employer] 17.1 cm   At [place] of 2nd Digit 5.4 cm           Katina Dung - 03/11/17 0001    Open a tight or new jar Mild difficulty   Do heavy household chores (wash walls, wash floors) Mild difficulty   Carry a shopping bag or briefcase No difficulty   Wash your back Mild difficulty   Use a knife to cut food No difficulty   Recreational activities in which you take some force or impact through your arm, shoulder, or hand (golf, hammering, tennis) Mild difficulty   During the past week, to what extent has your arm, shoulder or hand problem interfered with your normal social activities with family, friends, neighbors, or groups? Slightly   During the past week, to what extent has your arm, shoulder or hand problem limited your work or other regular daily activities Slightly   Arm, shoulder, or hand pain. None   Tingling (pins and needles) in your arm, shoulder, or hand None   Difficulty Sleeping No difficulty   DASH Score 13.64 %      Objective measurements completed on examination: See above findings.          [facility] Adult PT Treatment/Exercise - 03/11/17 0001      Exercises   Exercises Shoulder     Shoulder Exercises: Pulleys   Flexion 2 minutes   Flexion Limitations After PT verbal cues for proper technique   ABduction 2 minutes     Shoulder Exercises: Therapy Ball   Flexion 5 reps   Flexion Limitations Verbal cues for technique   ABduction 5 reps   ABduction Limitations Verbal cues to work in painfree ROM                PT Education - 03/11/17 0949    Education  provided Yes   Education Details Supine shoulder cane exercises   Person(s) Educated Patient   Methods Explanation;Demonstration;Handout   Comprehension Returned demonstration;Verbalized understanding           [facility] Goals - 03/11/17 1026      CC Long Term Goal  #1   Title Patient will be independent in her home exercise program for improving shoulder ROM.   Time 4   Period Weeks   Status New     CC Long Term Goal  #2   Title Incease bilateral shoulder active flexion to >/= 130 degrees for increased ease reaching.   Time 4   Period Weeks   Status New     CC Long Term Goal  #3   Title Incease bilateral shoulder active abduction to >/= 130 degrees for increased ease reaching.   Time 4   Period Weeks   Status New     CC Long Term Goal  #4   Title Patient will report she has returned to running without increased edema in her right lateral trunk.   Time 4   Period Weeks   Status New     CC Long Term Goal  #5   Title Patient will verbalize understanding of lymphedema risk reduction practices.   Time 4   Period Weeks   Status New             Plan - 03/11/17 0955    Clinical Impression Statement Patient is a very pleasant woman s/p a bilateral mastectomy and right ALND with 15 nodes removed (12 were positive for cancer). She underwent the  inital surgery with a right sentinel node biopsy on 01/28/17 which found 4/4 positive nodes and had another surgery on 02/14/17 to re-excise additional right rbeast tissue and 11 more nodes, 8 of which were positive. She started chemotherapy last week on 03/06/17. She has significant tightness in bilateral shoulders and axillae. She will benefit from physical therapy to improve ROM and reduce lymphedema risk as she is at high risk.   History and Personal Factors relevant to plan of care: Currently undergoing chemotherapy.   Clinical Presentation Stable   Clinical Decision Making Low   Rehab Potential Excellent   Clinical  Impairments Affecting Rehab Potential None   PT Frequency 2x / week   PT Duration 4 weeks   PT Treatment/Interventions Patient/family education;Therapeutic exercise;ADLs/Self Care Home Management;Therapeutic activities;Manual techniques;Manual lymph drainage;Scar mobilization;Passive range of motion   PT Next Visit Plan Pulleys, ROM exercises, PROM bilateral shoulders to pt tolerance   PT Home Exercise Plan Supine cane exercises   Consulted and Agree with Plan of Care Patient      Patient will benefit from skilled therapeutic intervention in order to improve the following deficits and impairments:  Postural dysfunction, Decreased knowledge of precautions, Pain, Impaired UE functional use, Decreased range of motion  Visit Diagnosis: Carcinoma of lower-outer quadrant of right breast in female, estrogen receptor positive ([place]) - Plan: PT plan of care cert/re-cert  Abnormal posture - Plan: PT plan of care cert/re-cert  Stiffness of right shoulder, not elsewhere classified - Plan: PT plan of care cert/re-cert  Stiffness of left shoulder, not elsewhere classified - Plan: PT plan of care cert/re-cert  Aftercare following surgery for neoplasm - Plan: PT plan of care cert/re-cert     Problem List Patient Active Problem List   Diagnosis Date Noted  . Cancer of central portion of right female breast ([place]) 01/28/2017  . 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, estrogen receptor positive ([place]) 01/08/2017   Annia Friendly, PT 03/11/17 10:36 AM  [facility], [place], [address] Phone: [phone]   Fax:  [phone]  Name: Angelica Floyd MRN: [id number] Date of Birth: [birth year]

## 2017-03-11 NOTE — Patient Instructions (Signed)
Cane Exercise: External Rotation    Lie with elbows on surface, even with shoulders. Hold cane above chest, palms toward toes. Lower arms back as far as possible. Keep elbows on surface. Hold __2__ seconds. Repeat __10__ times. Do __2__ sessions per day.  http://gt2.exer.us/88   Copyright  VHI. All rights reserved.  Flexion (Eccentric) - Active (Cane)    Do this laying down! Lift cane with both hands. Avoid hiking shoulders. Lower cane slowly for 3-5 seconds. __10_ reps , hold 2 seconds and repeat twice a day. http://ecce.exer.us/155   Copyright  VHI. All rights reserved.  Cane Exercise: Abduction    Lay down. Hold cane with right hand over end, palm-up, with other hand palm-down. Move arm out from side and up by pushing with other arm. Hold __2__ seconds. Repeat __10__ times. Do __2__ sessions per day.  http://gt2.exer.us/82   Copyright  VHI. All rights reserved.

## 2017-03-13 ENCOUNTER — Other Ambulatory Visit (HOSPITAL_BASED_OUTPATIENT_CLINIC_OR_DEPARTMENT_OTHER): Payer: PRIVATE HEALTH INSURANCE

## 2017-03-13 ENCOUNTER — Ambulatory Visit (HOSPITAL_BASED_OUTPATIENT_CLINIC_OR_DEPARTMENT_OTHER): Payer: PRIVATE HEALTH INSURANCE | Admitting: Adult Health

## 2017-03-13 VITALS — BP 134/80 | HR 75 | Temp 98.0°F | Resp 18 | Ht 63.0 in | Wt 116.7 lb

## 2017-03-13 DIAGNOSIS — C50511 Malignant neoplasm of lower-outer quadrant of right female breast: Secondary | ICD-10-CM | POA: Diagnosis not present

## 2017-03-13 DIAGNOSIS — Z170 Estrogen receptor positive status [ER+]: Secondary | ICD-10-CM

## 2017-03-13 DIAGNOSIS — C773 Secondary and unspecified malignant neoplasm of axilla and upper limb lymph nodes: Secondary | ICD-10-CM | POA: Diagnosis not present

## 2017-03-13 DIAGNOSIS — M899 Disorder of bone, unspecified: Secondary | ICD-10-CM

## 2017-03-13 DIAGNOSIS — R911 Solitary pulmonary nodule: Secondary | ICD-10-CM | POA: Diagnosis not present

## 2017-03-13 DIAGNOSIS — N83209 Unspecified ovarian cyst, unspecified side: Secondary | ICD-10-CM

## 2017-03-13 DIAGNOSIS — K7689 Other specified diseases of liver: Secondary | ICD-10-CM

## 2017-03-13 LAB — COMPREHENSIVE METABOLIC PANEL
ALBUMIN: 3.8 g/dL (ref 3.5–5.0)
ALK PHOS: 74 U/L (ref 40–150)
ALT: 17 U/L (ref 0–55)
ANION GAP: 8 meq/L (ref 3–11)
AST: 19 U/L (ref 5–34)
BILIRUBIN TOTAL: 0.43 mg/dL (ref 0.20–1.20)
BUN: 10.5 mg/dL (ref 7.0–26.0)
CALCIUM: 9.3 mg/dL (ref 8.4–10.4)
CO2: 27 mEq/L (ref 22–29)
Chloride: 102 mEq/L (ref 98–109)
Creatinine: 0.8 mg/dL (ref 0.6–1.1)
GLUCOSE: 108 mg/dL (ref 70–140)
Potassium: 3.8 mEq/L (ref 3.5–5.1)
SODIUM: 137 meq/L (ref 136–145)
TOTAL PROTEIN: 6.8 g/dL (ref 6.4–8.3)

## 2017-03-13 LAB — CBC WITH DIFFERENTIAL/PLATELET
BASO%: 1.4 % (ref 0.0–2.0)
BASOS ABS: 0 10*3/uL (ref 0.0–0.1)
EOS ABS: 0.2 10*3/uL (ref 0.0–0.5)
EOS%: 6.8 % (ref 0.0–7.0)
HEMATOCRIT: 36.2 % (ref 34.8–46.6)
HEMOGLOBIN: 11.7 g/dL (ref 11.6–15.9)
LYMPH#: 0.9 10*3/uL (ref 0.9–3.3)
LYMPH%: 28.9 % (ref 14.0–49.7)
MCH: 29.6 pg (ref 25.1–34.0)
MCHC: 32.3 g/dL (ref 31.5–36.0)
MCV: 91.6 fL (ref 79.5–101.0)
MONO#: 0.4 10*3/uL (ref 0.1–0.9)
MONO%: 12.6 % (ref 0.0–14.0)
NEUT%: 50.3 % (ref 38.4–76.8)
NEUTROS ABS: 1.5 10*3/uL (ref 1.5–6.5)
PLATELETS: 96 10*3/uL — AB (ref 145–400)
RBC: 3.95 10*6/uL (ref 3.70–5.45)
RDW: 12.5 % (ref 11.2–14.5)
WBC: 2.9 10*3/uL — AB (ref 3.9–10.3)

## 2017-03-13 NOTE — Assessment & Plan Note (Addendum)
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 adjuvant chemotherapy with dose dense Adriamycin and Cytoxan x4 followed by Taxol weekly x12 3. Adjuvant radiation 4. Adjuvant antiestrogen therapy with tamoxifen (which was originally started prior to surgery) ---------------------------------------------------------------------- Current treatment: Cycle 1 day 8 dose dense Adriamycin Cytoxan  Flynn tolerated treatment well.  I reviewed her anti emetics with her again.  I reviewed her labs with her today and what they mean.  She is not neutropenic.  Thompson Caul, LPN did get her a cranial prosthesis prescription, and we went ahead and scheduled all of her appointments out through the end of January, 2019.  She will return in one week for labs, follow up, and cycle 2 of Adriamycin/Cytoxan.

## 2017-03-13 NOTE — Progress Notes (Signed)
[facility] Follow up:    Angelica Booze, NP [place] [address] [place] [address]   DIAGNOSIS: Cancer Staging 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, estrogen receptor positive ([place]) Staging form: Breast, AJCC 8th Edition - Clinical stage from 01/09/2017: Stage IB (cT2, cN0, cM0, G2, ER: Positive, PR: Positive, HER2: Negative) - Unsigned - Pathologic stage from 02/13/2017: Stage IB (pT2, pN2a, cM0, G1, ER: Positive, PR: Positive, HER2: Negative) - Signed by Gardenia Phlegm, NP on 02/13/2017   SUMMARY OF ONCOLOGIC HISTORY:   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, estrogen receptor positive ([place])   01/02/2017 Initial Diagnosis    Palpable right breast mass retroareolar 6:30 position: 3.6 cm size axilla negative, 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 HER-2 negative ratio 1.31 Ki-67 3% in addition calcifications UIQ 1.4 cm stereotactic biopsy flat epithelial atypia; clips are 4.3 cm apart, T2 N0 stage IB clinical stage AJCC 8      01/28/2017 Surgery    Bilateral mastectomies: Right: Grade 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%, PR 95%, HER-2 negative ratio 1.31, Ki-67 3%, 4/4 lymph nodes positive; left mastectomy: PASH and FC changes, no malignancy; T2 N2,  stage IIA AJCC 8      02/14/2017 Surgery    Right axillary lymph node dissection 8/11 lymph nodes positive      03/06/2017 -  Chemotherapy    Dose dense AC x4 followed by Taxol x12       CURRENT THERAPY: AC cycle 1 day 8  INTERVAL HISTORY: Angelica Floyd 51 y.o. female returns for evaluation after receiving her first cycle of AC. She underwent her treatment.  She took Dexamethasone the two following days and did note a jittery feeling.  She also experienced a headache last night and didn't take anything.    Patient Active Problem List   Diagnosis Date Noted  . Cancer of central portion of right female breast ([place]) 01/28/2017  .  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, estrogen receptor positive ([place]) 01/08/2017    has No Known Allergies.  MEDICAL HISTORY: Past Medical History:  Diagnosis Date  . Anxiety    since breast cancer  . 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, estrogen receptor positive ([place]) 01/08/2017    SURGICAL HISTORY: Past Surgical History:  Procedure Laterality Date  . ABDOMINAL HYSTERECTOMY    . ADENOIDECTOMY W/ MYRINGOTOMY    . APPENDECTOMY    . AXILLARY LYMPH NODE DISSECTION Right 02/14/2017   Procedure: RIGHT AXILLARY LYMPH NODE DISSECTION ERAS PATHWAY;  Surgeon: Jovita Kussmaul, MD;  Location: [facility];  Service: General;  Laterality: Right;  . CESAREAN SECTION    . MASTECTOMY W/ SENTINEL NODE BIOPSY Bilateral 01/28/2017   RIGHT BREAST BIOPSY  . MASTECTOMY W/ SENTINEL NODE BIOPSY Bilateral 01/28/2017   Procedure: BILATERAL MASTECTOMY WITH RIGHT SENTINEL LYMPH NODE BIOPSY;  Surgeon: Jovita Kussmaul, MD;  Location: [facility];  Service: General;  Laterality: Bilateral;  . PORTACATH PLACEMENT Left 02/14/2017   Procedure: INSERTION PORT-A-CATH;  Surgeon: Jovita Kussmaul, MD;  Location: [facility];  Service: General;  Laterality: Left;  . RE-EXCISION OF BREAST CANCER,SUPERIOR MARGINS N/A 02/14/2017   Procedure: RE-EXCISION INFERIOR FLAP;  Surgeon: Jovita Kussmaul, MD;  Location: [facility];  Service: General;  Laterality: N/A;  . WISDOM TOOTH EXTRACTION      SOCIAL HISTORY: Social History  Social History  . Marital status: Married    Spouse name: N/A  . Number of children: N/A  . Years of education: N/A   Occupational History  . Not on file.   Social History Main Topics  . Smoking status: Never Smoker  . Smokeless tobacco: Never Used  . Alcohol use 3.0 oz/week    5 Glasses of wine per week     Comment: 02/12/17- no alcohol since cancer diagnosis  . Drug use: No  . Sexual activity: Not on file   Other Topics Concern  . Not on file    Social History Narrative  . No narrative on file    FAMILY HISTORY: No family history on file.  Review of Systems  Constitutional: Negative for appetite change, chills and fatigue.  HENT:   Negative for hearing loss and lump/mass.   Eyes: Negative for eye problems and icterus.  Respiratory: Negative for chest tightness, cough and shortness of breath.   Cardiovascular: Negative for chest pain, leg swelling and palpitations.  Gastrointestinal: Negative for abdominal distention.  Endocrine: Negative for hot flashes.  Genitourinary: Negative for difficulty urinating.   Musculoskeletal: Negative for arthralgias.  Skin: Negative for itching and rash.  Neurological: Negative for dizziness, extremity weakness, headaches and numbness.  Hematological: Negative for adenopathy. Does not bruise/bleed easily.  Psychiatric/Behavioral: Negative for depression. The patient is not nervous/anxious.       PHYSICAL EXAMINATION  ECOG PERFORMANCE STATUS: 1 - Symptomatic but completely ambulatory  Vitals:   03/13/17 1005  BP: 134/80  Pulse: 75  Resp: 18  Temp: 98 F (36.7 C)  SpO2: 100%    Physical Exam  Constitutional: She is oriented to person, place, and time and well-developed, well-nourished, and in no distress.  HENT:  Head: Normocephalic and atraumatic.  Mouth/Throat: Oropharynx is clear and moist. No oropharyngeal exudate.  Eyes: Pupils are equal, round, and reactive to light. No scleral icterus.  Neck: Neck supple.  Cardiovascular: Normal rate, regular rhythm and normal heart sounds.   Pulmonary/Chest: Effort normal and breath sounds normal. No respiratory distress. She has no wheezes. She has no rales.  Abdominal: Soft. Bowel sounds are normal. She exhibits no distension. There is no tenderness.  Musculoskeletal: She exhibits no edema.  Lymphadenopathy:    She has no cervical adenopathy.  Neurological: She is alert and oriented to person, place, and time.  Skin: Skin is warm  and dry. No rash noted. No erythema.  Psychiatric: Mood and affect normal.    LABORATORY DATA:  CBC    Component Value Date/Time   WBC 2.9 (L) 03/13/2017 0936   WBC 6.2 02/14/2017 0747   RBC 3.95 03/13/2017 0936   RBC 4.39 02/14/2017 0747   HGB 11.7 03/13/2017 0936   HCT 36.2 03/13/2017 0936   PLT 96 (L) 03/13/2017 0936   MCV 91.6 03/13/2017 0936   MCH 29.6 03/13/2017 0936   MCH 29.8 02/14/2017 0747   MCHC 32.3 03/13/2017 0936   MCHC 32.9 02/14/2017 0747   RDW 12.5 03/13/2017 0936   LYMPHSABS 0.9 03/13/2017 0936   MONOABS 0.4 03/13/2017 0936   EOSABS 0.2 03/13/2017 0936   BASOSABS 0.0 03/13/2017 0936    CMP     Component Value Date/Time   NA 137 03/13/2017 0936   K 3.8 03/13/2017 0936   CL 105 02/14/2017 0747   CO2 27 03/13/2017 0936   GLUCOSE 108 03/13/2017 0936   BUN 10.5 03/13/2017 0936   CREATININE 0.8 03/13/2017 0936   CALCIUM 9.3  03/13/2017 0936   PROT 6.8 03/13/2017 0936   ALBUMIN 3.8 03/13/2017 0936   AST 19 03/13/2017 0936   ALT 17 03/13/2017 0936   ALKPHOS 74 03/13/2017 0936   BILITOT 0.43 03/13/2017 0936   GFRNONAA >60 02/14/2017 0747   GFRAA >60 02/14/2017 0747      ASSESSMENT and PLAN:   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, estrogen receptor positive ([place]) 01/28/2017: Bilateral mastectomies: Right: Grade 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%, PR 95%, HER-2 negative ratio 1.31, Ki-67 3%, 4/4 lymph nodes positive; left mastectomy: PASH and FC changes, no malignancy; T2 N2,  stage IIA AJCC 8 02-14-17: 8/10 lymph nodes positive  CT chest 02/13/2017: 3 mm right middle lobe nodule likely benign benign cysts in the liver, ovarian cysts few small sclerotic lesions in the bone likely bone [place] Bone scan 02/13/2017: No bone metastases  Treatment plan: 1. adjuvant chemotherapy with dose dense Adriamycin and Cytoxan x4 followed by Taxol weekly x12 3. Adjuvant radiation 4. Adjuvant antiestrogen therapy with tamoxifen (which was  originally started prior to surgery) ---------------------------------------------------------------------- Current treatment: Cycle 1 day 8 dose dense Adriamycin Cytoxan  Loys tolerated treatment well.  I reviewed her anti emetics with her again.  I reviewed her labs with her today and what they mean.  She is not neutropenic.  Thompson Caul, LPN did get her a cranial prosthesis prescription, and we went ahead and scheduled all of her appointments out through the end of January, 2019.  She will return in one week for labs, follow up, and cycle 2 of Adriamycin/Cytoxan.      All questions were answered. The patient knows to call the clinic with any problems, questions or concerns. We can certainly see the patient much sooner if necessary.  A total of (30) minutes of face-to-face time was spent with this patient with greater than 50% of that time in counseling and care-coordination.  This note was electronically signed. Scot Dock, NP 03/14/2017

## 2017-03-14 ENCOUNTER — Ambulatory Visit: Payer: PRIVATE HEALTH INSURANCE | Attending: General Surgery

## 2017-03-14 ENCOUNTER — Other Ambulatory Visit: Payer: PRIVATE HEALTH INSURANCE

## 2017-03-14 ENCOUNTER — Encounter: Payer: Self-pay | Admitting: Adult Health

## 2017-03-14 DIAGNOSIS — M25612 Stiffness of left shoulder, not elsewhere classified: Secondary | ICD-10-CM | POA: Diagnosis present

## 2017-03-14 DIAGNOSIS — C50511 Malignant neoplasm of lower-outer quadrant of right female breast: Secondary | ICD-10-CM | POA: Insufficient documentation

## 2017-03-14 DIAGNOSIS — M25611 Stiffness of right shoulder, not elsewhere classified: Secondary | ICD-10-CM | POA: Insufficient documentation

## 2017-03-14 DIAGNOSIS — Z483 Aftercare following surgery for neoplasm: Secondary | ICD-10-CM | POA: Insufficient documentation

## 2017-03-14 DIAGNOSIS — Z170 Estrogen receptor positive status [ER+]: Secondary | ICD-10-CM | POA: Insufficient documentation

## 2017-03-14 DIAGNOSIS — R293 Abnormal posture: Secondary | ICD-10-CM

## 2017-03-14 NOTE — Therapy (Signed)
[place], [place], [address] Phone: [phone]   Fax:  [phone]  Physical Therapy Treatment  Patient Details  Name: Angelica Floyd MRN: [id number] Date of Birth: 1966-01-28 Referring Provider: Dr. Nicholas Lose  Encounter Date: 03/14/2017      PT End of Session - 03/14/17 1104    Visit Number 2   Number of Visits 8   Date for PT Re-Evaluation 04/08/17   PT Start Time 1019   PT Stop Time 1104   PT Time Calculation (min) 45 min   Activity Tolerance Patient tolerated treatment well   Behavior During Therapy [facility] for tasks assessed/performed      Past Medical History:  Diagnosis Date  . Anxiety    since breast cancer  . 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, estrogen receptor positive ([place]) 01/08/2017    Past Surgical History:  Procedure Laterality Date  . ABDOMINAL HYSTERECTOMY    . ADENOIDECTOMY W/ MYRINGOTOMY    . APPENDECTOMY    . AXILLARY LYMPH NODE DISSECTION Right 02/14/2017   Procedure: RIGHT AXILLARY LYMPH NODE DISSECTION ERAS PATHWAY;  Surgeon: Jovita Kussmaul, MD;  Location: [facility];  Service: General;  Laterality: Right;  . CESAREAN SECTION    . MASTECTOMY W/ SENTINEL NODE BIOPSY Bilateral 01/28/2017   RIGHT BREAST BIOPSY  . MASTECTOMY W/ SENTINEL NODE BIOPSY Bilateral 01/28/2017   Procedure: BILATERAL MASTECTOMY WITH RIGHT SENTINEL LYMPH NODE BIOPSY;  Surgeon: Jovita Kussmaul, MD;  Location: [facility];  Service: General;  Laterality: Bilateral;  . PORTACATH PLACEMENT Left 02/14/2017   Procedure: INSERTION PORT-A-CATH;  Surgeon: Jovita Kussmaul, MD;  Location: [facility];  Service: General;  Laterality: Left;  . RE-EXCISION OF BREAST CANCER,SUPERIOR MARGINS N/A 02/14/2017   Procedure: RE-EXCISION INFERIOR FLAP;  Surgeon: Jovita Kussmaul, MD;  Location: [facility];  Service: General;  Laterality: N/A;  . WISDOM TOOTH EXTRACTION      There were no vitals filed for this  visit.      Subjective Assessment - 03/14/17 1021    Subjective Just feeling my regular tightness at my chest wall. Felt fine after last surgery.    Pertinent History Bilateral mastectomy for right hormone receptive breast cancer with right ALNB 01/28/17 followed by re-excision and ALND 02/14/17. Undergoing chemotherapy.   Patient Stated Goals Improve arm function; prevent lymphedema   Currently in Pain? No/denies                         [facility] Adult PT Treatment/Exercise - 03/14/17 0001      Shoulder Exercises: Pulleys   Flexion 2 minutes   ABduction 2 minutes   ABduction Limitations Pt returned correct demo of these.      Shoulder Exercises: Therapy Ball   Flexion 10 reps  With gentle forward lean at end of stretch   ABduction 10 reps  Bil UE with side lean into end of stretch     Shoulder Exercises: ROM/Strengthening   "W" Arms Stand with back against wall 5 times, 5 sec holds     Manual Therapy   Manual Therapy Passive ROM   Passive ROM In Supine: Bil shoulders to pts tolerance into flexion, abduction and er                      Breast Clinic Goals - 01/09/17 1430      Patient will be able to verbalize  understanding of pertinent lymphedema risk reduction practices relevant to her diagnosis specifically related to skin care.   Time 1   Period Days   Status Achieved     Patient will be able to return demonstrate and/or verbalize understanding of the post-op home exercise program related to regaining shoulder range of motion.   Time 1   Period Days   Status Achieved     Patient will be able to verbalize understanding of the importance of attending the postoperative After Breast Cancer Class for further lymphedema risk reduction education and therapeutic exercise.   Time 1   Period Days   Status Achieved          Long Term Clinic Goals - 03/11/17 1026      CC Long Term Goal  #1   Title Patient will be independent in her home  exercise program for improving shoulder ROM.   Time 4   Period Weeks   Status New     CC Long Term Goal  #2   Title Incease bilateral shoulder active flexion to >/= 130 degrees for increased ease reaching.   Time 4   Period Weeks   Status New     CC Long Term Goal  #3   Title Incease bilateral shoulder active abduction to >/= 130 degrees for increased ease reaching.   Time 4   Period Weeks   Status New     CC Long Term Goal  #4   Title Patient will report she has returned to running without increased edema in her right lateral trunk.   Time 4   Period Weeks   Status New     CC Long Term Goal  #5   Title Patient will verbalize understanding of lymphedema risk reduction practices.   Time 4   Period Weeks   Status New            Plan - 03/14/17 1104    Clinical Impression Statement Pt tolerated first session of AA/ROM and P/ROM stretching very well reporting feeling looser after session. Encouraged her to continue supine dowel exercises for now and really focus on relaxing tense muscles throughout day (upper traps and pects).    Rehab Potential Excellent   Clinical Impairments Affecting Rehab Potential None   PT Frequency 2x / week   PT Duration 4 weeks   PT Treatment/Interventions Patient/family education;Therapeutic exercise;ADLs/Self Care Home Management;Therapeutic activities;Manual techniques;Manual lymph drainage;Scar mobilization;Passive range of motion   PT Next Visit Plan Cont pulleys, ROM exercises, PROM bilateral shoulders to pt tolerance   Consulted and Agree with Plan of Care Patient      Patient will benefit from skilled therapeutic intervention in order to improve the following deficits and impairments:  Postural dysfunction, Decreased knowledge of precautions, Pain, Impaired UE functional use, Decreased range of motion  Visit Diagnosis: Carcinoma of lower-outer quadrant of right breast in female, estrogen receptor positive ([facility])  Abnormal  posture  Stiffness of right shoulder, not elsewhere classified  Stiffness of left shoulder, not elsewhere classified  Aftercare following surgery for neoplasm     Problem List Patient Active Problem List   Diagnosis Date Noted  . Cancer of central portion of right female breast ([place]) 01/28/2017  . 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, estrogen receptor positive ([place]) 01/08/2017    Otelia Limes, PTA 03/14/2017, 11:06 AM  [place], [place], [address] Phone: [phone]   Fax:  [phone]  Name: KAYSEE HERGERT MRN: [id number] Date of Birth: 09/04/1965

## 2017-03-19 ENCOUNTER — Encounter: Payer: Self-pay | Admitting: Hematology and Oncology

## 2017-03-19 ENCOUNTER — Ambulatory Visit: Payer: PRIVATE HEALTH INSURANCE

## 2017-03-19 DIAGNOSIS — M25612 Stiffness of left shoulder, not elsewhere classified: Secondary | ICD-10-CM

## 2017-03-19 DIAGNOSIS — C50511 Malignant neoplasm of lower-outer quadrant of right female breast: Secondary | ICD-10-CM | POA: Diagnosis not present

## 2017-03-19 DIAGNOSIS — Z170 Estrogen receptor positive status [ER+]: Principal | ICD-10-CM

## 2017-03-19 DIAGNOSIS — M25611 Stiffness of right shoulder, not elsewhere classified: Secondary | ICD-10-CM

## 2017-03-19 DIAGNOSIS — R293 Abnormal posture: Secondary | ICD-10-CM

## 2017-03-19 DIAGNOSIS — Z483 Aftercare following surgery for neoplasm: Secondary | ICD-10-CM

## 2017-03-19 NOTE — Therapy (Signed)
[place], [place], [address] Phone: [phone]   Fax:  [phone]  Physical Therapy Treatment  Patient Details  Name: Angelica Floyd MRN: [id number] Date of Birth: [birth year] Referring Provider: Dr. Nicholas Lose   Encounter Date: 03/19/2017  PT End of Session - 03/19/17 1021    Visit Number  3    Number of Visits  8    Date for PT Re-Evaluation  04/08/17    PT Start Time  0941    PT Stop Time  1021    PT Time Calculation (min)  40 min    Activity Tolerance  Patient tolerated treatment well    Behavior During Therapy  [facility] for tasks assessed/performed       Past Medical History:  Diagnosis Date  . Anxiety    since breast cancer  . 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, estrogen receptor positive ([place]) 01/08/2017    Past Surgical History:  Procedure Laterality Date  . ABDOMINAL HYSTERECTOMY    . ADENOIDECTOMY W/ MYRINGOTOMY    . APPENDECTOMY    . CESAREAN SECTION    . MASTECTOMY W/ SENTINEL NODE BIOPSY Bilateral 01/28/2017   RIGHT BREAST BIOPSY  . WISDOM TOOTH EXTRACTION      There were no vitals filed for this visit.  Subjective Assessment - 03/19/17 0944    Subjective  I felt good after last visit and I've been doing the exercises at home and my tightness has definitely improved.     Pertinent History  Bilateral mastectomy for right hormone receptive breast cancer with right ALNB 01/28/17 followed by re-excision and ALND 02/14/17. Undergoing chemotherapy.    Patient Stated Goals  Improve arm function; prevent lymphedema    Currently in Pain?  No/denies         [facility] PT Assessment - 03/19/17 0001      AROM   Right Shoulder Flexion  112 Degrees    Right Shoulder ABduction  98 Degrees    Left Shoulder Flexion  127 Degrees    Left Shoulder ABduction  120 Degrees                  [facility] Adult PT Treatment/Exercise - 03/19/17 0001      Shoulder Exercises: Pulleys   Flexion  2 minutes    ABduction  2 minutes      Shoulder Exercises: Therapy Ball   Flexion  10 reps With forward lean into end of stretch   With forward lean into end of stretch   ABduction  10 reps Bil UE 10 times each   Bil UE 10 times each     Shoulder Exercises: ROM/Strengthening   Other ROM/Strengthening Exercises  UE ranger on wall for bil flexion into horizontal abd 5 times each      Manual Therapy   Manual Therapy  Passive ROM    Passive ROM  In Supine: Bil shoulders to pts tolerance into flexion, abduction and er                   Breast Clinic Goals - 01/09/17 1430      Patient will be able to verbalize understanding of pertinent lymphedema risk reduction practices relevant to her diagnosis specifically related to skin care.   Time  1    Period  Days    Status  Achieved      Patient will be able to return demonstrate and/or verbalize understanding of  the post-op home exercise program related to regaining shoulder range of motion.   Time  1    Period  Days    Status  Achieved      Patient will be able to verbalize understanding of the importance of attending the postoperative After Breast Cancer Class for further lymphedema risk reduction education and therapeutic exercise.   Time  1    Period  Days    Status  Achieved       Long Term Clinic Goals - 03/11/17 1026      CC Long Term Goal  #1   Title  Patient will be independent in her home exercise program for improving shoulder ROM.    Time  4    Period  Weeks    Status  New      CC Long Term Goal  #2   Title  Incease bilateral shoulder active flexion to >/= 130 degrees for increased ease reaching.    Time  4    Period  Weeks    Status  New      CC Long Term Goal  #3   Title  Incease bilateral shoulder active abduction to >/= 130 degrees for increased ease reaching.    Time  4    Period  Weeks    Status  New      CC Long Term Goal  #4   Title  Patient will  report she has returned to running without increased edema in her right lateral trunk.    Time  4    Period  Weeks    Status  New      CC Long Term Goal  #5   Title  Patient will verbalize understanding of lymphedema risk reduction practices.    Time  4    Period  Weeks    Status  New         Plan - 03/19/17 1021    Clinical Impression Statement  Pt is doing well with dowel stretching at home and tolerating AA/and P/ROM well in therapy with less pain this week at end ROM's. Her A/ROM measurements have already improved well since last week and pt is pleased with this.     Rehab Potential  Excellent    Clinical Impairments Affecting Rehab Potential  None    PT Frequency  2x / week    PT Duration  4 weeks    PT Treatment/Interventions  Patient/family education;Therapeutic exercise;ADLs/Self Care Home Management;Therapeutic activities;Manual techniques;Manual lymph drainage;Scar mobilization;Passive range of motion    PT Next Visit Plan  Cont pulleys, ROM exercises, PROM bilateral shoulders to pt tolerance. Possibly try gentle myofascial release to chest wall next week (pt will be 5 1/2 weeks out from surgery).     PT Home Exercise Plan  Supine cane exercises    Consulted and Agree with Plan of Care  Patient       Patient will benefit from skilled therapeutic intervention in order to improve the following deficits and impairments:  Postural dysfunction, Decreased knowledge of precautions, Pain, Impaired UE functional use, Decreased range of motion  Visit Diagnosis: Carcinoma of lower-outer quadrant of right breast in female, estrogen receptor positive ([facility])  Abnormal posture  Stiffness of right shoulder, not elsewhere classified  Stiffness of left shoulder, not elsewhere classified  Aftercare following surgery for neoplasm     Problem List Patient Active Problem List   Diagnosis Date Noted  . Cancer of central portion of right female breast (  [place]) 01/28/2017  . Malignant  neoplasm of 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, estrogen receptor positive ([place]) 01/08/2017    Otelia Limes, PTA 03/19/2017, 10:24 AM  [place], [place], [address] Phone: [phone]   Fax:  [phone]  Name: Angelica Floyd MRN: [id number] Date of Birth: [birth year]

## 2017-03-19 NOTE — Progress Notes (Signed)
Called pt to introduce myself as her Arboriculturist and to discuss copay assistance.  Pt informed me she has met her deductible and her out of pocket for the year so her insurance should pay at 100%.  I will give her my card on 03/20/17 so she can contact me in the beginning of the year to research possible copay assistance at that time.  I also informed her of the [employer] and went over what it covers.  She wants to leave it for others in more need so she doesn't want to apply.

## 2017-03-20 ENCOUNTER — Ambulatory Visit: Payer: PRIVATE HEALTH INSURANCE

## 2017-03-20 ENCOUNTER — Ambulatory Visit (HOSPITAL_BASED_OUTPATIENT_CLINIC_OR_DEPARTMENT_OTHER): Payer: PRIVATE HEALTH INSURANCE

## 2017-03-20 ENCOUNTER — Encounter: Payer: Self-pay | Admitting: Adult Health

## 2017-03-20 ENCOUNTER — Ambulatory Visit (HOSPITAL_BASED_OUTPATIENT_CLINIC_OR_DEPARTMENT_OTHER): Payer: PRIVATE HEALTH INSURANCE | Admitting: Adult Health

## 2017-03-20 ENCOUNTER — Other Ambulatory Visit (HOSPITAL_BASED_OUTPATIENT_CLINIC_OR_DEPARTMENT_OTHER): Payer: PRIVATE HEALTH INSURANCE

## 2017-03-20 VITALS — BP 125/85 | HR 102 | Temp 98.1°F | Resp 18 | Ht 63.0 in | Wt 115.6 lb

## 2017-03-20 VITALS — HR 99

## 2017-03-20 DIAGNOSIS — Z170 Estrogen receptor positive status [ER+]: Secondary | ICD-10-CM | POA: Diagnosis not present

## 2017-03-20 DIAGNOSIS — R911 Solitary pulmonary nodule: Secondary | ICD-10-CM

## 2017-03-20 DIAGNOSIS — C773 Secondary and unspecified malignant neoplasm of axilla and upper limb lymph nodes: Secondary | ICD-10-CM | POA: Diagnosis not present

## 2017-03-20 DIAGNOSIS — C50511 Malignant neoplasm of lower-outer quadrant of right female breast: Secondary | ICD-10-CM

## 2017-03-20 DIAGNOSIS — Z5111 Encounter for antineoplastic chemotherapy: Secondary | ICD-10-CM

## 2017-03-20 DIAGNOSIS — Z95828 Presence of other vascular implants and grafts: Secondary | ICD-10-CM

## 2017-03-20 LAB — CBC WITH DIFFERENTIAL/PLATELET
BASO%: 0.4 % (ref 0.0–2.0)
BASOS ABS: 0 10*3/uL (ref 0.0–0.1)
EOS ABS: 0 10*3/uL (ref 0.0–0.5)
EOS%: 0.3 % (ref 0.0–7.0)
HCT: 37.9 % (ref 34.8–46.6)
HGB: 12.4 g/dL (ref 11.6–15.9)
LYMPH%: 15.9 % (ref 14.0–49.7)
MCH: 29.8 pg (ref 25.1–34.0)
MCHC: 32.7 g/dL (ref 31.5–36.0)
MCV: 91.1 fL (ref 79.5–101.0)
MONO#: 0.9 10*3/uL (ref 0.1–0.9)
MONO%: 9.8 % (ref 0.0–14.0)
NEUT#: 7 10*3/uL — ABNORMAL HIGH (ref 1.5–6.5)
NEUT%: 73.6 % (ref 38.4–76.8)
Platelets: 203 10*3/uL (ref 145–400)
RBC: 4.16 10*6/uL (ref 3.70–5.45)
RDW: 13.3 % (ref 11.2–14.5)
WBC: 9.6 10*3/uL (ref 3.9–10.3)
lymph#: 1.5 10*3/uL (ref 0.9–3.3)

## 2017-03-20 LAB — COMPREHENSIVE METABOLIC PANEL
ALT: 28 U/L (ref 0–55)
ANION GAP: 8 meq/L (ref 3–11)
AST: 25 U/L (ref 5–34)
Albumin: 4.1 g/dL (ref 3.5–5.0)
Alkaline Phosphatase: 75 U/L (ref 40–150)
BUN: 8.1 mg/dL (ref 7.0–26.0)
CALCIUM: 9.3 mg/dL (ref 8.4–10.4)
CHLORIDE: 105 meq/L (ref 98–109)
CO2: 25 meq/L (ref 22–29)
CREATININE: 0.8 mg/dL (ref 0.6–1.1)
EGFR: >60 mL/min/{1.73_m2} (ref >60)
Glucose: 103 mg/dl (ref 70–140)
Potassium: 4 mEq/L (ref 3.5–5.1)
Sodium: 138 mEq/L (ref 136–145)
TOTAL PROTEIN: 7.1 g/dL (ref 6.4–8.3)

## 2017-03-20 MED ORDER — SODIUM CHLORIDE 0.9% FLUSH
10.0000 mL | INTRAVENOUS | Status: DC | PRN
Start: 2017-03-20 — End: 2017-03-20
  Administered 2017-03-20: 10 mL via INTRAVENOUS
  Filled 2017-03-20: qty 10

## 2017-03-20 MED ORDER — HEPARIN SOD (PORK) LOCK FLUSH 100 UNIT/ML IV SOLN
500.0000 [IU] | Freq: Once | INTRAVENOUS | Status: AC | PRN
  Administered 2017-03-20: 500 [IU]
  Filled 2017-03-20: qty 5

## 2017-03-20 MED ORDER — DIPHENHYDRAMINE HCL 50 MG/ML IJ SOLN
INTRAMUSCULAR | Status: AC
  Filled 2017-03-20: qty 1

## 2017-03-20 MED ORDER — SODIUM CHLORIDE 0.9 % IV SOLN
Freq: Once | INTRAVENOUS | Status: AC
  Administered 2017-03-20: 14:00:00 via INTRAVENOUS
  Filled 2017-03-20: qty 5

## 2017-03-20 MED ORDER — PALONOSETRON HCL INJECTION 0.25 MG/5ML
INTRAVENOUS | Status: AC
Start: 2017-03-20 — End: ?
  Filled 2017-03-20: qty 5

## 2017-03-20 MED ORDER — DOXORUBICIN HCL CHEMO IV INJECTION 2 MG/ML
60.0000 mg/m2 | Freq: Once | INTRAVENOUS | Status: AC
  Administered 2017-03-20: 94 mg via INTRAVENOUS
  Filled 2017-03-20: qty 47

## 2017-03-20 MED ORDER — PEGFILGRASTIM 6 MG/0.6ML ~~LOC~~ PSKT
6.0000 mg | PREFILLED_SYRINGE | Freq: Once | SUBCUTANEOUS | Status: AC
  Administered 2017-03-20: 6 mg via SUBCUTANEOUS
  Filled 2017-03-20: qty 0.6

## 2017-03-20 MED ORDER — SODIUM CHLORIDE 0.9% FLUSH
10.0000 mL | INTRAVENOUS | Status: DC | PRN
  Administered 2017-03-20: 10 mL
  Filled 2017-03-20: qty 10

## 2017-03-20 MED ORDER — SODIUM CHLORIDE 0.9 % IV SOLN
600.0000 mg/m2 | Freq: Once | INTRAVENOUS | Status: AC
  Administered 2017-03-20: 940 mg via INTRAVENOUS
  Filled 2017-03-20: qty 47

## 2017-03-20 MED ORDER — PALONOSETRON HCL INJECTION 0.25 MG/5ML
0.2500 mg | Freq: Once | INTRAVENOUS | Status: AC
  Administered 2017-03-20: 0.25 mg via INTRAVENOUS

## 2017-03-20 MED ORDER — DIPHENHYDRAMINE HCL 50 MG/ML IJ SOLN
25.0000 mg | Freq: Once | INTRAMUSCULAR | Status: AC
  Administered 2017-03-20: 25 mg via INTRAVENOUS

## 2017-03-20 MED ORDER — SODIUM CHLORIDE 0.9 % IV SOLN
Freq: Once | INTRAVENOUS | Status: AC
  Administered 2017-03-20: 13:00:00 via INTRAVENOUS

## 2017-03-20 NOTE — Progress Notes (Addendum)
Angelica Floyd:    Jettie Booze, NP [place] [address] [place] [address]   DIAGNOSIS: Cancer Staging 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, estrogen receptor positive ([place]) Staging form: Breast, AJCC 8th Edition - Clinical stage from 01/09/2017: Stage IB (cT2, cN0, cM0, G2, ER: Positive, PR: Positive, HER2: Negative) - Unsigned - Pathologic stage from 02/13/2017: Stage IB (pT2, pN2a, cM0, G1, ER: Positive, PR: Positive, HER2: Negative) - Signed by Gardenia Phlegm, NP on 02/13/2017   SUMMARY OF ONCOLOGIC HISTORY:   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, estrogen receptor positive ([place])   01/02/2017 Initial Diagnosis    Palpable right breast mass retroareolar 6:30 position: 3.6 cm size axilla negative, 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 HER-2 negative ratio 1.31 Ki-67 3% in addition calcifications UIQ 1.4 cm stereotactic biopsy flat epithelial atypia; clips are 4.3 cm apart, T2 N0 stage IB clinical stage AJCC 8      01/28/2017 Surgery    Bilateral mastectomies: Right: Grade 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%, PR 95%, HER-2 negative ratio 1.31, Ki-67 3%, 4/4 lymph nodes positive; left mastectomy: PASH and FC changes, no malignancy; T2 N2,  stage IIA AJCC 8      02/14/2017 Surgery    Right axillary lymph node dissection 8/11 lymph nodes positive      03/06/2017 -  Chemotherapy    Dose dense AC x4 followed by Taxol x12       CURRENT THERAPY: Adriamycin Cytoxan  INTERVAL HISTORY: Angelica Floyd 51 y.o. female returns for evaluation prior to receiving her second cycle of Adriamycin/Cytoxan.  She is doing well today.  She does note that she is anxious.  She had some palpitations and felt like her throat was tight after receiving her first cycle of treatment.  She also felt jittery with the Dexamethasone following chemotherapy.  Patient Active Problem List   Diagnosis Date Noted   . Cancer of central portion of right female breast ([place]) 01/28/2017  . 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, estrogen receptor positive ([place]) 01/08/2017    has No Known Allergies.  MEDICAL HISTORY: Past Medical History:  Diagnosis Date  . Anxiety    since breast cancer  . 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, estrogen receptor positive ([place]) 01/08/2017    SURGICAL HISTORY: Past Surgical History:  Procedure Laterality Date  . ABDOMINAL HYSTERECTOMY    . ADENOIDECTOMY W/ MYRINGOTOMY    . APPENDECTOMY    . CESAREAN SECTION    . MASTECTOMY W/ SENTINEL NODE BIOPSY Bilateral 01/28/2017   RIGHT BREAST BIOPSY  . WISDOM TOOTH EXTRACTION      SOCIAL HISTORY: Social History   Socioeconomic History  . Marital status: Married    Spouse name: Not on file  . Number of children: Not on file  . Years of education: Not on file  . Highest education level: Not on file  Social Needs  . Financial resource strain: Not on file  . Food insecurity - worry: Not on file  . Food insecurity - inability: Not on file  . Transportation needs - medical: Not on file  . Transportation needs - non-medical: Not on file  Occupational History  . Not on file  Tobacco Use  . Smoking status: Never Smoker  . Smokeless tobacco: Never Used  Substance and Sexual Activity  . Alcohol use:  Yes    Alcohol/week: 3.0 oz    Types: 5 Glasses of wine per week    Comment: 02/12/17- no alcohol since cancer diagnosis  . Drug use: No  . Sexual activity: Not on file  Other Topics Concern  . Not on file  Social History Narrative  . Not on file    FAMILY HISTORY: Non contributory  Review of Systems  Constitutional: Negative for appetite change, chills, fatigue, fever and unexpected weight change.  HENT:   Negative for hearing loss and lump/mass.   Eyes: Negative for eye problems.  Respiratory: Negative for chest tightness, cough and shortness of  breath.   Cardiovascular: Negative for chest pain, leg swelling and palpitations.  Gastrointestinal: Negative for abdominal distention, abdominal pain, constipation, diarrhea, nausea, rectal pain and vomiting.  Endocrine: Negative for hot flashes.  Musculoskeletal: Negative for arthralgias.  Skin: Negative for itching and rash.  Neurological: Negative for dizziness, extremity weakness, headaches and numbness.  Hematological: Negative for adenopathy. Does not bruise/bleed easily.  Psychiatric/Behavioral: Negative for depression. The patient is not nervous/anxious.       PHYSICAL EXAMINATION  ECOG PERFORMANCE STATUS: 1 - Symptomatic but completely ambulatory  Vitals:   03/20/17 1043  BP: 125/85  Pulse: (!) 102  Resp: 18  Temp: 98.1 F (36.7 C)  SpO2: 100%    Physical Exam  Constitutional: She is oriented to person, place, and time and well-developed, well-nourished, and in no distress.  HENT:  Head: Normocephalic and atraumatic.  Mouth/Throat: Oropharynx is clear and moist. No oropharyngeal exudate.  Eyes: Pupils are equal, round, and reactive to light. No scleral icterus.  Neck: Neck supple.  Cardiovascular: Normal rate, regular rhythm and normal heart sounds.  Pulmonary/Chest: Effort normal and breath sounds normal. No respiratory distress. She has no wheezes.  Abdominal: Soft. Bowel sounds are normal. She exhibits no distension. There is no tenderness. There is no rebound and no guarding.  Musculoskeletal: She exhibits no edema.  Lymphadenopathy:    She has no cervical adenopathy.  Neurological: She is alert and oriented to person, place, and time.  Skin: Skin is warm and dry.  Psychiatric: Mood and affect normal.    LABORATORY DATA:  CBC    Component Value Date/Time   WBC 9.6 03/20/2017 1011   WBC 6.2 02/14/2017 0747   RBC 4.16 03/20/2017 1011   RBC 4.39 02/14/2017 0747   HGB 12.4 03/20/2017 1011   HCT 37.9 03/20/2017 1011   PLT 203 03/20/2017 1011   MCV 91.1  03/20/2017 1011   MCH 29.8 03/20/2017 1011   MCH 29.8 02/14/2017 0747   MCHC 32.7 03/20/2017 1011   MCHC 32.9 02/14/2017 0747   RDW 13.3 03/20/2017 1011   LYMPHSABS 1.5 03/20/2017 1011   MONOABS 0.9 03/20/2017 1011   EOSABS 0.0 03/20/2017 1011   BASOSABS 0.0 03/20/2017 1011    CMP     Component Value Date/Time   NA 138 03/20/2017 1011   K 4.0 03/20/2017 1011   CL 105 02/14/2017 0747   CO2 25 03/20/2017 1011   GLUCOSE 103 03/20/2017 1011   BUN 8.1 03/20/2017 1011   CREATININE 0.8 03/20/2017 1011   CALCIUM 9.3 03/20/2017 1011   PROT 7.1 03/20/2017 1011   ALBUMIN 4.1 03/20/2017 1011   AST 25 03/20/2017 1011   ALT 28 03/20/2017 1011   ALKPHOS 75 03/20/2017 1011   BILITOT <0.22 03/20/2017 1011   GFRNONAA >60 02/14/2017 0747   GFRAA >60 02/14/2017 0747       ASSESSMENT  and PLAN:   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, estrogen receptor positive ([place]) 01/28/2017: Bilateral mastectomies: Right: Grade 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%, PR 95%, HER-2 negative ratio 1.31, Ki-67 3%, 4/4 lymph nodes positive; left mastectomy: PASH and FC changes, no malignancy; T2 N2,  stage IIA AJCC 8 02-14-17: 8/10 lymph nodes positive  CT chest 02/13/2017: 3 mm right middle lobe nodule likely benign benign cysts in the liver, ovarian cysts few small sclerotic lesions in the bone likely bone islands Bone scan 02/13/2017: No bone metastases  Treatment plan: 1. adjuvant chemotherapy with dose dense Adriamycin and Cytoxan x4 followed by Taxol weekly x12 3. Adjuvant radiation 4. Adjuvant antiestrogen therapy with tamoxifen (which was originally started prior to surgery) ---------------------------------------------------------------------- Current treatment: Cycle 2 day 1 dose dense Adriamycin Cytoxan  Tamy is doing moderately well today.  I reviewed her labs with her in detail.  She will proceed with cycle 2 of chemotherapy today.  Dr. Lindi Adie came in and saw her today  as well.  She will forego the oral dexamethasone the two days following her treatment.  I also called over to infusion to get them to give the Adriamycin slower.    Shaneika will return the Monday after Thanksgiving for labs, follow Floyd, and cycle 3 of Adriamycin/Cytoxan.       All questions were answered. The patient knows to call the clinic with any problems, questions or concerns. We can certainly see the patient much sooner if necessary.  A total of (30) minutes of face-to-face time was spent with this patient with greater than 50% of that time in counseling and care-coordination.  This note was electronically signed. Scot Dock, NP 03/20/2017   Attending Note  I personally saw and examined Angelica Floyd. The plan of care was discussed with her. I agree with the assessment and plan as documented above. I discussed with her about stopping dexamethasone oral therapy because she appears to have a lot of anxiety and hyperactivity related to the steroids. We will also slow down the Adriamycin infusion. Signed Rulon Eisenmenger, MD

## 2017-03-20 NOTE — Assessment & Plan Note (Addendum)
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 adjuvant chemotherapy with dose dense Adriamycin and Cytoxan x4 followed by Taxol weekly x12 3. Adjuvant radiation 4. Adjuvant antiestrogen therapy with tamoxifen (which was originally started prior to surgery) ---------------------------------------------------------------------- Current treatment: Cycle 2 day 1 dose dense Adriamycin Cytoxan  Angelica Floyd is doing moderately well today.  I reviewed her labs with her in detail.  She will proceed with cycle 2 of chemotherapy today.  Dr. Lindi Adie came in and saw her today as well.  She will forego the oral dexamethasone the two days following her treatment.  I also called over to infusion to get them to give the Adriamycin slower.    Angelica Floyd will return the Monday after Thanksgiving for labs, follow up, and cycle 3 of Adriamycin/Cytoxan.

## 2017-03-20 NOTE — Patient Instructions (Signed)
Implanted Port Home Guide An implanted port is a type of central line that is placed under the skin. Central lines are used to provide IV access when treatment or nutrition needs to be given through a person's veins. Implanted ports are used for long-term IV access. An implanted port may be placed because:  You need IV medicine that would be irritating to the small veins in your hands or arms.  You need long-term IV medicines, such as antibiotics.  You need IV nutrition for a long period.  You need frequent blood draws for lab tests.  You need dialysis.  Implanted ports are usually placed in the chest area, but they can also be placed in the upper arm, the abdomen, or the leg. An implanted port has two main parts:  Reservoir. The reservoir is round and will appear as a small, raised area under your skin. The reservoir is the part where a needle is inserted to give medicines or draw blood.  Catheter. The catheter is a thin, flexible tube that extends from the reservoir. The catheter is placed into a large vein. Medicine that is inserted into the reservoir goes into the catheter and then into the vein.  How will I care for my incision site? Do not get the incision site wet. Bathe or shower as directed by your health care provider. How is my port accessed? Special steps must be taken to access the port:  Before the port is accessed, a numbing cream can be placed on the skin. This helps numb the skin over the port site.  Your health care provider uses a sterile technique to access the port. ? Your health care provider must put on a mask and sterile gloves. ? The skin over your port is cleaned carefully with an antiseptic and allowed to dry. ? The port is gently pinched between sterile gloves, and a needle is inserted into the port.  Only "non-coring" port needles should be used to access the port. Once the port is accessed, a blood return should be checked. This helps ensure that the port  is in the vein and is not clogged.  If your port needs to remain accessed for a constant infusion, a clear (transparent) bandage will be placed over the needle site. The bandage and needle will need to be changed every week, or as directed by your health care provider.  Keep the bandage covering the needle clean and dry. Do not get it wet. Follow your health care provider's instructions on how to take a shower or bath while the port is accessed.  If your port does not need to stay accessed, no bandage is needed over the port.  What is flushing? Flushing helps keep the port from getting clogged. Follow your health care provider's instructions on how and when to flush the port. Ports are usually flushed with saline solution or a medicine called heparin. The need for flushing will depend on how the port is used.  If the port is used for intermittent medicines or blood draws, the port will need to be flushed: ? After medicines have been given. ? After blood has been drawn. ? As part of routine maintenance.  If a constant infusion is running, the port may not need to be flushed.  How long will my port stay implanted? The port can stay in for as long as your health care provider thinks it is needed. When it is time for the port to come out, surgery will be   done to remove it. The procedure is similar to the one performed when the port was put in. When should I seek immediate medical care? When you have an implanted port, you should seek immediate medical care if:  You notice a bad smell coming from the incision site.  You have swelling, redness, or drainage at the incision site.  You have more swelling or pain at the port site or the surrounding area.  You have a fever that is not controlled with medicine.  This information is not intended to replace advice given to you by your health care provider. Make sure you discuss any questions you have with your health care provider. Document  Released: 04/30/2005 Document Revised: 10/06/2015 Document Reviewed: 01/05/2013 [employer] Interactive Patient Education  2017 [employer]  

## 2017-03-20 NOTE — Patient Instructions (Signed)
[facility] Discharge Instructions for Patients Receiving Chemotherapy  Today you received the following chemotherapy agents Cytoxan,Adriamycin  To help prevent nausea and vomiting after your treatment, we encourage you to take your nausea medication as directed  If you develop nausea and vomiting that is not controlled by your nausea medication, call the clinic.   BELOW ARE SYMPTOMS THAT SHOULD BE REPORTED IMMEDIATELY:  *FEVER GREATER THAN 100.5 F  *CHILLS WITH OR WITHOUT FEVER  NAUSEA AND VOMITING THAT IS NOT CONTROLLED WITH YOUR NAUSEA MEDICATION  *UNUSUAL SHORTNESS OF BREATH  *UNUSUAL BRUISING OR BLEEDING  TENDERNESS IN MOUTH AND THROAT WITH OR WITHOUT PRESENCE OF ULCERS  *URINARY PROBLEMS  *BOWEL PROBLEMS  UNUSUAL RASH Items with * indicate a potential emergency and should be followed up as soon as possible.  Feel free to call the clinic should you have any questions or concerns. The clinic phone number is [phone].  Please show the [facility] at check-in to the Emergency Department and triage nurse.

## 2017-03-20 NOTE — Progress Notes (Signed)
Blood return noted before, every 75mls and after adriamycin push.   Per Dr. Lindi Adie Adriamycin push to be given in over a longer period of time. Adriamycin push over 25 mins patient tolerated well.

## 2017-03-25 ENCOUNTER — Ambulatory Visit: Payer: PRIVATE HEALTH INSURANCE | Admitting: Physical Therapy

## 2017-03-25 ENCOUNTER — Encounter: Payer: Self-pay | Admitting: Physical Therapy

## 2017-03-25 DIAGNOSIS — C50511 Malignant neoplasm of lower-outer quadrant of right female breast: Secondary | ICD-10-CM | POA: Diagnosis not present

## 2017-03-25 DIAGNOSIS — M25611 Stiffness of right shoulder, not elsewhere classified: Secondary | ICD-10-CM

## 2017-03-25 DIAGNOSIS — M25612 Stiffness of left shoulder, not elsewhere classified: Secondary | ICD-10-CM

## 2017-03-25 DIAGNOSIS — R293 Abnormal posture: Secondary | ICD-10-CM

## 2017-03-25 NOTE — Therapy (Addendum)
[place], [place], [address] Phone: [phone]   Fax:  [phone]  Physical Therapy Treatment  Patient Details  Name: Angelica Floyd MRN: [id number] Date of Birth: [birth year] Referring Provider: Dr. Nicholas Lose   Encounter Date: 03/25/2017  PT End of Session - 03/25/17 1601    Visit Number  4    Number of Visits  8    Date for PT Re-Evaluation  04/08/17    PT Start Time  1518    PT Stop Time  1600    PT Time Calculation (min)  42 min    Activity Tolerance  Patient tolerated treatment well    Behavior During Therapy  [facility] for tasks assessed/performed       Past Medical History:  Diagnosis Date  . Anxiety    since breast cancer  . 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, estrogen receptor positive ([place]) 01/08/2017    Past Surgical History:  Procedure Laterality Date  . ABDOMINAL HYSTERECTOMY    . ADENOIDECTOMY W/ MYRINGOTOMY    . APPENDECTOMY    . CESAREAN SECTION    . MASTECTOMY W/ SENTINEL NODE BIOPSY Bilateral 01/28/2017   RIGHT BREAST BIOPSY  . WISDOM TOOTH EXTRACTION      There were no vitals filed for this visit.  Subjective Assessment - 03/25/17 1520    Subjective  I had chemo last Wed so Thurs and Fri I did not do alot. I feel like I have cording on my right side.     Pertinent History  Bilateral mastectomy for right hormone receptive breast cancer with right ALNB 01/28/17 followed by re-excision and ALND 02/14/17. Undergoing chemotherapy.    Patient Stated Goals  Improve arm function; prevent lymphedema    Currently in Pain?  No/denies                      [facility] Adult PT Treatment/Exercise - 03/25/17 0001      Shoulder Exercises: Pulleys   Flexion  2 minutes    ABduction  2 minutes      Shoulder Exercises: Therapy Ball   Flexion  10 reps With forward lean into end of stretch    ABduction  10 reps Bil UE 10 times each       Manual Therapy   Manual Therapy  Passive ROM;Myofascial release    Myofascial Release  very gently to cording in right axilla while performing PROM    Passive ROM  In Supine: Bil shoulders to pts tolerance into flexion, abduction and er                   Breast Clinic Goals - 01/09/17 1430      Patient will be able to verbalize understanding of pertinent lymphedema risk reduction practices relevant to her diagnosis specifically related to skin care.   Time  1    Period  Days    Status  Achieved      Patient will be able to return demonstrate and/or verbalize understanding of the post-op home exercise program related to regaining shoulder range of motion.   Time  1    Period  Days    Status  Achieved      Patient will be able to verbalize understanding of the importance of attending the postoperative After Breast Cancer Class for further lymphedema risk reduction education and therapeutic exercise.   Time  1    Period  Days    Status  Achieved       Long Term Clinic Goals - 03/11/17 1026      CC Long Term Goal  #1   Title  Patient will be independent in her home exercise program for improving shoulder ROM.    Time  4    Period  Weeks    Status  New      CC Long Term Goal  #2   Title  Incease bilateral shoulder active flexion to >/= 130 degrees for increased ease reaching.    Time  4    Period  Weeks    Status  New      CC Long Term Goal  #3   Title  Incease bilateral shoulder active abduction to >/= 130 degrees for increased ease reaching.    Time  4    Period  Weeks    Status  New      CC Long Term Goal  #4   Title  Patient will report she has returned to running without increased edema in her right lateral trunk.    Time  4    Period  Weeks    Status  New      CC Long Term Goal  #5   Title  Patient will verbalize understanding of lymphedema risk reduction practices.    Time  4    Period  Weeks    Status  New         Plan - 03/25/17 1601     Clinical Impression Statement  Continued with AAROM and PROM today. Pt now has cording in right axilla that she reports she began noticing over the weekend. Her right shoulder ROM is more limited than her left most likely due to the cording. Continued gentle PROM to bilateral shoulders with gentle myofascial to cording.     Rehab Potential  Excellent    Clinical Impairments Affecting Rehab Potential  None    PT Frequency  2x / week    PT Duration  4 weeks    PT Treatment/Interventions  Patient/family education;Therapeutic exercise;ADLs/Self Care Home Management;Therapeutic activities;Manual techniques;Manual lymph drainage;Scar mobilization;Passive range of motion    PT Next Visit Plan  Cont pulleys, ROM exercises, PROM bilateral shoulders to pt tolerance. Possibly try gentle myofascial release to chest wall next week (pt will be 5 1/2 weeks out from surgery).     PT Home Exercise Plan  Supine cane exercises    Consulted and Agree with Plan of Care  Patient       Patient will benefit from skilled therapeutic intervention in order to improve the following deficits and impairments:  Postural dysfunction, Decreased knowledge of precautions, Pain, Impaired UE functional use, Decreased range of motion  Visit Diagnosis: Abnormal posture  Stiffness of right shoulder, not elsewhere classified  Stiffness of left shoulder, not elsewhere classified     Problem List Patient Active Problem List   Diagnosis Date Noted  . Cancer of central portion of right female breast ([place]) 01/28/2017  . 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, estrogen receptor positive ([place]) 01/08/2017    Allyson Sabal [facility] 03/25/2017, 4:03 PM  [place], [place], [address] Phone: [phone]   Fax:  [phone]  Name: Angelica Floyd MRN: [id number] Date of Birth: 1965/11/01  Manus Gunning,  PT 03/25/17 4:04 PM  Manus Gunning, PT 03/25/17 4:04 PM

## 2017-03-27 ENCOUNTER — Ambulatory Visit: Payer: PRIVATE HEALTH INSURANCE

## 2017-03-27 DIAGNOSIS — C50511 Malignant neoplasm of lower-outer quadrant of right female breast: Secondary | ICD-10-CM | POA: Diagnosis not present

## 2017-03-27 DIAGNOSIS — R293 Abnormal posture: Secondary | ICD-10-CM

## 2017-03-27 DIAGNOSIS — M25611 Stiffness of right shoulder, not elsewhere classified: Secondary | ICD-10-CM

## 2017-03-27 DIAGNOSIS — M25612 Stiffness of left shoulder, not elsewhere classified: Secondary | ICD-10-CM

## 2017-03-27 DIAGNOSIS — Z483 Aftercare following surgery for neoplasm: Secondary | ICD-10-CM

## 2017-03-27 NOTE — Therapy (Signed)
[place], [place], [address] Phone: [phone]   Fax:  [phone]  Physical Therapy Treatment  Patient Details  Name: Angelica Floyd MRN: [id number] Date of Birth: 1965-12-13 Referring Provider: Dr. Nicholas Lose   Encounter Date: 03/27/2017  PT End of Session - 03/27/17 0855    Visit Number  5    Number of Visits  8    Date for PT Re-Evaluation  04/08/17    PT Start Time  4401    PT Stop Time  0931    PT Time Calculation (min)  44 min    Activity Tolerance  Patient tolerated treatment well    Behavior During Therapy  [facility] for tasks assessed/performed       Past Medical History:  Diagnosis Date  . Anxiety    since breast cancer  . 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, estrogen receptor positive ([place]) 01/08/2017    Past Surgical History:  Procedure Laterality Date  . ABDOMINAL HYSTERECTOMY    . ADENOIDECTOMY W/ MYRINGOTOMY    . APPENDECTOMY    . CESAREAN SECTION    . MASTECTOMY W/ SENTINEL NODE BIOPSY Bilateral 01/28/2017   RIGHT BREAST BIOPSY  . WISDOM TOOTH EXTRACTION      There were no vitals filed for this visit.  Subjective Assessment - 03/27/17 0850    Subjective  I was sore after last visit but not unreasonably so, she really worked the cord area. It's frustrating because I've lost some motion I had gained from it (cord).     Pertinent History  Bilateral mastectomy for right hormone receptive breast cancer with right ALNB 01/28/17 followed by re-excision and ALND 02/14/17. Undergoing chemotherapy.    Patient Stated Goals  Improve arm function; prevent lymphedema    Currently in Pain?  No/denies         [facility] PT Assessment - 03/27/17 0001      AROM   Right Shoulder Flexion  109 Degrees    Right Shoulder ABduction  99 Degrees    Left Shoulder Flexion  134 Degrees    Left Shoulder ABduction  124 Degrees                  [facility] Adult  PT Treatment/Exercise - 03/27/17 0001      Shoulder Exercises: Pulleys   Flexion  2 minutes    ABduction  2 minutes      Shoulder Exercises: Therapy Ball   Flexion  10 reps With forward lean into end of stretch    ABduction  10 reps Bil UE 10 times each      Manual Therapy   Manual Therapy  Passive ROM;Myofascial release    Myofascial Release  very gently to cording in right axilla while performing PROM; also gently to lateral chest/flank area for increased stretching with P/ROM     Passive ROM  In Supine: Bil shoulders to pts tolerance into flexion, abduction and er                  Long Term Clinic Goals - 03/11/17 1026      CC Long Term Goal  #1   Title  Patient will be independent in her home exercise program for improving shoulder ROM.    Time  4    Period  Weeks    Status  New      CC Long Term Goal  #2   Title  Incease  bilateral shoulder active flexion to >/= 130 degrees for increased ease reaching.    Time  4    Period  Weeks    Status  New      CC Long Term Goal  #3   Title  Incease bilateral shoulder active abduction to >/= 130 degrees for increased ease reaching.    Time  4    Period  Weeks    Status  New      CC Long Term Goal  #4   Title  Patient will report she has returned to running without increased edema in her right lateral trunk.    Time  4    Period  Weeks    Status  New      CC Long Term Goal  #5   Title  Patient will verbalize understanding of lymphedema risk reduction practices.    Time  4    Period  Weeks    Status  New         Plan - 03/27/17 8185    Clinical Impression Statement  Pt tolerated AA/P/ROM well though does still report feeling very limitied and tight here. Her Rt shoulder ROM continues to be limited from what it was before cording. Also began gentle myofascial release today which pt tolerated well. Pts A/ROM on Lt side has improved, Rt side essentially unchanged due to cording beginning over weekend and now pt  feeling tighter here, thogh she does report this feels improved since Monday with stretching this week at therapy.     Rehab Potential  Excellent    Clinical Impairments Affecting Rehab Potential  None    PT Frequency  2x / week    PT Duration  4 weeks    PT Treatment/Interventions  Patient/family education;Therapeutic exercise;ADLs/Self Care Home Management;Therapeutic activities;Manual techniques;Manual lymph drainage;Scar mobilization;Passive range of motion    PT Next Visit Plan  Cont pulleys, ROM exercises, PROM bilateral shoulders to pt tolerance. Possibly try gentle myofascial release to chest wall next week (pt will be 5 1/2 weeks out from surgery).     Consulted and Agree with Plan of Care  Patient       Patient will benefit from skilled therapeutic intervention in order to improve the following deficits and impairments:  Postural dysfunction, Decreased knowledge of precautions, Pain, Impaired UE functional use, Decreased range of motion  Visit Diagnosis: Abnormal posture  Stiffness of right shoulder, not elsewhere classified  Stiffness of left shoulder, not elsewhere classified  Aftercare following surgery for neoplasm     Problem List Patient Active Problem List   Diagnosis Date Noted  . Cancer of central portion of right female breast ([place]) 01/28/2017  . 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, estrogen receptor positive ([place]) 01/08/2017    Otelia Limes, PTA 03/27/2017, 9:36 AM  [place], [place], [address] Phone: [phone]   Fax:  [phone]  Name: Angelica Floyd MRN: [id number] Date of Birth: 02-Oct-1965

## 2017-04-01 ENCOUNTER — Telehealth: Payer: Self-pay

## 2017-04-01 NOTE — Telephone Encounter (Signed)
Left VM for pt regarding her concerns of arm pain over the weekend. Pt had spoke with our on call RN. Informed her if the pain has not subsided we could get her into the lymphedema clinic. Advised to return call.  Cyndia Bent RN

## 2017-04-02 ENCOUNTER — Ambulatory Visit: Payer: PRIVATE HEALTH INSURANCE

## 2017-04-02 ENCOUNTER — Telehealth: Payer: Self-pay

## 2017-04-02 DIAGNOSIS — M25612 Stiffness of left shoulder, not elsewhere classified: Secondary | ICD-10-CM

## 2017-04-02 DIAGNOSIS — R293 Abnormal posture: Secondary | ICD-10-CM

## 2017-04-02 DIAGNOSIS — M25611 Stiffness of right shoulder, not elsewhere classified: Secondary | ICD-10-CM

## 2017-04-02 DIAGNOSIS — C50511 Malignant neoplasm of lower-outer quadrant of right female breast: Secondary | ICD-10-CM | POA: Diagnosis not present

## 2017-04-02 DIAGNOSIS — Z483 Aftercare following surgery for neoplasm: Secondary | ICD-10-CM

## 2017-04-02 NOTE — Therapy (Signed)
[place], [place], [address] Phone: [phone]   Fax:  [phone]  Physical Therapy Treatment  Patient Details  Name: Angelica Floyd MRN: [id number] Date of Birth: 1966-03-31 Referring Provider: Dr. Nicholas Lose   Encounter Date: 04/02/2017  PT End of Session - 04/02/17 1149    Visit Number  6    Number of Visits  8    Date for PT Re-Evaluation  04/08/17    PT Start Time  1110    PT Stop Time  1154    PT Time Calculation (min)  44 min    Activity Tolerance  Patient tolerated treatment well    Behavior During Therapy  [facility] for tasks assessed/performed       Past Medical History:  Diagnosis Date  . Anxiety    since breast cancer  . 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, estrogen receptor positive ([place]) 01/08/2017    Past Surgical History:  Procedure Laterality Date  . ABDOMINAL HYSTERECTOMY    . ADENOIDECTOMY W/ MYRINGOTOMY    . APPENDECTOMY    . AXILLARY LYMPH NODE DISSECTION Right 02/14/2017   Procedure: RIGHT AXILLARY LYMPH NODE DISSECTION ERAS PATHWAY;  Surgeon: Jovita Kussmaul, MD;  Location: [place];  Service: General;  Laterality: Right;  . CESAREAN SECTION    . MASTECTOMY W/ SENTINEL NODE BIOPSY Bilateral 01/28/2017   RIGHT BREAST BIOPSY  . MASTECTOMY W/ SENTINEL NODE BIOPSY Bilateral 01/28/2017   Procedure: BILATERAL MASTECTOMY WITH RIGHT SENTINEL LYMPH NODE BIOPSY;  Surgeon: Jovita Kussmaul, MD;  Location: [place];  Service: General;  Laterality: Bilateral;  . PORTACATH PLACEMENT Left 02/14/2017   Procedure: INSERTION PORT-A-CATH;  Surgeon: Jovita Kussmaul, MD;  Location: [place];  Service: General;  Laterality: Left;  . RE-EXCISION OF BREAST CANCER,SUPERIOR MARGINS N/A 02/14/2017   Procedure: RE-EXCISION INFERIOR FLAP;  Surgeon: Jovita Kussmaul, MD;  Location: [place];  Service: General;  Laterality: N/A;  . WISDOM TOOTH EXTRACTION      There were no vitals  filed for this visit.  Subjective Assessment - 04/02/17 1117    Subjective  My port was bothering me, feeling achy, but I just spoke with Dr. Landis Gandy office and they said it's just "settling" and that what I'm feeling is normal. My cord is still there butI think it's stretching out, it feels less limiting.      Pertinent History  Bilateral mastectomy for right hormone receptive breast cancer with right ALNB 01/28/17 followed by re-excision and ALND 02/14/17. Undergoing chemotherapy.    Patient Stated Goals  Improve arm function; prevent lymphedema    Currently in Pain?  No/denies         [facility] PT Assessment - 04/02/17 0001      AROM   Right Shoulder Flexion  110 Degrees    Right Shoulder ABduction  103 Degrees    Left Shoulder Flexion  133 Degrees    Left Shoulder ABduction  134 Degrees                  [facility] Adult PT Treatment/Exercise - 04/02/17 0001      Shoulder Exercises: Pulleys   Flexion  2 minutes    ABduction  2 minutes      Manual Therapy   Manual Therapy  Passive ROM;Myofascial release;Other (comment)    Myofascial Release  very gently to cording in right axilla while performing PROM; also gently to lateral chest/flank area for  increased stretching with P/ROM     Passive ROM  In Supine: Bil shoulders to pts tolerance into flexion, abduction and er    Other Manual Therapy  Issued TG soft for pt to wear over holiday weekend to see if this will help alleviate cording at all. Pt getting measured for compression sleeve today but was told it would have to be custom as her wrist is so small.                    Breast Clinic Goals - 01/09/17 1430      Patient will be able to verbalize understanding of pertinent lymphedema risk reduction practices relevant to her diagnosis specifically related to skin care.   Time  1    Period  Days    Status  Achieved      Patient will be able to return demonstrate and/or verbalize understanding of the post-op home  exercise program related to regaining shoulder range of motion.   Time  1    Period  Days    Status  Achieved      Patient will be able to verbalize understanding of the importance of attending the postoperative After Breast Cancer Class for further lymphedema risk reduction education and therapeutic exercise.   Time  1    Period  Days    Status  Achieved       Long Term Clinic Goals - 04/02/17 1201      CC Long Term Goal  #1   Title  Patient will be independent in her home exercise program for improving shoulder ROM.    Status  On-going      CC Long Term Goal  #2   Title  Incease bilateral shoulder active flexion to >/= 130 degrees for increased ease reaching.    Baseline  Rt 110 degrees (cording very limiting) and Lt 133 degrees=04/02/17    Status  Partially Met      CC Long Term Goal  #3   Title  Incease bilateral shoulder active abduction to >/= 130 degrees for increased ease reaching.    Baseline  Rt 103 degrees (cording very limiting) and Lt 134 degrees-04/02/17    Status  Partially Met      CC Long Term Goal  #4   Title  Patient will report she has returned to running without increased edema in her right lateral trunk.    Baseline  Pt has yet to try this but will this weekend as she is signed up for a run (plans to walk mostly though)-04/02/17    Status  On-going      CC Long Term Goal  #5   Title  Patient will verbalize understanding of lymphedema risk reduction practices.    Status  On-going         Plan - 04/02/17 1150    Clinical Impression Statement  Pt continues to tolerate P/ROM well though cording is still very prominent at her Rt axilla. Pt and therapist did feel and hear a "pop" at her mid bicep today and her ROM after that, thogh still limited, was less painful. Pt also reported it feeling better/looser at end of session today. Her Rt shoulder A/ROM remains mostly unchanged (increased a few degrees with abd) but her Lt shoulder abd increased 10 degrees. Pt  reports therapy feeling very beneficial with helpin decrease the tightness she has been experiencing, especially with the cording. Issued TG soft sleeve to pt as she reports she  was told they would have to special order her sleeve as her arm is so small, but getting measured for sleeve.    Rehab Potential  Excellent    Clinical Impairments Affecting Rehab Potential  None    PT Frequency  2x / week    PT Duration  4 weeks    PT Treatment/Interventions  Patient/family education;Therapeutic exercise;ADLs/Self Care Home Management;Therapeutic activities;Manual techniques;Manual lymph drainage;Scar mobilization;Passive range of motion    PT Next Visit Plan  Renewal next. See how pt tolerated walk/run in race over holiday. See if got compression sleeve. Cont pulleys, ROM exercises, PROM bilateral shoulders to pt tolerance. Add gentle myofascial release to chest wall next week and cont this at Rt axilla on cording; can also try supine scapular series but need to assess limitations of cording with this.    Recommended Other Services  PT received script for sleeve from doctor and is getting measured today for compression sleeve.     Consulted and Agree with Plan of Care  Patient       Patient will benefit from skilled therapeutic intervention in order to improve the following deficits and impairments:  Postural dysfunction, Decreased knowledge of precautions, Pain, Impaired UE functional use, Decreased range of motion  Visit Diagnosis: Abnormal posture  Stiffness of right shoulder, not elsewhere classified  Stiffness of left shoulder, not elsewhere classified  Aftercare following surgery for neoplasm     Problem List Patient Active Problem List   Diagnosis Date Noted  . Cancer of central portion of right female breast ([place]) 01/28/2017  . 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, estrogen receptor positive ([place]) 01/08/2017    Otelia Limes,  PTA 04/02/2017, 12:09 PM  [place], [place], [address] Phone: [phone]   Fax:  [phone]  Name: Angelica Floyd MRN: [id number] Date of Birth: [birth year]

## 2017-04-02 NOTE — Telephone Encounter (Signed)
Returned pts call regarding my follow up call on her issue she had over the weekend with the triage RN. Her right arm heaviness has resolved. She expressed concern regarding new soreness around her port. It was placed on October 4th. I assured her this is normal and most likely due to the port settling into the tissue and the tissue may be slightly irritated. No other concerns at this time.  Cyndia Bent RN

## 2017-04-08 ENCOUNTER — Ambulatory Visit (HOSPITAL_BASED_OUTPATIENT_CLINIC_OR_DEPARTMENT_OTHER): Payer: PRIVATE HEALTH INSURANCE | Admitting: Adult Health

## 2017-04-08 ENCOUNTER — Other Ambulatory Visit (HOSPITAL_BASED_OUTPATIENT_CLINIC_OR_DEPARTMENT_OTHER): Payer: PRIVATE HEALTH INSURANCE

## 2017-04-08 ENCOUNTER — Ambulatory Visit: Payer: PRIVATE HEALTH INSURANCE

## 2017-04-08 ENCOUNTER — Encounter: Payer: Self-pay | Admitting: Adult Health

## 2017-04-08 ENCOUNTER — Ambulatory Visit (HOSPITAL_BASED_OUTPATIENT_CLINIC_OR_DEPARTMENT_OTHER): Payer: PRIVATE HEALTH INSURANCE

## 2017-04-08 ENCOUNTER — Encounter: Payer: PRIVATE HEALTH INSURANCE | Admitting: Physical Therapy

## 2017-04-08 VITALS — BP 147/89 | HR 85 | Temp 98.0°F | Resp 18 | Ht 63.0 in | Wt 117.0 lb

## 2017-04-08 DIAGNOSIS — C773 Secondary and unspecified malignant neoplasm of axilla and upper limb lymph nodes: Secondary | ICD-10-CM | POA: Diagnosis not present

## 2017-04-08 DIAGNOSIS — Z5111 Encounter for antineoplastic chemotherapy: Secondary | ICD-10-CM | POA: Diagnosis not present

## 2017-04-08 DIAGNOSIS — Z170 Estrogen receptor positive status [ER+]: Secondary | ICD-10-CM | POA: Diagnosis not present

## 2017-04-08 DIAGNOSIS — R911 Solitary pulmonary nodule: Secondary | ICD-10-CM

## 2017-04-08 DIAGNOSIS — C50511 Malignant neoplasm of lower-outer quadrant of right female breast: Secondary | ICD-10-CM

## 2017-04-08 LAB — CBC WITH DIFFERENTIAL/PLATELET
BASO%: 0.5 % (ref 0.0–2.0)
BASOS ABS: 0 10*3/uL (ref 0.0–0.1)
EOS%: 0 % (ref 0.0–7.0)
Eosinophils Absolute: 0 10*3/uL (ref 0.0–0.5)
HCT: 36 % (ref 34.8–46.6)
HGB: 11.8 g/dL (ref 11.6–15.9)
LYMPH%: 10.9 % — ABNORMAL LOW (ref 14.0–49.7)
MCH: 29.9 pg (ref 25.1–34.0)
MCHC: 32.8 g/dL (ref 31.5–36.0)
MCV: 91.1 fL (ref 79.5–101.0)
MONO#: 1.1 10*3/uL — ABNORMAL HIGH (ref 0.1–0.9)
MONO%: 12.5 % (ref 0.0–14.0)
NEUT#: 6.8 10*3/uL — ABNORMAL HIGH (ref 1.5–6.5)
NEUT%: 76.1 % (ref 38.4–76.8)
Platelets: 247 10*3/uL (ref 145–400)
RBC: 3.95 10*6/uL (ref 3.70–5.45)
RDW: 14.5 % (ref 11.2–14.5)
WBC: 8.9 10*3/uL (ref 3.9–10.3)
lymph#: 1 10*3/uL (ref 0.9–3.3)

## 2017-04-08 LAB — COMPREHENSIVE METABOLIC PANEL
ALBUMIN: 4.1 g/dL (ref 3.5–5.0)
ALK PHOS: 75 U/L (ref 40–150)
ALT: 36 U/L (ref 0–55)
AST: 32 U/L (ref 5–34)
Anion Gap: 8 mEq/L (ref 3–11)
BUN: 10.1 mg/dL (ref 7.0–26.0)
CHLORIDE: 104 meq/L (ref 98–109)
CO2: 25 mEq/L (ref 22–29)
Calcium: 9.4 mg/dL (ref 8.4–10.4)
Creatinine: 0.8 mg/dL (ref 0.6–1.1)
GLUCOSE: 93 mg/dL (ref 70–140)
POTASSIUM: 3.8 meq/L (ref 3.5–5.1)
SODIUM: 138 meq/L (ref 136–145)
Total Bilirubin: 0.34 mg/dL (ref 0.20–1.20)
Total Protein: 7.1 g/dL (ref 6.4–8.3)

## 2017-04-08 MED ORDER — DOXORUBICIN HCL CHEMO IV INJECTION 2 MG/ML
60.0000 mg/m2 | Freq: Once | INTRAVENOUS | Status: AC
  Administered 2017-04-08: 94 mg via INTRAVENOUS
  Filled 2017-04-08: qty 47

## 2017-04-08 MED ORDER — HEPARIN SOD (PORK) LOCK FLUSH 100 UNIT/ML IV SOLN
500.0000 [IU] | Freq: Once | INTRAVENOUS | Status: AC | PRN
Start: 2017-04-08 — End: 2017-04-08
  Administered 2017-04-08: 500 [IU]
  Filled 2017-04-08: qty 5

## 2017-04-08 MED ORDER — SODIUM CHLORIDE 0.9 % IV SOLN
Freq: Once | INTRAVENOUS | Status: AC
  Administered 2017-04-08: 11:00:00 via INTRAVENOUS
  Filled 2017-04-08: qty 5

## 2017-04-08 MED ORDER — SODIUM CHLORIDE 0.9 % IV SOLN
Freq: Once | INTRAVENOUS | Status: AC
  Administered 2017-04-08: 11:00:00 via INTRAVENOUS

## 2017-04-08 MED ORDER — PALONOSETRON HCL INJECTION 0.25 MG/5ML
INTRAVENOUS | Status: AC
  Filled 2017-04-08: qty 5

## 2017-04-08 MED ORDER — SODIUM CHLORIDE 0.9 % IV SOLN
600.0000 mg/m2 | Freq: Once | INTRAVENOUS | Status: AC
  Administered 2017-04-08: 940 mg via INTRAVENOUS
  Filled 2017-04-08: qty 47

## 2017-04-08 MED ORDER — PALONOSETRON HCL INJECTION 0.25 MG/5ML
0.2500 mg | Freq: Once | INTRAVENOUS | Status: AC
  Administered 2017-04-08: 0.25 mg via INTRAVENOUS

## 2017-04-08 MED ORDER — DIPHENHYDRAMINE HCL 50 MG/ML IJ SOLN
INTRAMUSCULAR | Status: AC
  Filled 2017-04-08: qty 1

## 2017-04-08 MED ORDER — SODIUM CHLORIDE 0.9% FLUSH
10.0000 mL | INTRAVENOUS | Status: DC | PRN
  Administered 2017-04-08: 10 mL
  Filled 2017-04-08: qty 10

## 2017-04-08 MED ORDER — PEGFILGRASTIM 6 MG/0.6ML ~~LOC~~ PSKT
6.0000 mg | PREFILLED_SYRINGE | Freq: Once | SUBCUTANEOUS | Status: AC
  Administered 2017-04-08: 6 mg via SUBCUTANEOUS
  Filled 2017-04-08: qty 0.6

## 2017-04-08 MED ORDER — DIPHENHYDRAMINE HCL 50 MG/ML IJ SOLN
25.0000 mg | Freq: Once | INTRAMUSCULAR | Status: AC
  Administered 2017-04-08: 25 mg via INTRAVENOUS

## 2017-04-08 NOTE — Patient Instructions (Signed)
[facility] Discharge Instructions for Patients Receiving Chemotherapy  Today you received the following chemotherapy agents Adriamycin; Cytoxan  To help prevent nausea and vomiting after your treatment, we encourage you to take your nausea medication as directed   If you develop nausea and vomiting that is not controlled by your nausea medication, call the clinic.   BELOW ARE SYMPTOMS THAT SHOULD BE REPORTED IMMEDIATELY:  *FEVER GREATER THAN 100.5 F  *CHILLS WITH OR WITHOUT FEVER  NAUSEA AND VOMITING THAT IS NOT CONTROLLED WITH YOUR NAUSEA MEDICATION  *UNUSUAL SHORTNESS OF BREATH  *UNUSUAL BRUISING OR BLEEDING  TENDERNESS IN MOUTH AND THROAT WITH OR WITHOUT PRESENCE OF ULCERS  *URINARY PROBLEMS  *BOWEL PROBLEMS  UNUSUAL RASH Items with * indicate a potential emergency and should be followed up as soon as possible.  Feel free to call the clinic should you have any questions or concerns. The clinic phone number is [phone].  Please show the CHEMO ALERT CARD at check-in to the Emergency Department and triage nurse.   

## 2017-04-08 NOTE — Progress Notes (Signed)
[facility] Follow up:    Angelica Booze, NP [place] [address] [place] [address]   DIAGNOSIS: Cancer Staging 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, estrogen receptor positive ([place]) Staging form: Breast, AJCC 8th Edition - Clinical stage from 01/09/2017: Stage IB (cT2, cN0, cM0, G2, ER: Positive, PR: Positive, HER2: Negative) - Unsigned - Pathologic stage from 02/13/2017: Stage IB (pT2, pN2a, cM0, G1, ER: Positive, PR: Positive, HER2: Negative) - Signed by Gardenia Phlegm, NP on 02/13/2017   SUMMARY OF ONCOLOGIC HISTORY:   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, estrogen receptor positive ([place])   01/02/2017 Initial Diagnosis    Palpable right breast mass retroareolar 6:30 position: 3.6 cm size axilla negative, 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 HER-2 negative ratio 1.31 Ki-67 3% in addition calcifications UIQ 1.4 cm stereotactic biopsy flat epithelial atypia; clips are 4.3 cm apart, T2 N0 stage IB clinical stage AJCC 8      01/28/2017 Surgery    Bilateral mastectomies: Right: Grade 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 female returns for evaluation prior to receiving her third cycle of chemotherapy.  She is doing well today. She did have one episode of a pinching sensation while sleeping with her port, however that has since resolved.  She is doing well and has no issues today.     Patient Active Problem List   Diagnosis Date Noted  . Cancer of central portion of right female breast  ([place]) 01/28/2017  . 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, estrogen receptor positive ([place]) 01/08/2017    has No Known Allergies.  MEDICAL HISTORY: Past Medical History:  Diagnosis Date  . Anxiety    since breast cancer  . 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, estrogen receptor positive ([place]) 01/08/2017    SURGICAL HISTORY: Past Surgical History:  Procedure Laterality Date  . ABDOMINAL HYSTERECTOMY    . ADENOIDECTOMY W/ MYRINGOTOMY    . APPENDECTOMY    . AXILLARY LYMPH NODE DISSECTION Right 02/14/2017   Procedure: RIGHT AXILLARY LYMPH NODE DISSECTION ERAS PATHWAY;  Surgeon: Jovita Kussmaul, MD;  Location: [place];  Service: General;  Laterality: Right;  . CESAREAN SECTION    . MASTECTOMY W/ SENTINEL NODE BIOPSY Bilateral 01/28/2017   RIGHT BREAST BIOPSY  . MASTECTOMY W/ SENTINEL NODE BIOPSY Bilateral 01/28/2017   Procedure: BILATERAL MASTECTOMY WITH RIGHT SENTINEL LYMPH NODE BIOPSY;  Surgeon: Jovita Kussmaul, MD;  Location: [place];  Service: General;  Laterality: Bilateral;  . PORTACATH PLACEMENT Left 02/14/2017   Procedure: INSERTION PORT-A-CATH;  Surgeon: Jovita Kussmaul, MD;  Location: [place];  Service: General;  Laterality: Left;  . RE-EXCISION OF BREAST CANCER,SUPERIOR MARGINS N/A 02/14/2017   Procedure: RE-EXCISION INFERIOR FLAP;  Surgeon: Jovita Kussmaul, MD;  Location: [place];  Service: General;  Laterality: N/A;  . WISDOM TOOTH EXTRACTION  SOCIAL HISTORY: Social History   Socioeconomic History  . Marital status: Married    Spouse name: Not on file  . Number of children: Not on file  . Years of education: Not on file  . Highest education level: Not on file  Social Needs  . Financial resource strain: Not on file  . Food insecurity - worry: Not on file  . Food insecurity - inability: Not on file  . Transportation needs - medical: Not on file  . Transportation needs - non-medical: Not on file  Occupational  History  . Not on file  Tobacco Use  . Smoking status: Never Smoker  . Smokeless tobacco: Never Used  Substance and Sexual Activity  . Alcohol use: Yes    Alcohol/week: 3.0 oz    Types: 5 Glasses of wine per week    Comment: 02/12/17- no alcohol since cancer diagnosis  . Drug use: No  . Sexual activity: Not on file  Other Topics Concern  . Not on file  Social History Narrative  . Not on file    FAMILY HISTORY: Non contributory  Review of Systems  Constitutional: Negative for appetite change, chills, fatigue, fever and unexpected weight change.  HENT:   Negative for hearing loss and lump/mass.   Eyes: Negative for eye problems and icterus.  Respiratory: Negative for chest tightness, cough and shortness of breath.   Cardiovascular: Negative for chest pain, leg swelling and palpitations.  Gastrointestinal: Negative for abdominal distention, abdominal pain, constipation, diarrhea, nausea and vomiting.  Endocrine: Negative for hot flashes.  Musculoskeletal: Negative for arthralgias.  Skin: Negative for itching and rash.  Neurological: Negative for dizziness, extremity weakness, headaches and numbness.  Hematological: Negative for adenopathy. Does not bruise/bleed easily.  Psychiatric/Behavioral: Negative for depression. The patient is not nervous/anxious.       PHYSICAL EXAMINATION  ECOG PERFORMANCE STATUS: 1 - Symptomatic but completely ambulatory  Vitals:   04/08/17 0959  BP: (!) 147/89  Pulse: 85  Resp: 18  Temp: 98 F (36.7 C)  SpO2: 100%    Physical Exam  Constitutional: She is oriented to person, place, and time and well-developed, well-nourished, and in no distress.  HENT:  Head: Normocephalic and atraumatic.  Mouth/Throat: Oropharynx is clear and moist. No oropharyngeal exudate.  Eyes: Pupils are equal, round, and reactive to light. No scleral icterus.  Neck: Neck supple.  Cardiovascular: Normal rate, regular rhythm, normal heart sounds and intact distal  pulses.  Pulmonary/Chest: Effort normal and breath sounds normal. No respiratory distress. She has no wheezes. She has no rales.  Abdominal: Soft. Bowel sounds are normal. She exhibits no distension. There is no tenderness.  Musculoskeletal: She exhibits no edema.  Lymphadenopathy:    She has no cervical adenopathy.  Neurological: She is alert and oriented to person, place, and time.  Psychiatric: Mood and affect normal.    LABORATORY DATA:  CBC    Component Value Date/Time   WBC 8.9 04/08/2017 0811   WBC 6.2 02/14/2017 0747   RBC 3.95 04/08/2017 0811   RBC 4.39 02/14/2017 0747   HGB 11.8 04/08/2017 0811   HCT 36.0 04/08/2017 0811   PLT 247 04/08/2017 0811   MCV 91.1 04/08/2017 0811   MCH 29.9 04/08/2017 0811   MCH 29.8 02/14/2017 0747   MCHC 32.8 04/08/2017 0811   MCHC 32.9 02/14/2017 0747   RDW 14.5 04/08/2017 0811   LYMPHSABS 1.0 04/08/2017 0811   MONOABS 1.1 (H) 04/08/2017 0811   EOSABS 0.0 04/08/2017 5830  BASOSABS 0.0 04/08/2017 0811    CMP     Component Value Date/Time   NA 138 04/08/2017 0811   K 3.8 04/08/2017 0811   CL 105 02/14/2017 0747   CO2 25 04/08/2017 0811   GLUCOSE 93 04/08/2017 0811   BUN 10.1 04/08/2017 0811   CREATININE 0.8 04/08/2017 0811   CALCIUM 9.4 04/08/2017 0811   PROT 7.1 04/08/2017 0811   ALBUMIN 4.1 04/08/2017 0811   AST 32 04/08/2017 0811   ALT 36 04/08/2017 0811   ALKPHOS 75 04/08/2017 0811   BILITOT 0.34 04/08/2017 0811   GFRNONAA >60 02/14/2017 0747   GFRAA >60 02/14/2017 0747        ASSESSMENT and PLAN:   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, estrogen receptor positive ([place]) 01/28/2017: Bilateral mastectomies: Right: Grade 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 adjuvant chemotherapy with dose dense Adriamycin and Cytoxan x4 followed by Taxol weekly x12 3. Adjuvant radiation 4. Adjuvant antiestrogen therapy with tamoxifen (which was originally started prior to surgery) ---------------------------------------------------------------------- Current treatment: Cycle 3 day 1 dose dense Adriamycin Cytoxan  Angelica Floyd is doing moderately well today.  I reviewed her labs with her in detail.  She will proceed with cycle 3 of chemotherapy today. The pinching sensation is likely just her body adjusting to her port.  She and the nurses know to inform us if she experiences any issues while receiving her chemo.     Angelica Floyd will return in 2 weeks for labs, f/u and her final Adriamycin/Cytoxan.      All questions were answered. The patient knows to call the clinic with any problems, questions or concerns. We can certainly see the patient much sooner if necessary.  A total of (30) minutes of face-to-face time was spent with this patient with greater than 50% of that time in counseling and care-coordination.  This note was electronically signed. Scot Dock, NP 04/08/2017

## 2017-04-08 NOTE — Assessment & Plan Note (Signed)
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 adjuvant chemotherapy with dose dense Adriamycin and Cytoxan x4 followed by Taxol weekly x12 3. Adjuvant radiation 4. Adjuvant antiestrogen therapy with tamoxifen (which was originally started prior to surgery) ---------------------------------------------------------------------- Current treatment: Cycle 3 day 1 dose dense Adriamycin Cytoxan  Angelica Floyd is doing moderately well today.  I reviewed her labs with her in detail.  She will proceed with cycle 3 of chemotherapy today. The pinching sensation is likely just her body adjusting to her port.  She and the nurses know to inform us if she experiences any issues while receiving her chemo.     Angelica Floyd will return in 2 weeks for labs, f/u and her final Adriamycin/Cytoxan.

## 2017-04-10 ENCOUNTER — Encounter: Payer: PRIVATE HEALTH INSURANCE | Admitting: Physical Therapy

## 2017-04-11 ENCOUNTER — Ambulatory Visit: Payer: PRIVATE HEALTH INSURANCE

## 2017-04-11 DIAGNOSIS — R293 Abnormal posture: Secondary | ICD-10-CM

## 2017-04-11 DIAGNOSIS — M25612 Stiffness of left shoulder, not elsewhere classified: Secondary | ICD-10-CM

## 2017-04-11 DIAGNOSIS — Z483 Aftercare following surgery for neoplasm: Secondary | ICD-10-CM

## 2017-04-11 DIAGNOSIS — C50511 Malignant neoplasm of lower-outer quadrant of right female breast: Secondary | ICD-10-CM | POA: Diagnosis not present

## 2017-04-11 DIAGNOSIS — M25611 Stiffness of right shoulder, not elsewhere classified: Secondary | ICD-10-CM

## 2017-04-11 NOTE — Therapy (Signed)
[place], [place], [address] Phone: [phone]   Fax:  [phone]  Physical Therapy Treatment  Patient Details  Name: Angelica Floyd MRN: [id number] Date of Birth: 1966-01-11 Referring Provider: Dr. Nicholas Lose   Encounter Date: 04/11/2017  PT End of Session - 04/11/17 0849    Visit Number  7    Number of Visits  8    Date for PT Re-Evaluation  04/08/17    PT Start Time  0803    PT Stop Time  0849    PT Time Calculation (min)  46 min    Activity Tolerance  Patient tolerated treatment well    Behavior During Therapy  [facility] for tasks assessed/performed       Past Medical History:  Diagnosis Date  . Anxiety    since breast cancer  . 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, estrogen receptor positive ([place]) 01/08/2017    Past Surgical History:  Procedure Laterality Date  . ABDOMINAL HYSTERECTOMY    . ADENOIDECTOMY W/ MYRINGOTOMY    . APPENDECTOMY    . AXILLARY LYMPH NODE DISSECTION Right 02/14/2017   Procedure: RIGHT AXILLARY LYMPH NODE DISSECTION ERAS PATHWAY;  Surgeon: Jovita Kussmaul, MD;  Location: [place];  Service: General;  Laterality: Right;  . CESAREAN SECTION    . MASTECTOMY W/ SENTINEL NODE BIOPSY Bilateral 01/28/2017   RIGHT BREAST BIOPSY  . MASTECTOMY W/ SENTINEL NODE BIOPSY Bilateral 01/28/2017   Procedure: BILATERAL MASTECTOMY WITH RIGHT SENTINEL LYMPH NODE BIOPSY;  Surgeon: Jovita Kussmaul, MD;  Location: [place];  Service: General;  Laterality: Bilateral;  . PORTACATH PLACEMENT Left 02/14/2017   Procedure: INSERTION PORT-A-CATH;  Surgeon: Jovita Kussmaul, MD;  Location: [place];  Service: General;  Laterality: Left;  . RE-EXCISION OF BREAST CANCER,SUPERIOR MARGINS N/A 02/14/2017   Procedure: RE-EXCISION INFERIOR FLAP;  Surgeon: Jovita Kussmaul, MD;  Location: [place];  Service: General;  Laterality: N/A;  . WISDOM TOOTH EXTRACTION      There were no vitals  filed for this visit.  Subjective Assessment - 04/11/17 0805    Subjective  I was feeling good but the Neulasta always hits me hard the day after chemo which was Monday this week, and then Neulasta on Tuesday so yesterday I was in bed all day and had to cancel my appt here. I'm feeling a little better today though my cording is still tight, more so now.     Pertinent History  Bilateral mastectomy for right hormone receptive breast cancer with right ALNB 01/28/17 followed by re-excision and ALND 02/14/17. Undergoing chemotherapy.    Patient Stated Goals  Improve arm function; prevent lymphedema    Currently in Pain?  No/denies         [facility] PT Assessment - 04/11/17 0001      AROM   Right Shoulder Flexion  121 Degrees    Right Shoulder ABduction  104 Degrees    Left Shoulder Flexion  133 Degrees    Left Shoulder ABduction  130 Degrees Pt felt pull at port                  [facility] Adult PT Treatment/Exercise - 04/11/17 0001      Shoulder Exercises: Supine   Horizontal ABduction  Strengthening;Both;5 reps;Theraband    Theraband Level (Shoulder Horizontal ABduction)  Level 1 (Yellow)    External Rotation  Strengthening;Both;5 reps;Theraband    Theraband Level (Shoulder External Rotation)  Level 1 (Yellow)    Flexion  Strengthening;Both;5 reps;Theraband Narrow and Wide Grip, 5 times each    Theraband Level (Shoulder Flexion)  Level 1 (Yellow)    Other Supine Exercises  Bil D2 with yellow theraband 5 times each side.      Shoulder Exercises: Pulleys   Flexion  2 minutes    ABduction  2 minutes      Shoulder Exercises: Therapy Ball   Flexion  10 reps With forward lean into end of stretch    Flexion Limitations  VC to remind pt of correct technique    ABduction  10 reps Bil UE's, pt very tight with this today      Manual Therapy   Manual Therapy  Passive ROM;Myofascial release;Other (comment)    Myofascial Release  very gently to cording in right axilla while performing PROM;  also gently to lateral chest/flank area for increased stretching with P/ROM     Passive ROM  In Supine: Bil shoulders to pts tolerance into flexion, abduction and er             PT Education - 04/11/17 0818    Education provided  Yes    Education Details  Supine scapular series with yellow theraband    Person(s) Educated  Patient    Methods  Explanation;Demonstration;Handout    Comprehension  Verbalized understanding;Returned demonstration           Breast Clinic Goals - 01/09/17 1430      Patient will be able to verbalize understanding of pertinent lymphedema risk reduction practices relevant to her diagnosis specifically related to skin care.   Time  1    Period  Days    Status  Achieved      Patient will be able to return demonstrate and/or verbalize understanding of the post-op home exercise program related to regaining shoulder range of motion.   Time  1    Period  Days    Status  Achieved      Patient will be able to verbalize understanding of the importance of attending the postoperative After Breast Cancer Class for further lymphedema risk reduction education and therapeutic exercise.   Time  1    Period  Days    Status  Achieved       Long Term Clinic Goals - 04/02/17 1201      CC Long Term Goal  #1   Title  Patient will be independent in her home exercise program for improving shoulder ROM.    Status  On-going      CC Long Term Goal  #2   Title  Incease bilateral shoulder active flexion to >/= 130 degrees for increased ease reaching.    Baseline  Rt 110 degrees (cording very limiting) and Lt 133 degrees=04/02/17    Status  Partially Met      CC Long Term Goal  #3   Title  Incease bilateral shoulder active abduction to >/= 130 degrees for increased ease reaching.    Baseline  Rt 103 degrees (cording very limiting) and Lt 134 degrees-04/02/17    Status  Partially Met      CC Long Term Goal  #4   Title  Patient will report she has returned to running  without increased edema in her right lateral trunk.    Baseline  Pt has yet to try this but will this weekend as she is signed up for a run (plans to walk mostly though)-04/02/17    Status  On-going      CC Long Term Goal  #5   Title  Patient will verbalize understanding of lymphedema risk reduction practices.    Status  On-going         Plan - 04/11/17 0849    Clinical Impression Statement  Though reports of feeling tight today, pts Rt flexion A/ROM had improved by 10 degrees and she felt looser after session today. Progressed pt to include supine scapular series which she tolerated very well without any pain, just tightness at some end ranges.     Rehab Potential  Excellent    Clinical Impairments Affecting Rehab Potential  None    PT Frequency  2x / week    PT Duration  4 weeks    PT Treatment/Interventions  Patient/family education;Therapeutic exercise;ADLs/Self Care Home Management;Therapeutic activities;Manual techniques;Manual lymph drainage;Scar mobilization;Passive range of motion    PT Next Visit Plan  Renewal next. See if pt got co,mpressio nsleeve (was measured for this last week) Cont pulleys, ROM exercises, PROM bilateral shoulders to pt tolerance; cont manual therapy, especially for cording at Rt axilla and review HEP    PT Home Exercise Plan  Supine cane exercises and scapular series    Consulted and Agree with Plan of Care  Patient       Patient will benefit from skilled therapeutic intervention in order to improve the following deficits and impairments:  Postural dysfunction, Decreased knowledge of precautions, Pain, Impaired UE functional use, Decreased range of motion  Visit Diagnosis: Abnormal posture  Stiffness of right shoulder, not elsewhere classified  Stiffness of left shoulder, not elsewhere classified  Aftercare following surgery for neoplasm     Problem List Patient Active Problem List   Diagnosis Date Noted  . Cancer of central portion of right  female breast ([place]) 01/28/2017  . 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, estrogen receptor positive ([place]) 01/08/2017    Otelia Limes, PTA 04/11/2017, 8:52 AM  [place], [place], [address] Phone: [phone]   Fax:  [phone]  Name: JALESIA LOUDENSLAGER MRN: [id number] Date of Birth: 11-18-1965

## 2017-04-11 NOTE — Patient Instructions (Signed)

## 2017-04-17 ENCOUNTER — Ambulatory Visit: Payer: PRIVATE HEALTH INSURANCE | Attending: General Surgery | Admitting: Physical Therapy

## 2017-04-17 DIAGNOSIS — M25612 Stiffness of left shoulder, not elsewhere classified: Secondary | ICD-10-CM | POA: Diagnosis present

## 2017-04-17 DIAGNOSIS — R293 Abnormal posture: Secondary | ICD-10-CM | POA: Diagnosis not present

## 2017-04-17 DIAGNOSIS — M25611 Stiffness of right shoulder, not elsewhere classified: Secondary | ICD-10-CM | POA: Insufficient documentation

## 2017-04-17 DIAGNOSIS — Z483 Aftercare following surgery for neoplasm: Secondary | ICD-10-CM

## 2017-04-17 NOTE — Therapy (Signed)
[place], [place], [address] Phone: [phone]   Fax:  [phone]  Physical Therapy Treatment  Patient Details  Name: Angelica Floyd MRN: [id number] Date of Birth: 07/12/1965 Referring Provider: Dr. Nicholas Lose   Encounter Date: 04/17/2017  PT End of Session - 04/17/17 1723    Visit Number  8    Number of Visits  16    Date for PT Re-Evaluation  05/24/17    PT Start Time  1430    PT Stop Time  1517    PT Time Calculation (min)  47 min    Activity Tolerance  Patient tolerated treatment well    Behavior During Therapy  [facility] for tasks assessed/performed       Past Medical History:  Diagnosis Date  . Anxiety    since breast cancer  . 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, estrogen receptor positive ([place]) 01/08/2017    Past Surgical History:  Procedure Laterality Date  . ABDOMINAL HYSTERECTOMY    . ADENOIDECTOMY W/ MYRINGOTOMY    . APPENDECTOMY    . AXILLARY LYMPH NODE DISSECTION Right 02/14/2017   Procedure: RIGHT AXILLARY LYMPH NODE DISSECTION ERAS PATHWAY;  Surgeon: Jovita Kussmaul, MD;  Location: [place];  Service: General;  Laterality: Right;  . CESAREAN SECTION    . MASTECTOMY W/ SENTINEL NODE BIOPSY Bilateral 01/28/2017   RIGHT BREAST BIOPSY  . MASTECTOMY W/ SENTINEL NODE BIOPSY Bilateral 01/28/2017   Procedure: BILATERAL MASTECTOMY WITH RIGHT SENTINEL LYMPH NODE BIOPSY;  Surgeon: Jovita Kussmaul, MD;  Location: [place];  Service: General;  Laterality: Bilateral;  . PORTACATH PLACEMENT Left 02/14/2017   Procedure: INSERTION PORT-A-CATH;  Surgeon: Jovita Kussmaul, MD;  Location: [place];  Service: General;  Laterality: Left;  . RE-EXCISION OF BREAST CANCER,SUPERIOR MARGINS N/A 02/14/2017   Procedure: RE-EXCISION INFERIOR FLAP;  Surgeon: Jovita Kussmaul, MD;  Location: [place];  Service: General;  Laterality: N/A;  . WISDOM TOOTH EXTRACTION      There were no vitals  filed for this visit.  Subjective Assessment - 04/17/17 1431    Subjective  Nothing new since last time. Feeling pretty good today. Has a compression sleeve ordered.  The wrong one came in but they reordered one. It always helps when Val works on the cord, though it hasn't let loose yet.  Doing okay with the supine scapular exercises.    Pertinent History  Bilateral mastectomy for right hormone receptive breast cancer with right ALNB 01/28/17 followed by re-excision and ALND 02/14/17. Undergoing chemotherapy.    Currently in Pain?  No/denies         [facility] PT Assessment - 04/17/17 0001      AROM   Right Shoulder Flexion  120 Degrees    Right Shoulder ABduction  104 Degrees                  [facility] Adult PT Treatment/Exercise - 04/17/17 0001      Self-Care   Self-Care  Other Self-Care Comments    Other Self-Care Comments   lymphedema risk reduction practices reviewed      Shoulder Exercises: Pulleys   Flexion  2 minutes    ABduction  2 minutes      Shoulder Exercises: Therapy Ball   Flexion  10 reps With forward lean into end of stretch    Flexion Limitations  VC to slow down    ABduction  10 reps Bil  UE's      Manual Therapy   Manual Therapy  Soft tissue mobilization    Soft tissue mobilization  to cords at right axilla and right antecubital fossa    Myofascial Release  two-way release from right upper axilla to right flank/right abdomen    Passive ROM  In Supine: Bil shoulders to pts tolerance into flexion, abduction and er             PT Education - 04/17/17 1725    Education provided  Yes    Education Details  lymphedema risk reduction    Person(s) Educated  Patient    Methods  Explanation;Handout    Comprehension  Verbalized understanding           Breast Clinic Goals - 01/09/17 1430      Patient will be able to verbalize understanding of pertinent lymphedema risk reduction practices relevant to her diagnosis specifically related to skin care.    Time  1    Period  Days    Status  Achieved      Patient will be able to return demonstrate and/or verbalize understanding of the post-op home exercise program related to regaining shoulder range of motion.   Time  1    Period  Days    Status  Achieved      Patient will be able to verbalize understanding of the importance of attending the postoperative After Breast Cancer Class for further lymphedema risk reduction education and therapeutic exercise.   Time  1    Period  Days    Status  Achieved       Long Term Clinic Goals - 04/17/17 1437      CC Long Term Goal  #1   Title  Patient will be independent in her home exercise program for improving shoulder ROM.    Status  Partially Met      CC Long Term Goal  #2   Title  Incease bilateral shoulder active flexion to >/= 130 degrees for increased ease reaching.    Status  Partially Met      CC Long Term Goal  #3   Title  Incease bilateral shoulder active abduction to >/= 130 degrees for increased ease reaching.    Status  Partially Met      CC Long Term Goal  #4   Title  Patient will report she has returned to running without increased edema in her right lateral trunk.    Baseline  Did run a half of the Thanksgiving [place] trot, but hasn't run since last chemo due to cold.    Status  Partially Met      CC Long Term Goal  #5   Title  Patient will verbalize understanding of lymphedema risk reduction practices.    Status  Achieved         Plan - 04/17/17 1723    Clinical Impression Statement  Still with tightness that benefits from exercise and manual therapy. Goals partially met to date. Achieved lymphedema risk reduction education goal today.    Rehab Potential  Excellent    Clinical Impairments Affecting Rehab Potential  None    PT Frequency  2x / week    PT Duration  4 weeks    PT Treatment/Interventions  Patient/family education;Therapeutic exercise;ADLs/Self Care Home Management;Therapeutic activities;Manual  techniques;Manual lymph drainage;Scar mobilization;Passive range of motion    PT Next Visit Plan  Cont pulleys, ROM exercises, PROM bilateral shoulders to pt tolerance; cont manual therapy,  especially for cording at Rt axilla and review HEP    PT Home Exercise Plan  Supine cane exercises and scapular series    Consulted and Agree with Plan of Care  Patient       Patient will benefit from skilled therapeutic intervention in order to improve the following deficits and impairments:  Postural dysfunction, Decreased knowledge of precautions, Pain, Impaired UE functional use, Decreased range of motion  Visit Diagnosis: Abnormal posture - Plan: PT plan of care cert/re-cert  Stiffness of right shoulder, not elsewhere classified - Plan: PT plan of care cert/re-cert  Stiffness of left shoulder, not elsewhere classified - Plan: PT plan of care cert/re-cert  Aftercare following surgery for neoplasm - Plan: PT plan of care cert/re-cert     Problem List Patient Active Problem List   Diagnosis Date Noted  . Cancer of central portion of right female breast ([place]) 01/28/2017  . 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, estrogen receptor positive ([place]) 01/08/2017    SALISBURY,DONNA 04/17/2017, 5:29 PM  [place], [place], [address] Phone: [phone]   Fax:  [phone]  Name: Angelica Floyd MRN: [id number] Date of Birth: Mar 24, 1966  Serafina Royals, PT 04/17/17 5:29 PM

## 2017-04-19 ENCOUNTER — Encounter: Payer: Self-pay | Admitting: Physical Therapy

## 2017-04-19 ENCOUNTER — Other Ambulatory Visit: Payer: Self-pay

## 2017-04-19 ENCOUNTER — Ambulatory Visit: Payer: PRIVATE HEALTH INSURANCE | Admitting: Physical Therapy

## 2017-04-19 DIAGNOSIS — R293 Abnormal posture: Secondary | ICD-10-CM | POA: Diagnosis not present

## 2017-04-19 DIAGNOSIS — Z483 Aftercare following surgery for neoplasm: Secondary | ICD-10-CM

## 2017-04-19 DIAGNOSIS — M25611 Stiffness of right shoulder, not elsewhere classified: Secondary | ICD-10-CM

## 2017-04-19 DIAGNOSIS — M25612 Stiffness of left shoulder, not elsewhere classified: Secondary | ICD-10-CM

## 2017-04-19 NOTE — Therapy (Signed)
[place], [place], [address] Phone: [phone]   Fax:  [phone]  Physical Therapy Treatment  Patient Details  Name: Angelica Floyd MRN: [id number] Date of Birth: [birth year] Referring Provider: Dr. Nicholas Lose   Encounter Date: 04/19/2017    Past Medical History:  Diagnosis Date  . Anxiety    since breast cancer  . 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, estrogen receptor positive ([place]) 01/08/2017    Past Surgical History:  Procedure Laterality Date  . ABDOMINAL HYSTERECTOMY    . ADENOIDECTOMY W/ MYRINGOTOMY    . APPENDECTOMY    . AXILLARY LYMPH NODE DISSECTION Right 02/14/2017   Procedure: RIGHT AXILLARY LYMPH NODE DISSECTION ERAS PATHWAY;  Surgeon: Jovita Kussmaul, MD;  Location: [place];  Service: General;  Laterality: Right;  . CESAREAN SECTION    . MASTECTOMY W/ SENTINEL NODE BIOPSY Bilateral 01/28/2017   RIGHT BREAST BIOPSY  . MASTECTOMY W/ SENTINEL NODE BIOPSY Bilateral 01/28/2017   Procedure: BILATERAL MASTECTOMY WITH RIGHT SENTINEL LYMPH NODE BIOPSY;  Surgeon: Jovita Kussmaul, MD;  Location: [place];  Service: General;  Laterality: Bilateral;  . PORTACATH PLACEMENT Left 02/14/2017   Procedure: INSERTION PORT-A-CATH;  Surgeon: Jovita Kussmaul, MD;  Location: [place];  Service: General;  Laterality: Left;  . RE-EXCISION OF BREAST CANCER,SUPERIOR MARGINS N/A 02/14/2017   Procedure: RE-EXCISION INFERIOR FLAP;  Surgeon: Jovita Kussmaul, MD;  Location: [place];  Service: General;  Laterality: N/A;  . WISDOM TOOTH EXTRACTION      There were no vitals filed for this visit.  Subjective Assessment - 04/19/17 0857    Subjective  Pt is still having tightness in right axilla . She will have another infusion on Wednesday     Pertinent History  Bilateral mastectomy for right hormone receptive breast cancer with right ALNB 01/28/17 followed by re-excision and ALND 02/14/17. Undergoing  chemotherapy.    Patient Stated Goals  Improve arm function; prevent lymphedema    Currently in Pain?  No/denies                      [facility] Adult PT Treatment/Exercise - 04/19/17 0001      Self-Care   Self-Care  Other Self-Care Comments    Other Self-Care Comments   tg soft with large fold at upper arm to elbow to see if it will help with cording       Shoulder Exercises: Sidelying   ABduction  AROM;Right;5 reps    Other Sidelying Exercises  small circles with hand pointed to ceiling     Other Sidelying Exercises  horizontal abduction with posterio thoraic rotation  pt limited by pulling in chest       Shoulder Exercises: Pulleys   Flexion  2 minutes    ABduction  2 minutes      Shoulder Exercises: Therapy Ball   Flexion  10 reps With forward lean into end of stretch    ABduction  10 reps Bil UE's      Shoulder Exercises: Stretch   Table Stretch - Flexion  1 rep    Table Stretch - Abduction  1 rep      Manual Therapy   Soft tissue mobilization  to cords at right axilla and right antecubital fossa    Myofascial Release  very gently to cording in right axilla while performing PROM; also gently to lateral chest/flank area for increased stretching with P/ROM  Passive ROM  In Supine: Bil shoulders to pts tolerance into flexion, abduction and er                   Breast Clinic Goals - 01/09/17 1430      Patient will be able to verbalize understanding of pertinent lymphedema risk reduction practices relevant to her diagnosis specifically related to skin care.   Time  1    Period  Days    Status  Achieved      Patient will be able to return demonstrate and/or verbalize understanding of the post-op home exercise program related to regaining shoulder range of motion.   Time  1    Period  Days    Status  Achieved      Patient will be able to verbalize understanding of the importance of attending the postoperative After Breast Cancer Class for further  lymphedema risk reduction education and therapeutic exercise.   Time  1    Period  Days    Status  Achieved       Long Term Clinic Goals - 04/17/17 1437      CC Long Term Goal  #1   Title  Patient will be independent in her home exercise program for improving shoulder ROM.    Status  Partially Met      CC Long Term Goal  #2   Title  Incease bilateral shoulder active flexion to >/= 130 degrees for increased ease reaching.    Status  Partially Met      CC Long Term Goal  #3   Title  Incease bilateral shoulder active abduction to >/= 130 degrees for increased ease reaching.    Status  Partially Met      CC Long Term Goal  #4   Title  Patient will report she has returned to running without increased edema in her right lateral trunk.    Baseline  Did run a half of the Thanksgiving [place] trot, but hasn't run since last chemo due to cold.    Status  Partially Met      CC Long Term Goal  #5   Title  Patient will verbalize understanding of lymphedema risk reduction practices.    Status  Achieved         Plan - 04/19/17 1126    Clinical Impression Statement  Pt continues with tightness and cording in right axilla and lateral chest to elbow.  Encouraged use of tg soft for gentle compression and added table stretch today     PT Next Visit Plan  assess effect of tg soft Cont pulleys, ROM exercises, PROM bilateral shoulders to pt tolerance; cont manual therapy, especially for cording at Rt axilla and review HEP    Consulted and Agree with Plan of Care  Patient       Patient will benefit from skilled therapeutic intervention in order to improve the following deficits and impairments:  Postural dysfunction, Decreased knowledge of precautions, Pain, Impaired UE functional use, Decreased range of motion  Visit Diagnosis: Abnormal posture  Stiffness of right shoulder, not elsewhere classified  Stiffness of left shoulder, not elsewhere classified  Aftercare following surgery for  neoplasm     Problem List Patient Active Problem List   Diagnosis Date Noted  . Cancer of central portion of right female breast ([place]) 01/28/2017  . 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, estrogen receptor positive ([name]) 01/08/2017   Donato Heinz. Owens Shark PT  Owens Shark,  Elder Cyphers 04/19/2017, 11:28 AM  [place], [place], [address] Phone: [phone]   Fax:  [phone]  Name: Angelica Floyd MRN: [id number] Date of Birth: 1966/03/11

## 2017-04-24 ENCOUNTER — Other Ambulatory Visit (HOSPITAL_BASED_OUTPATIENT_CLINIC_OR_DEPARTMENT_OTHER): Payer: PRIVATE HEALTH INSURANCE

## 2017-04-24 ENCOUNTER — Ambulatory Visit: Payer: PRIVATE HEALTH INSURANCE

## 2017-04-24 ENCOUNTER — Encounter: Payer: Self-pay | Admitting: *Deleted

## 2017-04-24 ENCOUNTER — Ambulatory Visit (HOSPITAL_BASED_OUTPATIENT_CLINIC_OR_DEPARTMENT_OTHER): Payer: PRIVATE HEALTH INSURANCE | Admitting: Hematology and Oncology

## 2017-04-24 ENCOUNTER — Ambulatory Visit (HOSPITAL_BASED_OUTPATIENT_CLINIC_OR_DEPARTMENT_OTHER): Payer: PRIVATE HEALTH INSURANCE

## 2017-04-24 VITALS — HR 95

## 2017-04-24 DIAGNOSIS — Z170 Estrogen receptor positive status [ER+]: Principal | ICD-10-CM

## 2017-04-24 DIAGNOSIS — C50511 Malignant neoplasm of lower-outer quadrant of right female breast: Secondary | ICD-10-CM

## 2017-04-24 DIAGNOSIS — C773 Secondary and unspecified malignant neoplasm of axilla and upper limb lymph nodes: Secondary | ICD-10-CM

## 2017-04-24 DIAGNOSIS — M898X9 Other specified disorders of bone, unspecified site: Secondary | ICD-10-CM

## 2017-04-24 DIAGNOSIS — Z5111 Encounter for antineoplastic chemotherapy: Secondary | ICD-10-CM | POA: Diagnosis not present

## 2017-04-24 LAB — COMPREHENSIVE METABOLIC PANEL
ALBUMIN: 4.1 g/dL (ref 3.5–5.0)
ALT: 36 U/L (ref 0–55)
AST: 33 U/L (ref 5–34)
Alkaline Phosphatase: 83 U/L (ref 40–150)
Anion Gap: 12 mEq/L — ABNORMAL HIGH (ref 3–11)
BUN: 11.8 mg/dL (ref 7.0–26.0)
CHLORIDE: 103 meq/L (ref 98–109)
CO2: 23 mEq/L (ref 22–29)
Calcium: 9.3 mg/dL (ref 8.4–10.4)
Creatinine: 0.8 mg/dL (ref 0.6–1.1)
EGFR: >60 mL/min/{1.73_m2} (ref >60)
GLUCOSE: 113 mg/dL (ref 70–140)
POTASSIUM: 3.5 meq/L (ref 3.5–5.1)
SODIUM: 139 meq/L (ref 136–145)
TOTAL PROTEIN: 7.1 g/dL (ref 6.4–8.3)
Total Bilirubin: 0.32 mg/dL (ref 0.20–1.20)

## 2017-04-24 LAB — CBC WITH DIFFERENTIAL/PLATELET
BASO%: 0.5 % (ref 0.0–2.0)
Basophils Absolute: 0.1 10*3/uL (ref 0.0–0.1)
EOS ABS: 0.1 10*3/uL (ref 0.0–0.5)
EOS%: 0.9 % (ref 0.0–7.0)
HCT: 35.3 % (ref 34.8–46.6)
HGB: 11.5 g/dL — ABNORMAL LOW (ref 11.6–15.9)
LYMPH%: 10.6 % — AB (ref 14.0–49.7)
MCH: 30.3 pg (ref 25.1–34.0)
MCHC: 32.6 g/dL (ref 31.5–36.0)
MCV: 92.9 fL (ref 79.5–101.0)
MONO#: 1.1 10*3/uL — AB (ref 0.1–0.9)
MONO%: 10.6 % (ref 0.0–14.0)
NEUT#: 7.8 10*3/uL — ABNORMAL HIGH (ref 1.5–6.5)
NEUT%: 77.4 % — AB (ref 38.4–76.8)
PLATELETS: 228 10*3/uL (ref 145–400)
RBC: 3.8 10*6/uL (ref 3.70–5.45)
RDW: 16.1 % — ABNORMAL HIGH (ref 11.2–14.5)
WBC: 10.1 10*3/uL (ref 3.9–10.3)
lymph#: 1.1 10*3/uL (ref 0.9–3.3)

## 2017-04-24 MED ORDER — DIPHENHYDRAMINE HCL 50 MG/ML IJ SOLN
25.0000 mg | Freq: Once | INTRAMUSCULAR | Status: AC
  Administered 2017-04-24: 25 mg via INTRAVENOUS

## 2017-04-24 MED ORDER — SODIUM CHLORIDE 0.9% FLUSH
10.0000 mL | INTRAVENOUS | Status: DC | PRN
  Administered 2017-04-24: 10 mL
  Filled 2017-04-24: qty 10

## 2017-04-24 MED ORDER — PEGFILGRASTIM 6 MG/0.6ML ~~LOC~~ PSKT
6.0000 mg | PREFILLED_SYRINGE | Freq: Once | SUBCUTANEOUS | Status: AC
  Administered 2017-04-24: 6 mg via SUBCUTANEOUS

## 2017-04-24 MED ORDER — SODIUM CHLORIDE 0.9 % IV SOLN
Freq: Once | INTRAVENOUS | Status: AC
  Administered 2017-04-24: 10:00:00 via INTRAVENOUS

## 2017-04-24 MED ORDER — PALONOSETRON HCL INJECTION 0.25 MG/5ML
0.2500 mg | Freq: Once | INTRAVENOUS | Status: AC
  Administered 2017-04-24: 0.25 mg via INTRAVENOUS

## 2017-04-24 MED ORDER — CYCLOPHOSPHAMIDE CHEMO INJECTION 1 GM
600.0000 mg/m2 | Freq: Once | INTRAMUSCULAR | Status: AC
  Administered 2017-04-24: 940 mg via INTRAVENOUS
  Filled 2017-04-24: qty 47

## 2017-04-24 MED ORDER — DIPHENHYDRAMINE HCL 50 MG/ML IJ SOLN
INTRAMUSCULAR | Status: AC
  Filled 2017-04-24: qty 1

## 2017-04-24 MED ORDER — PALONOSETRON HCL INJECTION 0.25 MG/5ML
INTRAVENOUS | Status: AC
  Filled 2017-04-24: qty 5

## 2017-04-24 MED ORDER — DOXORUBICIN HCL CHEMO IV INJECTION 2 MG/ML
60.0000 mg/m2 | Freq: Once | INTRAVENOUS | Status: AC
  Administered 2017-04-24: 94 mg via INTRAVENOUS
  Filled 2017-04-24: qty 47

## 2017-04-24 MED ORDER — PEGFILGRASTIM 6 MG/0.6ML ~~LOC~~ PSKT
PREFILLED_SYRINGE | SUBCUTANEOUS | Status: AC
  Filled 2017-04-24: qty 0.6

## 2017-04-24 MED ORDER — HEPARIN SOD (PORK) LOCK FLUSH 100 UNIT/ML IV SOLN
500.0000 [IU] | Freq: Once | INTRAVENOUS | Status: AC | PRN
  Administered 2017-04-24: 500 [IU]
  Filled 2017-04-24: qty 5

## 2017-04-24 MED ORDER — SODIUM CHLORIDE 0.9 % IV SOLN
Freq: Once | INTRAVENOUS | Status: AC
  Administered 2017-04-24: 10:00:00 via INTRAVENOUS
  Filled 2017-04-24: qty 5

## 2017-04-24 NOTE — Assessment & Plan Note (Signed)
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 adjuvant chemotherapy with dose dense Adriamycin and Cytoxan x4 followed by Taxol weekly x12 3. Adjuvant radiation 4. Adjuvant antiestrogen therapy with tamoxifen (which was originally started prior to surgery) ---------------------------------------------------------------------- Current treatment: Cycle 4 day 1 dose dense Adriamycin Cytoxan  Chemo toxicities: 1.  Alopecia 2. Fatigue  Return to clinic in 2 weeks for cycle 1 of Taxol

## 2017-04-24 NOTE — Patient Instructions (Signed)
[facility] Discharge Instructions for Patients Receiving Chemotherapy  Today you received the following chemotherapy agents Adriamycin; Cytoxan  To help prevent nausea and vomiting after your treatment, we encourage you to take your nausea medication as directed   If you develop nausea and vomiting that is not controlled by your nausea medication, call the clinic.   BELOW ARE SYMPTOMS THAT SHOULD BE REPORTED IMMEDIATELY:  *FEVER GREATER THAN 100.5 F  *CHILLS WITH OR WITHOUT FEVER  NAUSEA AND VOMITING THAT IS NOT CONTROLLED WITH YOUR NAUSEA MEDICATION  *UNUSUAL SHORTNESS OF BREATH  *UNUSUAL BRUISING OR BLEEDING  TENDERNESS IN MOUTH AND THROAT WITH OR WITHOUT PRESENCE OF ULCERS  *URINARY PROBLEMS  *BOWEL PROBLEMS  UNUSUAL RASH Items with * indicate a potential emergency and should be followed up as soon as possible.  Feel free to call the clinic should you have any questions or concerns. The clinic phone number is [phone].  Please show the CHEMO ALERT CARD at check-in to the Emergency Department and triage nurse.   

## 2017-04-24 NOTE — Progress Notes (Signed)
Patient Care Team: Jettie Booze, NP as PCP - General (Family Medicine) Nicholas Lose, MD as Consulting Physician (Hematology and Oncology) Jovita Kussmaul, MD as Consulting Physician (General Surgery) Gery Pray, MD as Consulting Physician (Radiation Oncology)  DIAGNOSIS:  Encounter Diagnosis  Name Primary?  . 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, estrogen receptor positive ([place])     SUMMARY OF ONCOLOGIC HISTORY:   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, estrogen receptor positive ([place])   01/02/2017 Initial Diagnosis    Palpable right breast mass retroareolar 6:30 position: 3.6 cm size axilla negative, 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 HER-2 negative ratio 1.31 Ki-67 3% in addition calcifications UIQ 1.4 cm stereotactic biopsy flat epithelial atypia; clips are 4.3 cm apart, T2 N0 stage IB clinical stage AJCC 8      01/28/2017 Surgery    Bilateral mastectomies: Right: Grade 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%, PR 95%, HER-2 negative ratio 1.31, Ki-67 3%, 4/4 lymph nodes positive; left mastectomy: PASH and FC changes, no malignancy; T2 N2,  stage IIA AJCC 8      02/14/2017 Surgery    Right axillary lymph node dissection 8/11 lymph nodes positive      03/06/2017 -  Chemotherapy    Dose dense AC x4 followed by Taxol x12       CHIEF COMPLIANT: Cycle 4 dose dense Adriamycin and Cytoxan  INTERVAL HISTORY: Angelica Floyd is a 51 year old with above-mentioned history of bilateral mastectomies for right breast cancer who is here to receive her fourth cycle of dose dense Adriamycin and Cytoxan.  Her major issue is been related to Neulasta.  She does get a lot of bone pain related to it in spite of Claritin.  She denies any nausea vomiting.  REVIEW OF SYSTEMS:   Constitutional: Denies fevers, chills or abnormal weight loss Eyes: Denies blurriness of vision Ears, nose, mouth, throat, and face: Denies mucositis or sore  throat Respiratory: Denies cough, dyspnea or wheezes Cardiovascular: Denies palpitation, chest discomfort Gastrointestinal:  Denies nausea, heartburn or change in bowel habits Skin: Denies abnormal skin rashes Lymphatics: Denies new lymphadenopathy or easy bruising Neurological:Denies numbness, tingling or new weaknesses Behavioral/Psych: Mood is stable, no new changes  Extremities: No lower extremity edema Breast:  denies any pain or lumps or nodules in either breasts All other systems were reviewed with the patient and are negative.  I have reviewed the past medical history, past surgical history, social history and family history with the patient and they are unchanged from previous note.  ALLERGIES:  has No Known Allergies.  MEDICATIONS:  Current Outpatient Medications  Medication Sig Dispense Refill  . Acetaminophen (TYLENOL CHILDRENS CHEWABLES PO) Take by mouth as needed.    [place] Kitchen dexamethasone (DECADRON) 4 MG tablet Take 1 tablet (4 mg total) by mouth daily. Take 1 tablet daily after chemotherapy and one tablet 2 days after chemotherapy with food (Patient not taking: Reported on 04/08/2017) 8 tablet 0  . ketotifen (ZADITOR) 0.025 % ophthalmic solution Apply 1 drop to eye daily as needed (allergies).    Marland Kitchen lidocaine-prilocaine (EMLA) cream Apply to affected area once 30 g 3  . loratadine (CLARITIN) 10 MG tablet Take 10 mg by mouth as needed for allergies.    Marland Kitchen LORazepam (ATIVAN) 0.5 MG tablet Take 1 tablet (0.5 mg total) by mouth at bedtime. 30 tablet 0  . Multiple Vitamins-Minerals (CENTRUM ADULTS) TABS Take 1 tablet by mouth daily.     Marland Kitchen  ondansetron (ZOFRAN) 8 MG tablet Take 1 tablet (8 mg total) by mouth 2 (two) times daily as needed. Start on the third day after chemotherapy. 30 tablet 1  . prochlorperazine (COMPAZINE) 10 MG tablet Take 1 tablet (10 mg total) by mouth every 6 (six) hours as needed (Nausea or vomiting). 30 tablet 1   No current facility-administered medications for  this visit.     PHYSICAL EXAMINATION: ECOG PERFORMANCE STATUS: 1 - Symptomatic but completely ambulatory  Vitals:   04/24/17 0850  BP: 125/84  Pulse: (!) 116  Resp: 12  Temp: 97.6 F (36.4 C)  SpO2: 100%   Filed Weights   04/24/17 0850  Weight: 117 lb 1.6 oz (53.1 kg)    GENERAL:alert, no distress and comfortable SKIN: skin color, texture, turgor are normal, no rashes or significant lesions EYES: normal, Conjunctiva are pink and non-injected, sclera clear OROPHARYNX:no exudate, no erythema and lips, buccal mucosa, and tongue normal  NECK: supple, thyroid normal size, non-tender, without nodularity LYMPH:  no palpable lymphadenopathy in the cervical, axillary or inguinal LUNGS: clear to auscultation and percussion with normal breathing effort HEART: regular rate & rhythm and no murmurs and no lower extremity edema ABDOMEN:abdomen soft, non-tender and normal bowel sounds MUSCULOSKELETAL:no cyanosis of digits and no clubbing  NEURO: alert & oriented x 3 with fluent speech, no focal motor/sensory deficits EXTREMITIES: No lower extremity edema  LABORATORY DATA:  I have reviewed the data as listed   Chemistry      Component Value Date/Time   NA 138 04/08/2017 0811   K 3.8 04/08/2017 0811   CL 105 02/14/2017 0747   CO2 25 04/08/2017 0811   BUN 10.1 04/08/2017 0811   CREATININE 0.8 04/08/2017 0811      Component Value Date/Time   CALCIUM 9.4 04/08/2017 0811   ALKPHOS 75 04/08/2017 0811   AST 32 04/08/2017 0811   ALT 36 04/08/2017 0811   BILITOT 0.34 04/08/2017 0811       Lab Results  Component Value Date   WBC 8.9 04/08/2017   HGB 11.8 04/08/2017   HCT 36.0 04/08/2017   MCV 91.1 04/08/2017   PLT 247 04/08/2017   NEUTROABS 6.8 (H) 04/08/2017    ASSESSMENT & PLAN:  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, estrogen receptor positive ([place]) 01/28/2017: Bilateral mastectomies: Right: Grade 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%, PR 95%, HER-2  negative ratio 1.31, Ki-67 3%, 4/4 lymph nodes positive; left mastectomy: PASH and FC changes, no malignancy; T2 N2, stage IIA AJCC 8 02-14-17: 8/10 lymph nodes positive  CT chest 02/13/2017: 3 mm right middle lobe nodule likely benign benign cysts in the liver, ovarian cysts few small sclerotic lesions in the bone likely bone [place] Bone scan 02/13/2017: No bone metastases  Treatment plan: 1. adjuvant chemotherapy with dose dense Adriamycin and Cytoxan x4 followed by Taxol weekly x12 3. Adjuvant radiation 4. Adjuvant antiestrogen therapy with tamoxifen (which was originally started prior to surgery) ---------------------------------------------------------------------- Current treatment: Cycle 4 day 1 dose dense Adriamycin Cytoxan  Chemo toxicities: 1.  Alopecia 2. Fatigue  Return to clinic in 2 weeks for cycle 1 of Taxol   I spent 25 minutes talking to the patient of which more than half was spent in counseling and coordination of care.  No orders of the defined types were placed in this encounter.  The patient has a good understanding of the overall plan. she agrees with it. she will call with any problems that  may develop before the next visit here.   Rulon Eisenmenger, MD 04/24/17

## 2017-04-25 ENCOUNTER — Ambulatory Visit: Payer: PRIVATE HEALTH INSURANCE | Admitting: Physical Therapy

## 2017-04-25 ENCOUNTER — Encounter: Payer: Self-pay | Admitting: Physical Therapy

## 2017-04-25 DIAGNOSIS — R293 Abnormal posture: Secondary | ICD-10-CM

## 2017-04-25 DIAGNOSIS — M25612 Stiffness of left shoulder, not elsewhere classified: Secondary | ICD-10-CM

## 2017-04-25 DIAGNOSIS — M25611 Stiffness of right shoulder, not elsewhere classified: Secondary | ICD-10-CM

## 2017-04-25 NOTE — Therapy (Signed)
[place], [place], [address] Phone: [phone]   Fax:  [phone]  Physical Therapy Treatment  Patient Details  Name: Angelica Floyd MRN: [id number] Date of Birth: [birth year] Referring Provider: Dr. Nicholas Lose   Encounter Date: 04/25/2017  PT End of Session - 04/25/17 1717    Visit Number  9    Number of Visits  16    Date for PT Re-Evaluation  05/24/17    PT Start Time  1438 pt arrived late    PT Stop Time  1517    PT Time Calculation (min)  39 min    Activity Tolerance  Patient tolerated treatment well    Behavior During Therapy  [facility] for tasks assessed/performed       Past Medical History:  Diagnosis Date  . Anxiety    since breast cancer  . 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, estrogen receptor positive ([place]) 01/08/2017    Past Surgical History:  Procedure Laterality Date  . ABDOMINAL HYSTERECTOMY    . ADENOIDECTOMY W/ MYRINGOTOMY    . APPENDECTOMY    . AXILLARY LYMPH NODE DISSECTION Right 02/14/2017   Procedure: RIGHT AXILLARY LYMPH NODE DISSECTION ERAS PATHWAY;  Surgeon: Jovita Kussmaul, MD;  Location: [place];  Service: General;  Laterality: Right;  . CESAREAN SECTION    . MASTECTOMY W/ SENTINEL NODE BIOPSY Bilateral 01/28/2017   RIGHT BREAST BIOPSY  . MASTECTOMY W/ SENTINEL NODE BIOPSY Bilateral 01/28/2017   Procedure: BILATERAL MASTECTOMY WITH RIGHT SENTINEL LYMPH NODE BIOPSY;  Surgeon: Jovita Kussmaul, MD;  Location: [facility];  Service: General;  Laterality: Bilateral;  . PORTACATH PLACEMENT Left 02/14/2017   Procedure: INSERTION PORT-A-CATH;  Surgeon: Jovita Kussmaul, MD;  Location: [place];  Service: General;  Laterality: Left;  . RE-EXCISION OF BREAST CANCER,SUPERIOR MARGINS N/A 02/14/2017   Procedure: RE-EXCISION INFERIOR FLAP;  Surgeon: Jovita Kussmaul, MD;  Location: [facility];  Service: General;  Laterality: N/A;  . WISDOM TOOTH EXTRACTION      There  were no vitals filed for this visit.  Subjective Assessment - 04/25/17 1441    Subjective  I have a Neulasta patch on the L side so I don't want to work it very hard. The tightness in my right armpit is about the same.     Pertinent History  Bilateral mastectomy for right hormone receptive breast cancer with right ALNB 01/28/17 followed by re-excision and ALND 02/14/17. Undergoing chemotherapy.    Patient Stated Goals  Improve arm function; prevent lymphedema    Currently in Pain?  No/denies                      [facility] Adult PT Treatment/Exercise - 04/25/17 0001      Shoulder Exercises: Pulleys   Flexion  2 minutes    ABduction  2 minutes      Shoulder Exercises: Therapy Ball   Flexion  10 reps With forward lean into end of stretch    ABduction  10 reps bil UEs      Manual Therapy   Soft tissue mobilization  to cords at right axilla and right antecubital fossa    Myofascial Release  very gently to cording in right axilla while performing PROM; also gently to lateral chest/flank area for increased stretching with P/ROM     Passive ROM  In Supine: R shoulder to pts tolerance into flexion, abduction and er  Breast Clinic Goals - 01/09/17 1430      Patient will be able to verbalize understanding of pertinent lymphedema risk reduction practices relevant to her diagnosis specifically related to skin care.   Time  1    Period  Days    Status  Achieved      Patient will be able to return demonstrate and/or verbalize understanding of the post-op home exercise program related to regaining shoulder range of motion.   Time  1    Period  Days    Status  Achieved      Patient will be able to verbalize understanding of the importance of attending the postoperative After Breast Cancer Class for further lymphedema risk reduction education and therapeutic exercise.   Time  1    Period  Days    Status  Achieved       Long Term Clinic Goals - 04/17/17  1437      CC Long Term Goal  #1   Title  Patient will be independent in her home exercise program for improving shoulder ROM.    Status  Partially Met      CC Long Term Goal  #2   Title  Incease bilateral shoulder active flexion to >/= 130 degrees for increased ease reaching.    Status  Partially Met      CC Long Term Goal  #3   Title  Incease bilateral shoulder active abduction to >/= 130 degrees for increased ease reaching.    Status  Partially Met      CC Long Term Goal  #4   Title  Patient will report she has returned to running without increased edema in her right lateral trunk.    Baseline  Did run a half of the Thanksgiving [place] trot, but hasn't run since last chemo due to cold.    Status  Partially Met      CC Long Term Goal  #5   Title  Patient will verbalize understanding of lymphedema risk reduction practices.    Status  Achieved         Plan - 04/25/17 1717    Clinical Impression Statement  Pt is limited by tightness and cording in axilla. Pt did gain some ROM following PROM today per visual estimate. No PROM performed to LUE today because pt has Nuelasta patch in place and reports tenderness in this area. Will continue to work on improving bilateral shoulder ROM.     Rehab Potential  Excellent    Clinical Impairments Affecting Rehab Potential  None    PT Frequency  2x / week    PT Duration  4 weeks    PT Treatment/Interventions  Patient/family education;Therapeutic exercise;ADLs/Self Care Home Management;Therapeutic activities;Manual techniques;Manual lymph drainage;Scar mobilization;Passive range of motion    PT Next Visit Plan  assess effect of tg soft Cont pulleys, ROM exercises, PROM bilateral shoulders to pt tolerance; cont manual therapy, especially for cording at Rt axilla and review HEP    PT Home Exercise Plan  Supine cane exercises and scapular series    Consulted and Agree with Plan of Care  Patient       Patient will benefit from skilled therapeutic  intervention in order to improve the following deficits and impairments:  Postural dysfunction, Decreased knowledge of precautions, Pain, Impaired UE functional use, Decreased range of motion  Visit Diagnosis: Abnormal posture  Stiffness of right shoulder, not elsewhere classified  Stiffness of left shoulder, not elsewhere classified  Problem List Patient Active Problem List   Diagnosis Date Noted  . Cancer of central portion of right female breast ([place]) 01/28/2017  . 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, estrogen receptor positive ([place]) 01/08/2017    Allyson Sabal [facility] 04/25/2017, 5:19 PM  [place], [place], [address] Phone: [phone]   Fax:  [phone]  Name: Angelica Floyd MRN: [id number] Date of Birth: [birth year]  Manus Gunning, PT 04/25/17 5:19 PM

## 2017-04-29 ENCOUNTER — Encounter: Payer: Self-pay | Admitting: Physical Therapy

## 2017-04-29 ENCOUNTER — Ambulatory Visit: Payer: PRIVATE HEALTH INSURANCE | Admitting: Physical Therapy

## 2017-04-29 ENCOUNTER — Telehealth: Payer: Self-pay | Admitting: Hematology and Oncology

## 2017-04-29 ENCOUNTER — Telehealth: Payer: Self-pay

## 2017-04-29 DIAGNOSIS — M25611 Stiffness of right shoulder, not elsewhere classified: Secondary | ICD-10-CM

## 2017-04-29 DIAGNOSIS — R293 Abnormal posture: Secondary | ICD-10-CM

## 2017-04-29 DIAGNOSIS — M25612 Stiffness of left shoulder, not elsewhere classified: Secondary | ICD-10-CM

## 2017-04-29 NOTE — Therapy (Signed)
[place], [place], [address] Phone: [phone]   Fax:  [phone]  Physical Therapy Treatment  Patient Details  Name: Angelica Floyd MRN: [id number] Date of Birth: 11-05-1965 Referring Provider: Dr. Nicholas Lose   Encounter Date: 04/29/2017  PT End of Session - 04/29/17 1519    Visit Number  10    Number of Visits  16    Date for PT Re-Evaluation  05/24/17    PT Start Time  1436    PT Stop Time  1517    PT Time Calculation (min)  41 min    Activity Tolerance  Patient tolerated treatment well    Behavior During Therapy  [facility] for tasks assessed/performed       Past Medical History:  Diagnosis Date  . Anxiety    since breast cancer  . 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, estrogen receptor positive ([place]) 01/08/2017    Past Surgical History:  Procedure Laterality Date  . ABDOMINAL HYSTERECTOMY    . ADENOIDECTOMY W/ MYRINGOTOMY    . APPENDECTOMY    . AXILLARY LYMPH NODE DISSECTION Right 02/14/2017   Procedure: RIGHT AXILLARY LYMPH NODE DISSECTION ERAS PATHWAY;  Surgeon: Jovita Kussmaul, MD;  Location: [place];  Service: General;  Laterality: Right;  . CESAREAN SECTION    . MASTECTOMY W/ SENTINEL NODE BIOPSY Bilateral 01/28/2017   RIGHT BREAST BIOPSY  . MASTECTOMY W/ SENTINEL NODE BIOPSY Bilateral 01/28/2017   Procedure: BILATERAL MASTECTOMY WITH RIGHT SENTINEL LYMPH NODE BIOPSY;  Surgeon: Jovita Kussmaul, MD;  Location: [place];  Service: General;  Laterality: Bilateral;  . PORTACATH PLACEMENT Left 02/14/2017   Procedure: INSERTION PORT-A-CATH;  Surgeon: Jovita Kussmaul, MD;  Location: [place];  Service: General;  Laterality: Left;  . RE-EXCISION OF BREAST CANCER,SUPERIOR MARGINS N/A 02/14/2017   Procedure: RE-EXCISION INFERIOR FLAP;  Surgeon: Jovita Kussmaul, MD;  Location: [facility];  Service: General;  Laterality: N/A;  . WISDOM TOOTH EXTRACTION      There were no vitals  filed for this visit.  Subjective Assessment - 04/29/17 1439    Subjective  My cold is better than it was. I think one of the big cords has gone away but now I notice some of the deeper ones more.     Pertinent History  Bilateral mastectomy for right hormone receptive breast cancer with right ALNB 01/28/17 followed by re-excision and ALND 02/14/17. Undergoing chemotherapy.    Patient Stated Goals  Improve arm function; prevent lymphedema    Currently in Pain?  No/denies         [facility] PT Assessment - 04/29/17 0001      AROM   Right Shoulder Flexion  134 Degrees    Right Shoulder ABduction  122 Degrees    Left Shoulder Flexion  150 Degrees    Left Shoulder ABduction  141 Degrees                  [facility] Adult PT Treatment/Exercise - 04/29/17 0001      Shoulder Exercises: Pulleys   Flexion  2 minutes    ABduction  2 minutes      Shoulder Exercises: Therapy Ball   Flexion  10 reps With forward lean into end of stretch    ABduction  10 reps bil UEs      Manual Therapy   Soft tissue mobilization  to cords at right axilla     Myofascial Release  very gently to cording in right axilla while performing PROM    Passive ROM  In Supine: Bil shoulders to pts tolerance into flexion, abduction and er                   Breast Clinic Goals - 01/09/17 1430      Patient will be able to verbalize understanding of pertinent lymphedema risk reduction practices relevant to her diagnosis specifically related to skin care.   Time  1    Period  Days    Status  Achieved      Patient will be able to return demonstrate and/or verbalize understanding of the post-op home exercise program related to regaining shoulder range of motion.   Time  1    Period  Days    Status  Achieved      Patient will be able to verbalize understanding of the importance of attending the postoperative After Breast Cancer Class for further lymphedema risk reduction education and therapeutic exercise.    Time  1    Period  Days    Status  Achieved       Long Term Clinic Goals - 04/17/17 1437      CC Long Term Goal  #1   Title  Patient will be independent in her home exercise program for improving shoulder ROM.    Status  Partially Met      CC Long Term Goal  #2   Title  Incease bilateral shoulder active flexion to >/= 130 degrees for increased ease reaching.    Status  Partially Met      CC Long Term Goal  #3   Title  Incease bilateral shoulder active abduction to >/= 130 degrees for increased ease reaching.    Status  Partially Met      CC Long Term Goal  #4   Title  Patient will report she has returned to running without increased edema in her right lateral trunk.    Baseline  Did run a half of the Thanksgiving [place] trot, but hasn't run since last chemo due to cold.    Status  Partially Met      CC Long Term Goal  #5   Title  Patient will verbalize understanding of lymphedema risk reduction practices.    Status  Achieved         Plan - 04/29/17 1519    Clinical Impression Statement  Pt demonstrates improvement with bilateral shoulder abduction and flexion per ROM measurements taken today. She still has cording in right axilla but it does seem to be improving. Educated pt to hold her AAROM stretches longer at home for more of a stretch. Continued with PROM to bilateral shoulders and gentle myofascial to cording in right axilla.     Rehab Potential  Excellent    Clinical Impairments Affecting Rehab Potential  None    PT Frequency  2x / week    PT Duration  4 weeks    PT Treatment/Interventions  Patient/family education;Therapeutic exercise;ADLs/Self Care Home Management;Therapeutic activities;Manual techniques;Manual lymph drainage;Scar mobilization;Passive range of motion    PT Next Visit Plan  assess effect of tg soft Cont pulleys, ROM exercises, PROM bilateral shoulders to pt tolerance; cont manual therapy, especially for cording at Rt axilla and review HEP    PT Home  Exercise Plan  Supine cane exercises and scapular series    Consulted and Agree with Plan of Care  Patient       Patient  will benefit from skilled therapeutic intervention in order to improve the following deficits and impairments:  Postural dysfunction, Decreased knowledge of precautions, Pain, Impaired UE functional use, Decreased range of motion  Visit Diagnosis: Abnormal posture  Stiffness of right shoulder, not elsewhere classified  Stiffness of left shoulder, not elsewhere classified     Problem List Patient Active Problem List   Diagnosis Date Noted  . Cancer of central portion of right female breast ([place]) 01/28/2017  . 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, estrogen receptor positive ([place]) 01/08/2017    Allyson Sabal [facility] 04/29/2017, 3:21 PM  [place], [place], [address] Phone: [phone]   Fax:  [phone]  Name: MINNETTA SANDORA MRN: [id number] Date of Birth: 07/16/1965  Manus Gunning, PT 04/29/17 3:21 PM

## 2017-04-29 NOTE — Telephone Encounter (Signed)
Pt called with head cold and wet cough. Nonproductive. No fever. She started sx sneezing Wednesday, and nasal drainage Thursday. She is asking if it is OK to use OTC cough suppressant to help with sleep at night. Gave her permission. Instructed her to call if she gets a fever, gets worse sx or sputum/mucus turns colors.   Pt has AC on 21/12

## 2017-04-29 NOTE — Telephone Encounter (Signed)
No los °

## 2017-05-01 ENCOUNTER — Ambulatory Visit: Payer: PRIVATE HEALTH INSURANCE

## 2017-05-01 DIAGNOSIS — M25612 Stiffness of left shoulder, not elsewhere classified: Secondary | ICD-10-CM

## 2017-05-01 DIAGNOSIS — M25611 Stiffness of right shoulder, not elsewhere classified: Secondary | ICD-10-CM

## 2017-05-01 DIAGNOSIS — R293 Abnormal posture: Secondary | ICD-10-CM

## 2017-05-01 DIAGNOSIS — Z483 Aftercare following surgery for neoplasm: Secondary | ICD-10-CM

## 2017-05-01 NOTE — Therapy (Signed)
[place], [place], [address] Phone: [phone]   Fax:  [phone]  Physical Therapy Treatment  Patient Details  Name: Angelica Floyd MRN: [id number] Date of Birth: [birth year] Referring Provider: Dr. Nicholas Lose   Encounter Date: 05/01/2017  PT End of Session - 05/01/17 1344    Visit Number  11    Number of Visits  16    Date for PT Re-Evaluation  05/24/17    PT Start Time  1306    PT Stop Time  7564    PT Time Calculation (min)  41 min    Activity Tolerance  Patient tolerated treatment well    Behavior During Therapy  [facility] for tasks assessed/performed       Past Medical History:  Diagnosis Date  . Anxiety    since breast cancer  . 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, estrogen receptor positive ([place]) 01/08/2017    Past Surgical History:  Procedure Laterality Date  . ABDOMINAL HYSTERECTOMY    . ADENOIDECTOMY W/ MYRINGOTOMY    . APPENDECTOMY    . AXILLARY LYMPH NODE DISSECTION Right 02/14/2017   Procedure: RIGHT AXILLARY LYMPH NODE DISSECTION ERAS PATHWAY;  Surgeon: Jovita Kussmaul, MD;  Location: [place];  Service: General;  Laterality: Right;  . CESAREAN SECTION    . MASTECTOMY W/ SENTINEL NODE BIOPSY Bilateral 01/28/2017   RIGHT BREAST BIOPSY  . MASTECTOMY W/ SENTINEL NODE BIOPSY Bilateral 01/28/2017   Procedure: BILATERAL MASTECTOMY WITH RIGHT SENTINEL LYMPH NODE BIOPSY;  Surgeon: Jovita Kussmaul, MD;  Location: [place];  Service: General;  Laterality: Bilateral;  . PORTACATH PLACEMENT Left 02/14/2017   Procedure: INSERTION PORT-A-CATH;  Surgeon: Jovita Kussmaul, MD;  Location: [place];  Service: General;  Laterality: Left;  . RE-EXCISION OF BREAST CANCER,SUPERIOR MARGINS N/A 02/14/2017   Procedure: RE-EXCISION INFERIOR FLAP;  Surgeon: Jovita Kussmaul, MD;  Location: [place];  Service: General;  Laterality: N/A;  . WISDOM TOOTH EXTRACTION      There were no vitals  filed for this visit.  Subjective Assessment - 05/01/17 1308    Subjective  My ROM is definitely better. If we can get rid of the last few deeper cords I think I'd be good. My cold is still lingering but doing better.     Pertinent History  Bilateral mastectomy for right hormone receptive breast cancer with right ALNB 01/28/17 followed by re-excision and ALND 02/14/17. Undergoing chemotherapy.    Patient Stated Goals  Improve arm function; prevent lymphedema    Currently in Pain?  No/denies                      [facility] Adult PT Treatment/Exercise - 05/01/17 0001      Shoulder Exercises: Pulleys   Flexion  2 minutes    ABduction  2 minutes      Shoulder Exercises: Therapy Ball   Flexion  10 reps With forward lean into end of stretch    ABduction  5 reps Bil UE's with side lean into end of stretch      Manual Therapy   Soft tissue mobilization  to cords at right axilla     Myofascial Release  very gently to cording in right axilla while performing PROM    Passive ROM  In Supine: Rt shoulder to pts tolerance into flexion, abduction, er and D2  Breast Clinic Goals - 01/09/17 1430      Patient will be able to verbalize understanding of pertinent lymphedema risk reduction practices relevant to her diagnosis specifically related to skin care.   Time  1    Period  Days    Status  Achieved      Patient will be able to return demonstrate and/or verbalize understanding of the post-op home exercise program related to regaining shoulder range of motion.   Time  1    Period  Days    Status  Achieved      Patient will be able to verbalize understanding of the importance of attending the postoperative After Breast Cancer Class for further lymphedema risk reduction education and therapeutic exercise.   Time  1    Period  Days    Status  Achieved       Long Term Clinic Goals - 04/17/17 1437      CC Long Term Goal  #1   Title  Patient will be  independent in her home exercise program for improving shoulder ROM.    Status  Partially Met      CC Long Term Goal  #2   Title  Incease bilateral shoulder active flexion to >/= 130 degrees for increased ease reaching.    Status  Partially Met      CC Long Term Goal  #3   Title  Incease bilateral shoulder active abduction to >/= 130 degrees for increased ease reaching.    Status  Partially Met      CC Long Term Goal  #4   Title  Patient will report she has returned to running without increased edema in her right lateral trunk.    Baseline  Did run a half of the Thanksgiving [place] trot, but hasn't run since last chemo due to cold.    Status  Partially Met      CC Long Term Goal  #5   Title  Patient will verbalize understanding of lymphedema risk reduction practices.    Status  Achieved         Plan - 05/01/17 1344    Clinical Impression Statement  Pt continues to report noted benefit of treatment with decreasing symptoms of cording in Rt axilla though still palpable and limiting. Continued with P/ROM focusing on Rt shoulder and axillary myofascial release. Pt has been wearing TG soft and reports feling this has been helping cording symptoms as well. Her new sleeve has not come in yet.     Rehab Potential  Excellent    Clinical Impairments Affecting Rehab Potential  None    PT Frequency  2x / week    PT Duration  4 weeks    PT Treatment/Interventions  Patient/family education;Therapeutic exercise;ADLs/Self Care Home Management;Therapeutic activities;Manual techniques;Manual lymph drainage;Scar mobilization;Passive range of motion    PT Next Visit Plan  Cont pulleys, ROM exercises, PROM bilateral shoulders to pt tolerance; cont manual therapy, especially for cording at Rt axilla and review HEP    Consulted and Agree with Plan of Care  Patient       Patient will benefit from skilled therapeutic intervention in order to improve the following deficits and impairments:  Postural  dysfunction, Decreased knowledge of precautions, Pain, Impaired UE functional use, Decreased range of motion  Visit Diagnosis: Abnormal posture  Stiffness of right shoulder, not elsewhere classified  Stiffness of left shoulder, not elsewhere classified  Aftercare following surgery for neoplasm     Problem List Patient  Active Problem List   Diagnosis Date Noted  . Cancer of central portion of right female breast ([place]) 01/28/2017  . 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, estrogen receptor positive ([place]) 01/08/2017    Otelia Limes, PTA 05/01/2017, 1:50 PM  [place], [place], [address] Phone: [phone]   Fax:  [phone]  Name: SHARIE AMORIN MRN: [id number] Date of Birth: 1965-06-09

## 2017-05-06 ENCOUNTER — Ambulatory Visit: Payer: PRIVATE HEALTH INSURANCE

## 2017-05-06 DIAGNOSIS — R293 Abnormal posture: Secondary | ICD-10-CM

## 2017-05-06 DIAGNOSIS — M25611 Stiffness of right shoulder, not elsewhere classified: Secondary | ICD-10-CM

## 2017-05-06 DIAGNOSIS — Z483 Aftercare following surgery for neoplasm: Secondary | ICD-10-CM

## 2017-05-06 DIAGNOSIS — M25612 Stiffness of left shoulder, not elsewhere classified: Secondary | ICD-10-CM

## 2017-05-06 NOTE — Patient Instructions (Signed)
CHEST: Doorway, Bilateral - Standing    Standing in doorway with one foot in front of other, place hands on wall with elbows bent at shoulder height. Lean forward. Hold _20-30__ seconds. _3-5__ reps per set, __2-3_ sets per day.     Lay on floor over towel roll along spine between shoulder blades:  1. Open arms wide across chest (like with band 2. Open arms up into a "V"  10 times each  Cancer Rehab [phone]

## 2017-05-06 NOTE — Therapy (Signed)
[place], [place], [address] Phone: [phone]   Fax:  [phone]  Physical Therapy Treatment  Patient Details  Name: Angelica Floyd MRN: [id number] Date of Birth: 12/03/1965 Referring Provider: Dr. Nicholas Lose   Encounter Date: 05/06/2017  PT End of Session - 05/06/17 0811    Visit Number  12    Number of Visits  16    Date for PT Re-Evaluation  05/24/17    PT Start Time  0805    PT Stop Time  0848    PT Time Calculation (min)  43 min    Activity Tolerance  Patient tolerated treatment well    Behavior During Therapy  [facility] for tasks assessed/performed       Past Medical History:  Diagnosis Date  . Anxiety    since breast cancer  . 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, estrogen receptor positive ([place]) 01/08/2017    Past Surgical History:  Procedure Laterality Date  . ABDOMINAL HYSTERECTOMY    . ADENOIDECTOMY W/ MYRINGOTOMY    . APPENDECTOMY    . AXILLARY LYMPH NODE DISSECTION Right 02/14/2017   Procedure: RIGHT AXILLARY LYMPH NODE DISSECTION ERAS PATHWAY;  Surgeon: Jovita Kussmaul, MD;  Location: [place];  Service: General;  Laterality: Right;  . CESAREAN SECTION    . MASTECTOMY W/ SENTINEL NODE BIOPSY Bilateral 01/28/2017   RIGHT BREAST BIOPSY  . MASTECTOMY W/ SENTINEL NODE BIOPSY Bilateral 01/28/2017   Procedure: BILATERAL MASTECTOMY WITH RIGHT SENTINEL LYMPH NODE BIOPSY;  Surgeon: Jovita Kussmaul, MD;  Location: [place];  Service: General;  Laterality: Bilateral;  . PORTACATH PLACEMENT Left 02/14/2017   Procedure: INSERTION PORT-A-CATH;  Surgeon: Jovita Kussmaul, MD;  Location: [place];  Service: General;  Laterality: Left;  . RE-EXCISION OF BREAST CANCER,SUPERIOR MARGINS N/A 02/14/2017   Procedure: RE-EXCISION INFERIOR FLAP;  Surgeon: Jovita Kussmaul, MD;  Location: [place];  Service: General;  Laterality: N/A;  . WISDOM TOOTH EXTRACTION      There were no vitals  filed for this visit.  Subjective Assessment - 05/06/17 0807    Subjective  Cords feel about the same but I was really sore after lsat visit, could tel e stretched good.     Pertinent History  Bilateral mastectomy for right hormone receptive breast cancer with right ALNB 01/28/17 followed by re-excision and ALND 02/14/17. Undergoing chemotherapy.    Patient Stated Goals  Improve arm function; prevent lymphedema    Currently in Pain?  No/denies                      [facility] Adult PT Treatment/Exercise - 05/06/17 0001      Shoulder Exercises: Pulleys   Flexion  2 minutes    ABduction  2 minutes      Shoulder Exercises: Therapy Ball   Flexion  10 reps With forward lean into end of stretch    ABduction  5 reps Bil UE's with side lean into end of stretch      Manual Therapy   Soft tissue mobilization  to cords at right axilla     Myofascial Release  very gently to cording in right axilla while performing PROM    Passive ROM  In Supine: Rt shoulder to pts tolerance into flexion, abduction, er and D2             PT Education - 05/06/17 0848    Education provided  Yes    Education Details  Pectoralis stretching    Person(s) Educated  Patient    Methods  Explanation;Demonstration;Handout    Comprehension  Verbalized understanding;Returned demonstration;Need further instruction           Breast Clinic Goals - 01/09/17 1430      Patient will be able to verbalize understanding of pertinent lymphedema risk reduction practices relevant to her diagnosis specifically related to skin care.   Time  1    Period  Days    Status  Achieved      Patient will be able to return demonstrate and/or verbalize understanding of the post-op home exercise program related to regaining shoulder range of motion.   Time  1    Period  Days    Status  Achieved      Patient will be able to verbalize understanding of the importance of attending the postoperative After Breast Cancer Class  for further lymphedema risk reduction education and therapeutic exercise.   Time  1    Period  Days    Status  Achieved       Long Term Clinic Goals - 04/17/17 1437      CC Long Term Goal  #1   Title  Patient will be independent in her home exercise program for improving shoulder ROM.    Status  Partially Met      CC Long Term Goal  #2   Title  Incease bilateral shoulder active flexion to >/= 130 degrees for increased ease reaching.    Status  Partially Met      CC Long Term Goal  #3   Title  Incease bilateral shoulder active abduction to >/= 130 degrees for increased ease reaching.    Status  Partially Met      CC Long Term Goal  #4   Title  Patient will report she has returned to running without increased edema in her right lateral trunk.    Baseline  Did run a half of the Thanksgiving [place] trot, but hasn't run since last chemo due to cold.    Status  Partially Met      CC Long Term Goal  #5   Title  Patient will verbalize understanding of lymphedema risk reduction practices.    Status  Achieved         Plan - 05/06/17 4098    Clinical Impression Statement  Pt tolerates stretching well and is slowly regaining end ROM with manual therapy. She reports noticing improvement again by end of session.     Rehab Potential  Excellent    Clinical Impairments Affecting Rehab Potential  None    PT Frequency  2x / week    PT Duration  4 weeks    PT Treatment/Interventions  Patient/family education;Therapeutic exercise;ADLs/Self Care Home Management;Therapeutic activities;Manual techniques;Manual lymph drainage;Scar mobilization;Passive range of motion    PT Next Visit Plan  Measure ROM; Cont pulleys, ROM exercises, PROM bilateral shoulders to pt tolerance; cont manual therapy, especially for cording at Rt axilla and review HEP    Consulted and Agree with Plan of Care  Patient       Patient will benefit from skilled therapeutic intervention in order to improve the following  deficits and impairments:  Postural dysfunction, Decreased knowledge of precautions, Pain, Impaired UE functional use, Decreased range of motion  Visit Diagnosis: Abnormal posture  Stiffness of right shoulder, not elsewhere classified  Stiffness of left shoulder, not elsewhere classified  Aftercare following surgery  for neoplasm     Problem List Patient Active Problem List   Diagnosis Date Noted  . Cancer of central portion of right female breast ([place]) 01/28/2017  . 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

## 2017-05-08 ENCOUNTER — Encounter: Payer: Self-pay | Admitting: Physical Therapy

## 2017-05-08 ENCOUNTER — Ambulatory Visit: Payer: PRIVATE HEALTH INSURANCE | Admitting: Physical Therapy

## 2017-05-08 DIAGNOSIS — M25612 Stiffness of left shoulder, not elsewhere classified: Secondary | ICD-10-CM

## 2017-05-08 DIAGNOSIS — M25611 Stiffness of right shoulder, not elsewhere classified: Secondary | ICD-10-CM

## 2017-05-08 DIAGNOSIS — R293 Abnormal posture: Secondary | ICD-10-CM

## 2017-05-08 NOTE — Therapy (Signed)
[place], [place], [address] Phone: [phone]   Fax:  [phone]  Physical Therapy Treatment  Patient Details  Name: Angelica Floyd MRN: [id number] Date of Birth: [birth year] Referring Provider: Dr. Nicholas Lose   Encounter Date: 05/08/2017  PT End of Session - 05/08/17 1219    Visit Number  13    Number of Visits  16    Date for PT Re-Evaluation  05/24/17    PT Start Time  0930    PT Stop Time  1015    PT Time Calculation (min)  45 min    Activity Tolerance  Patient tolerated treatment well    Behavior During Therapy  [facility] for tasks assessed/performed       Past Medical History:  Diagnosis Date  . Anxiety    since breast cancer  . 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, estrogen receptor positive ([place]) 01/08/2017    Past Surgical History:  Procedure Laterality Date  . ABDOMINAL HYSTERECTOMY    . ADENOIDECTOMY W/ MYRINGOTOMY    . APPENDECTOMY    . AXILLARY LYMPH NODE DISSECTION Right 02/14/2017   Procedure: RIGHT AXILLARY LYMPH NODE DISSECTION ERAS PATHWAY;  Surgeon: Jovita Kussmaul, MD;  Location: [place];  Service: General;  Laterality: Right;  . CESAREAN SECTION    . MASTECTOMY W/ SENTINEL NODE BIOPSY Bilateral 01/28/2017   RIGHT BREAST BIOPSY  . MASTECTOMY W/ SENTINEL NODE BIOPSY Bilateral 01/28/2017   Procedure: BILATERAL MASTECTOMY WITH RIGHT SENTINEL LYMPH NODE BIOPSY;  Surgeon: Jovita Kussmaul, MD;  Location: [place];  Service: General;  Laterality: Bilateral;  . PORTACATH PLACEMENT Left 02/14/2017   Procedure: INSERTION PORT-A-CATH;  Surgeon: Jovita Kussmaul, MD;  Location: [place];  Service: General;  Laterality: Left;  . RE-EXCISION OF BREAST CANCER,SUPERIOR MARGINS N/A 02/14/2017   Procedure: RE-EXCISION INFERIOR FLAP;  Surgeon: Jovita Kussmaul, MD;  Location: [place];  Service: General;  Laterality: N/A;  . WISDOM TOOTH EXTRACTION      There were no vitals  filed for this visit.  Subjective Assessment - 05/08/17 0949    Subjective  Pt states she is feeling good feels like the cords are improving.  She is asking about starting light weights.  She has her sleeve ordered from [facility].     Pertinent History  Bilateral mastectomy for right hormone receptive breast cancer with right ALNB 01/28/17 followed by re-excision and ALND 02/14/17. Undergoing chemotherapy.    Patient Stated Goals  Improve arm function; prevent lymphedema    Currently in Pain?  No/denies                      [facility] Adult PT Treatment/Exercise - 05/08/17 0001      Shoulder Exercises: Supine   Other Supine Exercises  lower trunk rotation with goal post arms     Other Supine Exercises  yellow ball at thoracic spine for anterior chest stretch and also under sacrum for pelviic tilts and rotation to stretch core fascia       Shoulder Exercises: Sidelying   ABduction  AROM;Right;5 reps    Other Sidelying Exercises  small circles with hand pointed to ceiling     Other Sidelying Exercises  horizontal abduction with posterio thoraic rotation  pt limited by pulling in chest       Shoulder Exercises: Pulleys   Flexion  2 minutes    ABduction  2 minutes  Shoulder Exercises: Therapy Ball   Flexion  10 reps With forward lean into end of stretch    ABduction  5 reps Bil UE's with side lean into end of stretch      Shoulder Exercises: ROM/Strengthening   Other ROM/Strengthening Exercises  modified downward dog       Manual Therapy   Manual Therapy  Manual Lymphatic Drainage (MLD);Taping    Soft tissue mobilization  to cords at right axilla     Myofascial Release  very gently to cording in right axilla while performing PROM    Manual Lymphatic Drainage (MLD)  in left sidelying for right lateral trunk, anterior chest and axilla     Passive ROM  In Supine: Rt shoulder to pts tolerance into flexion, abduction, er and D2      Kinesiotix   Edema  skin prep then  tape in fan shape along right lateral side from axilla to lower abdomen                    Breast Clinic Goals - 01/09/17 1430      Patient will be able to verbalize understanding of pertinent lymphedema risk reduction practices relevant to her diagnosis specifically related to skin care.   Time  1    Period  Days    Status  Achieved      Patient will be able to return demonstrate and/or verbalize understanding of the post-op home exercise program related to regaining shoulder range of motion.   Time  1    Period  Days    Status  Achieved      Patient will be able to verbalize understanding of the importance of attending the postoperative After Breast Cancer Class for further lymphedema risk reduction education and therapeutic exercise.   Time  1    Period  Days    Status  Achieved       Long Term Clinic Goals - 04/17/17 1437      CC Long Term Goal  #1   Title  Patient will be independent in her home exercise program for improving shoulder ROM.    Status  Partially Met      CC Long Term Goal  #2   Title  Incease bilateral shoulder active flexion to >/= 130 degrees for increased ease reaching.    Status  Partially Met      CC Long Term Goal  #3   Title  Incease bilateral shoulder active abduction to >/= 130 degrees for increased ease reaching.    Status  Partially Met      CC Long Term Goal  #4   Title  Patient will report she has returned to running without increased edema in her right lateral trunk.    Baseline  Did run a half of the Thanksgiving [place] trot, but hasn't run since last chemo due to cold.    Status  Partially Met      CC Long Term Goal  #5   Title  Patient will verbalize understanding of lymphedema risk reduction practices.    Status  Achieved         Plan - 05/08/17 1220    Clinical Impression Statement  Pt is doing very well with arm ROM and is making progress toward decrease in cording.  Added kinesiotape trial today to see if it will  help with swelling inlateral chest and and possilby cording.  She is ready to learn Strength ABC program that she  can continue with light weights through next chemo session . Brief introduction to program and weblink for lymphedema eduction session given today     Clinical Impairments Affecting Rehab Potential  None    PT Duration  4 weeks    PT Treatment/Interventions  Patient/family education;Therapeutic exercise;ADLs/Self Care Home Management;Therapeutic activities;Manual techniques;Manual lymph drainage;Scar mobilization;Passive range of motion    PT Next Visit Plan  Measure ROM for goals, Assess effect of kinesiotape.  Begin teaching Strength ABC program. Assess frequency Pt may deacrease to one time a week duing next chemo     Consulted and Agree with Plan of Care  Patient       Patient will benefit from skilled therapeutic intervention in order to improve the following deficits and impairments:  Postural dysfunction, Decreased knowledge of precautions, Pain, Impaired UE functional use, Decreased range of motion  Visit Diagnosis: Abnormal posture  Stiffness of right shoulder, not elsewhere classified  Stiffness of left shoulder, not elsewhere classified     Problem List Patient Active Problem List   Diagnosis Date Noted  . Cancer of central portion of right female breast ([place]) 01/28/2017  . 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, estrogen receptor positive ([place]) 01/08/2017   Donato Heinz. Owens Shark PT  Norwood Levo 05/08/2017, 12:26 PM  [place], [place], [address] Phone: [phone]   Fax:  [phone]  Name: Angelica Floyd MRN: [id number] Date of Birth: 1965-12-06

## 2017-05-08 NOTE — Patient Instructions (Signed)
[email or web address] Courses Online Strength After Breast Cancer Look at the right of the page for Lymphedema Education Session  

## 2017-05-09 ENCOUNTER — Other Ambulatory Visit (HOSPITAL_BASED_OUTPATIENT_CLINIC_OR_DEPARTMENT_OTHER): Payer: PRIVATE HEALTH INSURANCE

## 2017-05-09 ENCOUNTER — Ambulatory Visit: Payer: PRIVATE HEALTH INSURANCE

## 2017-05-09 ENCOUNTER — Telehealth: Payer: Self-pay | Admitting: Hematology and Oncology

## 2017-05-09 ENCOUNTER — Ambulatory Visit (HOSPITAL_BASED_OUTPATIENT_CLINIC_OR_DEPARTMENT_OTHER): Payer: PRIVATE HEALTH INSURANCE | Admitting: Hematology and Oncology

## 2017-05-09 ENCOUNTER — Ambulatory Visit (HOSPITAL_BASED_OUTPATIENT_CLINIC_OR_DEPARTMENT_OTHER): Payer: PRIVATE HEALTH INSURANCE

## 2017-05-09 VITALS — BP 106/76 | HR 104 | Temp 98.9°F | Resp 18

## 2017-05-09 DIAGNOSIS — C773 Secondary and unspecified malignant neoplasm of axilla and upper limb lymph nodes: Secondary | ICD-10-CM

## 2017-05-09 DIAGNOSIS — C50511 Malignant neoplasm of lower-outer quadrant of right female breast: Secondary | ICD-10-CM

## 2017-05-09 DIAGNOSIS — Z95828 Presence of other vascular implants and grafts: Secondary | ICD-10-CM

## 2017-05-09 DIAGNOSIS — Z170 Estrogen receptor positive status [ER+]: Principal | ICD-10-CM

## 2017-05-09 DIAGNOSIS — Z5111 Encounter for antineoplastic chemotherapy: Secondary | ICD-10-CM

## 2017-05-09 LAB — COMPREHENSIVE METABOLIC PANEL
ALBUMIN: 4 g/dL (ref 3.5–5.0)
ALK PHOS: 79 U/L (ref 40–150)
ALT: 31 U/L (ref 0–55)
AST: 31 U/L (ref 5–34)
Anion Gap: 10 mEq/L (ref 3–11)
BILIRUBIN TOTAL: 0.26 mg/dL (ref 0.20–1.20)
BUN: 10.3 mg/dL (ref 7.0–26.0)
CALCIUM: 9.5 mg/dL (ref 8.4–10.4)
CO2: 25 mEq/L (ref 22–29)
Chloride: 104 mEq/L (ref 98–109)
Creatinine: 0.8 mg/dL (ref 0.6–1.1)
EGFR: >60 mL/min/{1.73_m2} (ref >60)
GLUCOSE: 106 mg/dL (ref 70–140)
Potassium: 3.7 mEq/L (ref 3.5–5.1)
SODIUM: 139 meq/L (ref 136–145)
TOTAL PROTEIN: 6.9 g/dL (ref 6.4–8.3)

## 2017-05-09 LAB — CBC WITH DIFFERENTIAL/PLATELET
BASO%: 0.7 % (ref 0.0–2.0)
Basophils Absolute: 0.1 10*3/uL (ref 0.0–0.1)
EOS%: 0.4 % (ref 0.0–7.0)
Eosinophils Absolute: 0 10*3/uL (ref 0.0–0.5)
HCT: 33.3 % — ABNORMAL LOW (ref 34.8–46.6)
HGB: 11.2 g/dL — ABNORMAL LOW (ref 11.6–15.9)
LYMPH%: 8.6 % — AB (ref 14.0–49.7)
MCH: 31.4 pg (ref 25.1–34.0)
MCHC: 33.7 g/dL (ref 31.5–36.0)
MCV: 93 fL (ref 79.5–101.0)
MONO#: 1 10*3/uL — ABNORMAL HIGH (ref 0.1–0.9)
MONO%: 11.7 % (ref 0.0–14.0)
NEUT%: 78.6 % — AB (ref 38.4–76.8)
NEUTROS ABS: 6.7 10*3/uL — AB (ref 1.5–6.5)
Platelets: 319 10*3/uL (ref 145–400)
RBC: 3.58 10*6/uL — AB (ref 3.70–5.45)
RDW: 18.5 % — ABNORMAL HIGH (ref 11.2–14.5)
WBC: 8.5 10*3/uL (ref 3.9–10.3)
lymph#: 0.7 10*3/uL — ABNORMAL LOW (ref 0.9–3.3)

## 2017-05-09 MED ORDER — DEXAMETHASONE SODIUM PHOSPHATE 10 MG/ML IJ SOLN
INTRAMUSCULAR | Status: AC
  Filled 2017-05-09: qty 1

## 2017-05-09 MED ORDER — DEXAMETHASONE SODIUM PHOSPHATE 10 MG/ML IJ SOLN
10.0000 mg | Freq: Once | INTRAMUSCULAR | Status: AC
  Administered 2017-05-09: 10 mg via INTRAVENOUS

## 2017-05-09 MED ORDER — HEPARIN SOD (PORK) LOCK FLUSH 100 UNIT/ML IV SOLN
500.0000 [IU] | Freq: Once | INTRAVENOUS | Status: DC | PRN
  Filled 2017-05-09: qty 5

## 2017-05-09 MED ORDER — DIPHENHYDRAMINE HCL 50 MG/ML IJ SOLN
50.0000 mg | Freq: Once | INTRAMUSCULAR | Status: AC
  Administered 2017-05-09: 50 mg via INTRAVENOUS

## 2017-05-09 MED ORDER — PALONOSETRON HCL INJECTION 0.25 MG/5ML
0.2500 mg | Freq: Once | INTRAVENOUS | Status: AC
  Administered 2017-05-09: 0.25 mg via INTRAVENOUS

## 2017-05-09 MED ORDER — FAMOTIDINE IN NACL 20-0.9 MG/50ML-% IV SOLN
20.0000 mg | Freq: Once | INTRAVENOUS | Status: AC
  Administered 2017-05-09: 20 mg via INTRAVENOUS

## 2017-05-09 MED ORDER — SODIUM CHLORIDE 0.9% FLUSH
10.0000 mL | INTRAVENOUS | Status: DC | PRN
  Administered 2017-05-09: 10 mL via INTRAVENOUS
  Filled 2017-05-09: qty 10

## 2017-05-09 MED ORDER — PALONOSETRON HCL INJECTION 0.25 MG/5ML
INTRAVENOUS | Status: AC
  Filled 2017-05-09: qty 5

## 2017-05-09 MED ORDER — DIPHENHYDRAMINE HCL 50 MG/ML IJ SOLN
INTRAMUSCULAR | Status: AC
  Filled 2017-05-09: qty 1

## 2017-05-09 MED ORDER — FAMOTIDINE IN NACL 20-0.9 MG/50ML-% IV SOLN
INTRAVENOUS | Status: AC
  Filled 2017-05-09: qty 50

## 2017-05-09 MED ORDER — SODIUM CHLORIDE 0.9 % IV SOLN
80.0000 mg/m2 | Freq: Once | INTRAVENOUS | Status: AC
  Administered 2017-05-09: 126 mg via INTRAVENOUS
  Filled 2017-05-09: qty 21

## 2017-05-09 MED ORDER — SODIUM CHLORIDE 0.9% FLUSH
10.0000 mL | INTRAVENOUS | Status: DC | PRN
  Filled 2017-05-09: qty 10

## 2017-05-09 MED ORDER — SODIUM CHLORIDE 0.9 % IV SOLN
Freq: Once | INTRAVENOUS | Status: AC
  Administered 2017-05-09: 11:00:00 via INTRAVENOUS

## 2017-05-09 NOTE — Patient Instructions (Signed)
Paclitaxel injection What is this medicine? PACLITAXEL (PAK li TAX el) is a chemotherapy drug. It targets fast dividing cells, like cancer cells, and causes these cells to die. This medicine is used to treat ovarian cancer, breast cancer, and other cancers. This medicine may be used for other purposes; ask your health care provider or pharmacist if you have questions. COMMON BRAND NAME(S): Onxol, Taxol What should I tell my health care provider before I take this medicine? They need to know if you have any of these conditions: -blood disorders -irregular heartbeat -infection (especially a virus infection such as chickenpox, cold sores, or herpes) -liver disease -previous or ongoing radiation therapy -an unusual or allergic reaction to paclitaxel, alcohol, polyoxyethylated castor oil, other chemotherapy agents, other medicines, foods, dyes, or preservatives -pregnant or trying to get pregnant -breast-feeding How should I use this medicine? This drug is given as an infusion into a vein. It is administered in a hospital or clinic by a specially trained health care professional. Talk to your pediatrician regarding the use of this medicine in children. Special care may be needed. Overdosage: If you think you have taken too much of this medicine contact a poison control center or emergency room at once. NOTE: This medicine is only for you. Do not share this medicine with others. What if I miss a dose? It is important not to miss your dose. Call your doctor or health care professional if you are unable to keep an appointment. What may interact with this medicine? Do not take this medicine with any of the following medications: -disulfiram -metronidazole This medicine may also interact with the following medications: -cyclosporine -diazepam -ketoconazole -medicines to increase blood counts like filgrastim, pegfilgrastim, sargramostim -other chemotherapy drugs like cisplatin, doxorubicin,  epirubicin, etoposide, teniposide, vincristine -quinidine -testosterone -vaccines -verapamil Talk to your doctor or health care professional before taking any of these medicines: -acetaminophen -aspirin -ibuprofen -ketoprofen -naproxen This list may not describe all possible interactions. Give your health care provider a list of all the medicines, herbs, non-prescription drugs, or dietary supplements you use. Also tell them if you smoke, drink alcohol, or use illegal drugs. Some items may interact with your medicine. What should I watch for while using this medicine? Your condition will be monitored carefully while you are receiving this medicine. You will need important blood work done while you are taking this medicine. This medicine can cause serious allergic reactions. To reduce your risk you will need to take other medicine(s) before treatment with this medicine. If you experience allergic reactions like skin rash, itching or hives, swelling of the face, lips, or tongue, tell your doctor or health care professional right away. In some cases, you may be given additional medicines to help with side effects. Follow all directions for their use. This drug may make you feel generally unwell. This is not uncommon, as chemotherapy can affect healthy cells as well as cancer cells. Report any side effects. Continue your course of treatment even though you feel ill unless your doctor tells you to stop. Call your doctor or health care professional for advice if you get a fever, chills or sore throat, or other symptoms of a cold or flu. Do not treat yourself. This drug decreases your body's ability to fight infections. Try to avoid being around people who are sick. This medicine may increase your risk to bruise or bleed. Call your doctor or health care professional if you notice any unusual bleeding. Be careful brushing and flossing your teeth or   using a toothpick because you may get an infection or  bleed more easily. If you have any dental work done, tell your dentist you are receiving this medicine. Avoid taking products that contain aspirin, acetaminophen, ibuprofen, naproxen, or ketoprofen unless instructed by your doctor. These medicines may hide a fever. Do not become pregnant while taking this medicine. Women should inform their doctor if they wish to become pregnant or think they might be pregnant. There is a potential for serious side effects to an unborn child. Talk to your health care professional or pharmacist for more information. Do not breast-feed an infant while taking this medicine. Men are advised not to father a child while receiving this medicine. This product may contain alcohol. Ask your pharmacist or healthcare provider if this medicine contains alcohol. Be sure to tell all healthcare providers you are taking this medicine. Certain medicines, like metronidazole and disulfiram, can cause an unpleasant reaction when taken with alcohol. The reaction includes flushing, headache, nausea, vomiting, sweating, and increased thirst. The reaction can last from 30 minutes to several hours. What side effects may I notice from receiving this medicine? Side effects that you should report to your doctor or health care professional as soon as possible: -allergic reactions like skin rash, itching or hives, swelling of the face, lips, or tongue -low blood counts - This drug may decrease the number of white blood cells, red blood cells and platelets. You may be at increased risk for infections and bleeding. -signs of infection - fever or chills, cough, sore throat, pain or difficulty passing urine -signs of decreased platelets or bleeding - bruising, pinpoint red spots on the skin, black, tarry stools, nosebleeds -signs of decreased red blood cells - unusually weak or tired, fainting spells, lightheadedness -breathing problems -chest pain -high or low blood pressure -mouth sores -nausea and  vomiting -pain, swelling, redness or irritation at the injection site -pain, tingling, numbness in the hands or feet -slow or irregular heartbeat -swelling of the ankle, feet, hands Side effects that usually do not require medical attention (report to your doctor or health care professional if they continue or are bothersome): -bone pain -complete hair loss including hair on your head, underarms, pubic hair, eyebrows, and eyelashes -changes in the color of fingernails -diarrhea -loosening of the fingernails -loss of appetite -muscle or joint pain -red flush to skin -sweating This list may not describe all possible side effects. Call your doctor for medical advice about side effects. You may report side effects to FDA at 1-800-FDA-1088. Where should I keep my medicine? This drug is given in a hospital or clinic and will not be stored at home. NOTE: This sheet is a summary. It may not cover all possible information. If you have questions about this medicine, talk to your doctor, pharmacist, or health care provider.  2018 Elsevier/Gold Standard (2015-03-01 19:58:00)  

## 2017-05-09 NOTE — Progress Notes (Signed)
Patient Care Team: Jettie Booze, NP as PCP - General (Family Medicine) Nicholas Lose, MD as Consulting Physician (Hematology and Oncology) Jovita Kussmaul, MD as Consulting Physician (General Surgery) Gery Pray, MD as Consulting Physician (Radiation Oncology)  DIAGNOSIS:  Encounter Diagnosis  Name Primary?  . 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, estrogen receptor positive ([place])     SUMMARY OF ONCOLOGIC HISTORY:   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, estrogen receptor positive ([place])   01/02/2017 Initial Diagnosis    Palpable right breast mass retroareolar 6:30 position: 3.6 cm size axilla negative, 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 HER-2 negative ratio 1.31 Ki-67 3% in addition calcifications UIQ 1.4 cm stereotactic biopsy flat epithelial atypia; clips are 4.3 cm apart, T2 N0 stage IB clinical stage AJCC 8      01/28/2017 Surgery    Bilateral mastectomies: Right: Grade 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%, PR 95%, HER-2 negative ratio 1.31, Ki-67 3%, 4/4 lymph nodes positive; left mastectomy: PASH and FC changes, no malignancy; T2 N2,  stage IIA AJCC 8      02/14/2017 Surgery    Right axillary lymph node dissection 8/11 lymph nodes positive      03/06/2017 -  Chemotherapy    Dose dense AC x4 followed by Taxol x12       CHIEF COMPLIANT: Taxol cycle 1  INTERVAL HISTORY: Angelica Floyd is a 51 year old with above-mentioned history of bilateral mastectomies and is currently on adjuvant chemotherapy and today's cycle 1 of Taxol.  She is glad to be done with Adriamycin and Cytoxan.  She has had a cold and a dry cough but denies any fevers or chills.  REVIEW OF SYSTEMS:   Constitutional: Denies fevers, chills or abnormal weight loss Eyes: Denies blurriness of vision Ears, nose, mouth, throat, and face: Denies mucositis or sore throat Respiratory: Cold and dry cough Cardiovascular: Denies palpitation, chest  discomfort Gastrointestinal:  Denies nausea, heartburn or change in bowel habits Skin: Denies abnormal skin rashes Lymphatics: Denies new lymphadenopathy or easy bruising Neurological:Denies numbness, tingling or new weaknesses Behavioral/Psych: Mood is stable, no new changes  Extremities: No lower extremity edema  All other systems were reviewed with the patient and are negative.  I have reviewed the past medical history, past surgical history, social history and family history with the patient and they are unchanged from previous note.  ALLERGIES:  has No Known Allergies.  MEDICATIONS:  Current Outpatient Medications  Medication Sig Dispense Refill  . Acetaminophen (TYLENOL CHILDRENS CHEWABLES PO) Take by mouth as needed.    Marland Kitchen dexamethasone (DECADRON) 4 MG tablet Take 1 tablet (4 mg total) by mouth daily. Take 1 tablet daily after chemotherapy and one tablet 2 days after chemotherapy with food (Patient not taking: Reported on 04/08/2017) 8 tablet 0  . ketotifen (ZADITOR) 0.025 % ophthalmic solution Apply 1 drop to eye daily as needed (allergies).    Marland Kitchen lidocaine-prilocaine (EMLA) cream Apply to affected area once 30 g 3  . loratadine (CLARITIN) 10 MG tablet Take 10 mg by mouth as needed for allergies.    Marland Kitchen LORazepam (ATIVAN) 0.5 MG tablet Take 1 tablet (0.5 mg total) by mouth at bedtime. 30 tablet 0  . Multiple Vitamins-Minerals (CENTRUM ADULTS) TABS Take 1 tablet by mouth daily.     . ondansetron (ZOFRAN) 8 MG tablet Take 1 tablet (8 mg total) by mouth 2 (two) times daily as needed. Start on  the third day after chemotherapy. 30 tablet 1  . prochlorperazine (COMPAZINE) 10 MG tablet Take 1 tablet (10 mg total) by mouth every 6 (six) hours as needed (Nausea or vomiting). 30 tablet 1   No current facility-administered medications for this visit.     PHYSICAL EXAMINATION: ECOG PERFORMANCE STATUS: 1 - Symptomatic but completely ambulatory  Vitals:   05/09/17 0910  BP: 120/90  Pulse:  (!) 108  Resp: 18  Temp: 97.7 F (36.5 C)  SpO2: 100%   Filed Weights   05/09/17 0910  Weight: 116 lb 12.8 oz (53 kg)    GENERAL:alert, no distress and comfortable SKIN: skin color, texture, turgor are normal, no rashes or significant lesions EYES: normal, Conjunctiva are pink and non-injected, sclera clear OROPHARYNX:no exudate, no erythema and lips, buccal mucosa, and tongue normal  NECK: supple, thyroid normal size, non-tender, without nodularity LYMPH:  no palpable lymphadenopathy in the cervical, axillary or inguinal LUNGS: clear to auscultation and percussion with normal breathing effort HEART: regular rate & rhythm and no murmurs and no lower extremity edema ABDOMEN:abdomen soft, non-tender and normal bowel sounds MUSCULOSKELETAL:no cyanosis of digits and no clubbing  NEURO: alert & oriented x 3 with fluent speech, no focal motor/sensory deficits EXTREMITIES: No lower extremity edema  LABORATORY DATA:  I have reviewed the data as listed   Chemistry      Component Value Date/Time   NA 139 05/09/2017 0839   K 3.7 05/09/2017 0839   CL 105 02/14/2017 0747   CO2 25 05/09/2017 0839   BUN 10.3 05/09/2017 0839   CREATININE 0.8 05/09/2017 0839      Component Value Date/Time   CALCIUM 9.5 05/09/2017 0839   ALKPHOS 79 05/09/2017 0839   AST 31 05/09/2017 0839   ALT 31 05/09/2017 0839   BILITOT 0.26 05/09/2017 0839       Lab Results  Component Value Date   WBC 8.5 05/09/2017   HGB 11.2 (L) 05/09/2017   HCT 33.3 (L) 05/09/2017   MCV 93.0 05/09/2017   PLT 319 05/09/2017   NEUTROABS 6.7 (H) 05/09/2017    ASSESSMENT & PLAN:  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, estrogen receptor positive ([place]) 01/28/2017: Bilateral mastectomies: Right: Grade 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%, PR 95%, HER-2 negative ratio 1.31, Ki-67 3%, 4/4 lymph nodes positive; left mastectomy: PASH and FC changes, no malignancy; T2 N2, stage IIA AJCC 8 02-14-17: 8/10  lymph nodes positive  CT chest 02/13/2017: 3 mm right middle lobe nodule likely benign benign cysts in the liver, ovarian cysts few small sclerotic lesions in the bone likely bone islands Bone scan 02/13/2017: No bone metastases  Treatment plan: 1. adjuvant chemotherapy with dose dense Adriamycin and Cytoxan x4 followed by Taxol weekly x12 3. Adjuvant radiation 4. Adjuvant antiestrogen therapy with tamoxifen (which was originally started prior to surgery) ---------------------------------------------------------------------- Current treatment: Completed 4 cycles of dose dense Adriamycin Cytoxan, today cycle 1 Taxol  Chemo toxicities: 1.  Alopecia 2. Fatigue  Return to clinic in 1 week for cycle 2 of Taxol    I spent 25 minutes talking to the patient of which more than half was spent in counseling and coordination of care.  No orders of the defined types were placed in this encounter.  The patient has a good understanding of the overall plan. she agrees with it. she will call with any problems that may develop before the next visit here.   Harriette Ohara, MD 05/09/17

## 2017-05-09 NOTE — Telephone Encounter (Signed)
No 12/27 los.  

## 2017-05-09 NOTE — Assessment & Plan Note (Signed)
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 adjuvant chemotherapy with dose dense Adriamycin and Cytoxan x4 followed by Taxol weekly x12 3. Adjuvant radiation 4. Adjuvant antiestrogen therapy with tamoxifen (which was originally started prior to surgery) ---------------------------------------------------------------------- Current treatment: Completed 4 cycles of dose dense Adriamycin Cytoxan, today cycle 1 Taxol  Chemo toxicities: 1.  Alopecia 2. Fatigue  Return to clinic in 1 week for cycle 2 of Taxol

## 2017-05-13 ENCOUNTER — Ambulatory Visit: Payer: PRIVATE HEALTH INSURANCE

## 2017-05-13 DIAGNOSIS — R293 Abnormal posture: Secondary | ICD-10-CM

## 2017-05-13 DIAGNOSIS — M25612 Stiffness of left shoulder, not elsewhere classified: Secondary | ICD-10-CM

## 2017-05-13 DIAGNOSIS — M25611 Stiffness of right shoulder, not elsewhere classified: Secondary | ICD-10-CM

## 2017-05-13 DIAGNOSIS — Z483 Aftercare following surgery for neoplasm: Secondary | ICD-10-CM

## 2017-05-13 NOTE — Therapy (Signed)
[place], [place], [address] Phone: [phone]   Fax:  [phone]  Physical Therapy Treatment  Patient Details  Name: Angelica Floyd MRN: [id number] Date of Birth: 1965-10-17 Referring Provider: Dr. Nicholas Lose   Encounter Date: 05/13/2017  PT End of Session - 05/13/17 1136    Visit Number  14    Number of Visits  16    Date for PT Re-Evaluation  05/24/17    PT Start Time  1110    PT Stop Time  1156    PT Time Calculation (min)  46 min    Activity Tolerance  Patient tolerated treatment well    Behavior During Therapy  [facility] for tasks assessed/performed       Past Medical History:  Diagnosis Date  . Anxiety    since breast cancer  . 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, estrogen receptor positive ([place]) 01/08/2017    Past Surgical History:  Procedure Laterality Date  . ABDOMINAL HYSTERECTOMY    . ADENOIDECTOMY W/ MYRINGOTOMY    . APPENDECTOMY    . AXILLARY LYMPH NODE DISSECTION Right 02/14/2017   Procedure: RIGHT AXILLARY LYMPH NODE DISSECTION ERAS PATHWAY;  Surgeon: Jovita Kussmaul, MD;  Location: [place];  Service: General;  Laterality: Right;  . CESAREAN SECTION    . MASTECTOMY W/ SENTINEL NODE BIOPSY Bilateral 01/28/2017   RIGHT BREAST BIOPSY  . MASTECTOMY W/ SENTINEL NODE BIOPSY Bilateral 01/28/2017   Procedure: BILATERAL MASTECTOMY WITH RIGHT SENTINEL LYMPH NODE BIOPSY;  Surgeon: Jovita Kussmaul, MD;  Location: [place];  Service: General;  Laterality: Bilateral;  . PORTACATH PLACEMENT Left 02/14/2017   Procedure: INSERTION PORT-A-CATH;  Surgeon: Jovita Kussmaul, MD;  Location: [place];  Service: General;  Laterality: Left;  . RE-EXCISION OF BREAST CANCER,SUPERIOR MARGINS N/A 02/14/2017   Procedure: RE-EXCISION INFERIOR FLAP;  Surgeon: Jovita Kussmaul, MD;  Location: [place];  Service: General;  Laterality: N/A;  . WISDOM TOOTH EXTRACTION      There were no vitals  filed for this visit.  Subjective Assessment - 05/13/17 1117    Subjective  The kinesiotape tape seemed to help the "dog ear" area that Angelica Floyd put on me last time. I want to have that again (stayed on until yesterday). And my sleeve is in so I can pick it up when they open again after New Year.      Pertinent History  Bilateral mastectomy for right hormone receptive breast cancer with right ALNB 01/28/17 followed by re-excision and ALND 02/14/17. Undergoing chemotherapy.    Patient Stated Goals  Improve arm function; prevent lymphedema    Currently in Pain?  No/denies         [facility] PT Assessment - 05/13/17 0001      AROM   Right Shoulder Flexion  138 Degrees    Right Shoulder ABduction  140 Degrees    Left Shoulder Flexion  151 Degrees Pt feels port stopping end ROM    Left Shoulder ABduction  153 Degrees                  [facility] Adult PT Treatment/Exercise - 05/13/17 0001      Shoulder Exercises: Pulleys   Flexion  2 minutes    ABduction  2 minutes      Shoulder Exercises: Therapy Ball   Flexion  10 reps With forward lean into end of stretch    ABduction  5 reps Bil  UE with side lean into end of stretch      Shoulder Exercises: ROM/Strengthening   Other ROM/Strengthening Exercises  Strength ABC Program all stretches and strengthening exercise using 1 lb weights during strengthening portion and 10 times each.      Kinesiotix   Edema  skin prep then tape in 4 finger fan shape along right lateral side from axilla and inferior to incision to lower abdomen              PT Education - 05/13/17 1136    Education provided  Yes    Education Details  Strength ABC Program    Person(s) Educated  Patient    Methods  Explanation;Demonstration;Handout;Verbal cues    Comprehension  Verbalized understanding;Returned demonstration           Breast Clinic Goals - 01/09/17 1430      Patient will be able to verbalize understanding of pertinent lymphedema risk reduction  practices relevant to her diagnosis specifically related to skin care.   Time  1    Period  Days    Status  Achieved      Patient will be able to return demonstrate and/or verbalize understanding of the post-op home exercise program related to regaining shoulder range of motion.   Time  1    Period  Days    Status  Achieved      Patient will be able to verbalize understanding of the importance of attending the postoperative After Breast Cancer Class for further lymphedema risk reduction education and therapeutic exercise.   Time  1    Period  Days    Status  Achieved       Long Term Clinic Goals - 05/13/17 1214      CC Long Term Goal  #1   Title  Patient will be independent in her home exercise program for improving shoulder ROM.    Baseline  Progressed HEP to include Strength ABC Program today-05/13/17    Status  Partially Met      CC Long Term Goal  #2   Title  Incease bilateral shoulder active flexion to >/= 130 degrees for increased ease reaching.    Baseline  Rt 110 degrees (cording very limiting) and Lt 133 degrees=04/02/17; Rt 138 and Lt 151 degrees-05/13/17    Status  Achieved      CC Long Term Goal  #3   Title  Incease bilateral shoulder active abduction to >/= 130 degrees for increased ease reaching.    Baseline  Rt 103 degrees (cording very limiting) and Lt 134 degrees-04/02/17; Rt 140 and Lt 153 degrees-05/13/17    Status  Achieved      CC Long Term Goal  #4   Title  Patient will report she has returned to running without increased edema in her right lateral trunk.    Baseline  Did run a half of the Thanksgiving [place] trot, but hasn't run since last chemo due to cold; Pt has gone running a few times up to 3 miles and reports this feeling good-05/13/17    Status  Partially Met      CC Long Term Goal  #5   Title  Patient will verbalize understanding of lymphedema risk reduction practices.    Status  Achieved         Plan - 05/13/17 1140    Clinical  Impression Statement  Pt continues to report overall good progress with ADLs and had great improvement with her A/ROM measurements today.  Instructed pt in Strength ABC Program which she tolerated very well. Did not modify any of strength exercises as pt wa able to tolerate all well. Other than some soreness pt had no c/o pain after session today. Pt would like to decrease frequency to 1x/wk for last few sessions.      Rehab Potential  Excellent    Clinical Impairments Affecting Rehab Potential  None    PT Frequency  2x / week    PT Duration  4 weeks    PT Treatment/Interventions  Patient/family education;Therapeutic exercise;ADLs/Self Care Home Management;Therapeutic activities;Manual techniques;Manual lymph drainage;Scar mobilization;Passive range of motion    PT Next Visit Plan  Cont with kinesiotape if pt still deems beneficial. Review any of Strength ABC Program prn. Cont 1x/wk until ready for D/C.     Consulted and Agree with Plan of Care  Patient       Patient will benefit from skilled therapeutic intervention in order to improve the following deficits and impairments:  Postural dysfunction, Decreased knowledge of precautions, Pain, Impaired UE functional use, Decreased range of motion  Visit Diagnosis: Abnormal posture  Stiffness of right shoulder, not elsewhere classified  Stiffness of left shoulder, not elsewhere classified  Aftercare following surgery for neoplasm     Problem List Patient Active Problem List   Diagnosis Date Noted  . Cancer of central portion of right female breast ([place]) 01/28/2017  . 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, estrogen receptor positive ([place]) 01/08/2017    Otelia Limes, PTA 05/13/2017, 12:17 PM  [place], [place], [address] Phone: [phone]   Fax:  [phone]  Name: Angelica Floyd MRN: [id number] Date of Birth:  06-May-1966

## 2017-05-15 NOTE — Assessment & Plan Note (Signed)
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 adjuvant chemotherapy with dose dense Adriamycin and Cytoxan x4 followed by Taxol weekly x12 3. Adjuvant radiation 4. Adjuvant antiestrogen therapy with tamoxifen (which was originally started prior to surgery) ---------------------------------------------------------------------- Current treatment: Completed 4 cycles of dose dense Adriamycin Cytoxan, today cycle 2 Taxol  Chemo toxicities: 1.  Alopecia 2. Fatigue  Return to clinic in 2 weeks for cycle 4 of Taxol

## 2017-05-16 ENCOUNTER — Ambulatory Visit: Payer: BLUE CROSS/BLUE SHIELD

## 2017-05-16 ENCOUNTER — Ambulatory Visit (HOSPITAL_BASED_OUTPATIENT_CLINIC_OR_DEPARTMENT_OTHER): Payer: BLUE CROSS/BLUE SHIELD

## 2017-05-16 ENCOUNTER — Inpatient Hospital Stay: Payer: BLUE CROSS/BLUE SHIELD | Attending: Hematology and Oncology | Admitting: Hematology and Oncology

## 2017-05-16 ENCOUNTER — Other Ambulatory Visit (HOSPITAL_BASED_OUTPATIENT_CLINIC_OR_DEPARTMENT_OTHER): Payer: BLUE CROSS/BLUE SHIELD

## 2017-05-16 DIAGNOSIS — C50511 Malignant neoplasm of lower-outer quadrant of right female breast: Secondary | ICD-10-CM

## 2017-05-16 DIAGNOSIS — R0982 Postnasal drip: Secondary | ICD-10-CM

## 2017-05-16 DIAGNOSIS — Z5111 Encounter for antineoplastic chemotherapy: Secondary | ICD-10-CM | POA: Diagnosis not present

## 2017-05-16 DIAGNOSIS — C773 Secondary and unspecified malignant neoplasm of axilla and upper limb lymph nodes: Secondary | ICD-10-CM

## 2017-05-16 DIAGNOSIS — Z170 Estrogen receptor positive status [ER+]: Principal | ICD-10-CM

## 2017-05-16 DIAGNOSIS — Z79899 Other long term (current) drug therapy: Secondary | ICD-10-CM | POA: Insufficient documentation

## 2017-05-16 DIAGNOSIS — R5383 Other fatigue: Secondary | ICD-10-CM | POA: Insufficient documentation

## 2017-05-16 DIAGNOSIS — R05 Cough: Secondary | ICD-10-CM | POA: Diagnosis not present

## 2017-05-16 DIAGNOSIS — Z9013 Acquired absence of bilateral breasts and nipples: Secondary | ICD-10-CM | POA: Insufficient documentation

## 2017-05-16 LAB — COMPREHENSIVE METABOLIC PANEL
ALT: 70 U/L — AB (ref 0–55)
ANION GAP: 8 meq/L (ref 3–11)
AST: 47 U/L — AB (ref 5–34)
Albumin: 4 g/dL (ref 3.5–5.0)
Alkaline Phosphatase: 60 U/L (ref 40–150)
BILIRUBIN TOTAL: 0.35 mg/dL (ref 0.20–1.20)
BUN: 11.5 mg/dL (ref 7.0–26.0)
CHLORIDE: 105 meq/L (ref 98–109)
CO2: 26 meq/L (ref 22–29)
Calcium: 9.4 mg/dL (ref 8.4–10.4)
Creatinine: 0.8 mg/dL (ref 0.6–1.1)
EGFR: >60 mL/min/{1.73_m2} (ref >60)
GLUCOSE: 106 mg/dL (ref 70–140)
Potassium: 3.7 mEq/L (ref 3.5–5.1)
Sodium: 140 mEq/L (ref 136–145)
TOTAL PROTEIN: 6.7 g/dL (ref 6.4–8.3)

## 2017-05-16 LAB — CBC WITH DIFFERENTIAL/PLATELET
BASO%: 3.3 % — ABNORMAL HIGH (ref 0.0–2.0)
Basophils Absolute: 0.1 10*3/uL (ref 0.0–0.1)
EOS ABS: 0 10*3/uL (ref 0.0–0.5)
EOS%: 0.6 % (ref 0.0–7.0)
HCT: 31.6 % — ABNORMAL LOW (ref 34.8–46.6)
HEMOGLOBIN: 10.6 g/dL — AB (ref 11.6–15.9)
LYMPH%: 20.3 % (ref 14.0–49.7)
MCH: 31.5 pg (ref 25.1–34.0)
MCHC: 33.4 g/dL (ref 31.5–36.0)
MCV: 94.4 fL (ref 79.5–101.0)
MONO#: 0.6 10*3/uL (ref 0.1–0.9)
MONO%: 21.7 % — AB (ref 0.0–14.0)
NEUT#: 1.4 10*3/uL — ABNORMAL LOW (ref 1.5–6.5)
NEUT%: 54.1 % (ref 38.4–76.8)
Platelets: 298 10*3/uL (ref 145–400)
RBC: 3.35 10*6/uL — ABNORMAL LOW (ref 3.70–5.45)
RDW: 17.8 % — AB (ref 11.2–14.5)
WBC: 2.6 10*3/uL — ABNORMAL LOW (ref 3.9–10.3)
lymph#: 0.5 10*3/uL — ABNORMAL LOW (ref 0.9–3.3)

## 2017-05-16 MED ORDER — SODIUM CHLORIDE 0.9% FLUSH
10.0000 mL | INTRAVENOUS | Status: DC | PRN
  Administered 2017-05-16: 10 mL
  Filled 2017-05-16: qty 10

## 2017-05-16 MED ORDER — DEXAMETHASONE SODIUM PHOSPHATE 10 MG/ML IJ SOLN
INTRAMUSCULAR | Status: AC
  Filled 2017-05-16: qty 1

## 2017-05-16 MED ORDER — FAMOTIDINE IN NACL 20-0.9 MG/50ML-% IV SOLN
INTRAVENOUS | Status: AC
  Filled 2017-05-16: qty 50

## 2017-05-16 MED ORDER — SODIUM CHLORIDE 0.9 % IV SOLN
65.0000 mg/m2 | Freq: Once | INTRAVENOUS | Status: AC
  Administered 2017-05-16: 102 mg via INTRAVENOUS
  Filled 2017-05-16: qty 17

## 2017-05-16 MED ORDER — PALONOSETRON HCL INJECTION 0.25 MG/5ML
0.2500 mg | Freq: Once | INTRAVENOUS | Status: AC
  Administered 2017-05-16: 0.25 mg via INTRAVENOUS

## 2017-05-16 MED ORDER — DEXAMETHASONE SODIUM PHOSPHATE 10 MG/ML IJ SOLN
10.0000 mg | Freq: Once | INTRAMUSCULAR | Status: AC
  Administered 2017-05-16: 10 mg via INTRAVENOUS

## 2017-05-16 MED ORDER — SODIUM CHLORIDE 0.9 % IV SOLN
Freq: Once | INTRAVENOUS | Status: AC
  Administered 2017-05-16: 10:00:00 via INTRAVENOUS

## 2017-05-16 MED ORDER — DIPHENHYDRAMINE HCL 50 MG/ML IJ SOLN
INTRAMUSCULAR | Status: AC
  Filled 2017-05-16: qty 1

## 2017-05-16 MED ORDER — FAMOTIDINE IN NACL 20-0.9 MG/50ML-% IV SOLN
20.0000 mg | Freq: Once | INTRAVENOUS | Status: AC
  Administered 2017-05-16: 20 mg via INTRAVENOUS

## 2017-05-16 MED ORDER — SODIUM CHLORIDE 0.9% FLUSH
10.0000 mL | INTRAVENOUS | Status: DC | PRN
  Administered 2017-05-16: 10 mL via INTRAVENOUS
  Filled 2017-05-16: qty 10

## 2017-05-16 MED ORDER — PALONOSETRON HCL INJECTION 0.25 MG/5ML
INTRAVENOUS | Status: AC
  Filled 2017-05-16: qty 5

## 2017-05-16 MED ORDER — DIPHENHYDRAMINE HCL 50 MG/ML IJ SOLN
50.0000 mg | Freq: Once | INTRAMUSCULAR | Status: AC
  Administered 2017-05-16: 50 mg via INTRAVENOUS

## 2017-05-16 MED ORDER — HEPARIN SOD (PORK) LOCK FLUSH 100 UNIT/ML IV SOLN
500.0000 [IU] | Freq: Once | INTRAVENOUS | Status: AC | PRN
  Administered 2017-05-16: 500 [IU]
  Filled 2017-05-16: qty 5

## 2017-05-16 NOTE — Patient Instructions (Signed)
[facility] Discharge Instructions for Patients Receiving Chemotherapy  Today you received the following chemotherapy agents Taxol  To help prevent nausea and vomiting after your treatment, we encourage you to take your nausea medication Taxol   If you develop nausea and vomiting that is not controlled by your nausea medication, call the clinic.   BELOW ARE SYMPTOMS THAT SHOULD BE REPORTED IMMEDIATELY:  *FEVER GREATER THAN 100.5 F  *CHILLS WITH OR WITHOUT FEVER  NAUSEA AND VOMITING THAT IS NOT CONTROLLED WITH YOUR NAUSEA MEDICATION  *UNUSUAL SHORTNESS OF BREATH  *UNUSUAL BRUISING OR BLEEDING  TENDERNESS IN MOUTH AND THROAT WITH OR WITHOUT PRESENCE OF ULCERS  *URINARY PROBLEMS  *BOWEL PROBLEMS  UNUSUAL RASH Items with * indicate a potential emergency and should be followed up as soon as possible.  Feel free to call the clinic should you have any questions or concerns. The clinic phone number is [phone].  Please show the CHEMO ALERT CARD at check-in to the Emergency Department and triage nurse.   

## 2017-05-16 NOTE — Progress Notes (Signed)
Patient Care Team: Jettie Booze, NP as PCP - General (Family Medicine) Nicholas Lose, MD as Consulting Physician (Hematology and Oncology) Jovita Kussmaul, MD as Consulting Physician (General Surgery) Gery Pray, MD as Consulting Physician (Radiation Oncology)  DIAGNOSIS:  Encounter Diagnosis  Name Primary?  . 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, estrogen receptor positive ([place])     SUMMARY OF ONCOLOGIC HISTORY:   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, estrogen receptor positive ([place])   01/02/2017 Initial Diagnosis    Palpable right breast mass retroareolar 6:30 position: 3.6 cm size axilla negative, 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 HER-2 negative ratio 1.31 Ki-67 3% in addition calcifications UIQ 1.4 cm stereotactic biopsy flat epithelial atypia; clips are 4.3 cm apart, T2 N0 stage IB clinical stage AJCC 8      01/28/2017 Surgery    Bilateral mastectomies: Right: Grade 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%, PR 95%, HER-2 negative ratio 1.31, Ki-67 3%, 4/4 lymph nodes positive; left mastectomy: PASH and FC changes, no malignancy; T2 N2,  stage IIA AJCC 8      02/14/2017 Surgery    Right axillary lymph node dissection 8/11 lymph nodes positive      03/06/2017 -  Chemotherapy    Dose dense AC x4 followed by Taxol x12       CHIEF COMPLIANT: Cycle 2 Taxol  INTERVAL HISTORY: Angelica Floyd is a 52 year old with above-mentioned history of bilateral mastectomies for right-sided breast cancer who also underwent right axillary lymph node dissection.  8 out of 11 lymph nodes were positive.  The tumor was estrogen receptor positive and is currently receiving adjuvant chemotherapy.  Today is cycle 2 of Taxol.  She tolerated Taxol fairly well without any major side effects.  Denies any neuropathy.  Complains of postnasal drip and a cough  REVIEW OF SYSTEMS:   Constitutional: Denies fevers, chills or abnormal weight  loss Eyes: Denies blurriness of vision Ears, nose, mouth, throat, and face: Cough and postnasal drip Respiratory: Cough Cardiovascular: Denies palpitation, chest discomfort Gastrointestinal:  Denies nausea, heartburn or change in bowel habits Skin: Denies abnormal skin rashes Lymphatics: Denies new lymphadenopathy or easy bruising Neurological:Denies numbness, tingling or new weaknesses Behavioral/Psych: Mood is stable, no new changes  Extremities: No lower extremity edema Breast:  denies any pain or lumps or nodules in either breasts All other systems were reviewed with the patient and are negative.  I have reviewed the past medical history, past surgical history, social history and family history with the patient and they are unchanged from previous note.  ALLERGIES:  has No Known Allergies.  MEDICATIONS:  Current Outpatient Medications  Medication Sig Dispense Refill  . Acetaminophen (TYLENOL CHILDRENS CHEWABLES PO) Take by mouth as needed.    Marland Kitchen ketotifen (ZADITOR) 0.025 % ophthalmic solution Apply 1 drop to eye daily as needed (allergies).    Marland Kitchen lidocaine-prilocaine (EMLA) cream Apply to affected area once 30 g 3  . LORazepam (ATIVAN) 0.5 MG tablet Take 1 tablet (0.5 mg total) by mouth at bedtime. 30 tablet 0  . Multiple Vitamins-Minerals (CENTRUM ADULTS) TABS Take 1 tablet by mouth daily.     . ondansetron (ZOFRAN) 8 MG tablet Take 1 tablet (8 mg total) by mouth 2 (two) times daily as needed. Start on the third day after chemotherapy. 30 tablet 1  . prochlorperazine (COMPAZINE) 10 MG tablet Take 1 tablet (10 mg total) by mouth every 6 (six)  hours as needed (Nausea or vomiting). 30 tablet 1   No current facility-administered medications for this visit.     PHYSICAL EXAMINATION: ECOG PERFORMANCE STATUS: 1 - Symptomatic but completely ambulatory  Vitals:   05/16/17 0930  BP: 133/89  Pulse: (!) 111  Resp: 14  Temp: 98.2 F (36.8 C)  SpO2: 100%   Filed Weights   05/16/17  0930  Weight: 117 lb (53.1 kg)    GENERAL:alert, no distress and comfortable SKIN: skin color, texture, turgor are normal, no rashes or significant lesions EYES: normal, Conjunctiva are pink and non-injected, sclera clear OROPHARYNX:no exudate, no erythema and lips, buccal mucosa, and tongue normal  NECK: supple, thyroid normal size, non-tender, without nodularity LYMPH:  no palpable lymphadenopathy in the cervical, axillary or inguinal LUNGS: clear to auscultation and percussion with normal breathing effort HEART: regular rate & rhythm and no murmurs and no lower extremity edema ABDOMEN:abdomen soft, non-tender and normal bowel sounds MUSCULOSKELETAL:no cyanosis of digits and no clubbing  NEURO: alert & oriented x 3 with fluent speech, no focal motor/sensory deficits EXTREMITIES: No lower extremity edema  LABORATORY DATA:  I have reviewed the data as listed   Chemistry      Component Value Date/Time   NA 139 05/09/2017 0839   K 3.7 05/09/2017 0839   CL 105 02/14/2017 0747   CO2 25 05/09/2017 0839   BUN 10.3 05/09/2017 0839   CREATININE 0.8 05/09/2017 0839      Component Value Date/Time   CALCIUM 9.5 05/09/2017 0839   ALKPHOS 79 05/09/2017 0839   AST 31 05/09/2017 0839   ALT 31 05/09/2017 0839   BILITOT 0.26 05/09/2017 0839       Lab Results  Component Value Date   WBC 2.6 (L) 05/16/2017   HGB 10.6 (L) 05/16/2017   HCT 31.6 (L) 05/16/2017   MCV 94.4 05/16/2017   PLT 298 05/16/2017   NEUTROABS 1.4 (L) 05/16/2017    ASSESSMENT & PLAN:  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, estrogen receptor positive ([place]) 01/28/2017: Bilateral mastectomies: Right: Grade 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%, PR 95%, HER-2 negative ratio 1.31, Ki-67 3%, 4/4 lymph nodes positive; left mastectomy: PASH and FC changes, no malignancy; T2 N2, stage IIA AJCC 8 02-14-17: 8/10 lymph nodes positive  CT chest 02/13/2017: 3 mm right middle lobe nodule likely benign  benign cysts in the liver, ovarian cysts few small sclerotic lesions in the bone likely bone [place] Bone scan 02/13/2017: No bone metastases  Treatment plan: 1. adjuvant chemotherapy with dose dense Adriamycin and Cytoxan x4 followed by Taxol weekly x12 3. Adjuvant radiation 4. Adjuvant antiestrogen therapy with tamoxifen (which was originally started prior to surgery) ---------------------------------------------------------------------- Current treatment: Completed 4 cycles of dose dense Adriamycin Cytoxan, today cycle 2 Taxol  Chemo toxicities: 1.  Alopecia 2. Fatigue 3.  Her ANC today is 1.4.  It is adequate to treat.  I will decrease the dosage of Taxol to 65 mg/m square in order to avoid further lowering of the ANC. 4.  Cough and postnasal drip: Encouraged her to take Claritin daily Return to clinic in 2 weeks for cycle 4 of Taxol    I spent 25 minutes talking to the patient of which more than half was spent in counseling and coordination of care.  No orders of the defined types were placed in this encounter.  The patient has a good understanding of the overall plan. she agrees with it. she will call  with any problems that may develop before the next visit here.   Harriette Ohara, MD 05/16/17

## 2017-05-16 NOTE — Progress Notes (Signed)
Per Dr. Lindi Adie ok to treat with [place] 1.4.

## 2017-05-21 ENCOUNTER — Ambulatory Visit: Payer: BLUE CROSS/BLUE SHIELD | Attending: General Surgery

## 2017-05-21 DIAGNOSIS — R293 Abnormal posture: Secondary | ICD-10-CM | POA: Diagnosis not present

## 2017-05-21 DIAGNOSIS — M25612 Stiffness of left shoulder, not elsewhere classified: Secondary | ICD-10-CM | POA: Diagnosis present

## 2017-05-21 DIAGNOSIS — M25611 Stiffness of right shoulder, not elsewhere classified: Secondary | ICD-10-CM | POA: Diagnosis present

## 2017-05-21 DIAGNOSIS — Z483 Aftercare following surgery for neoplasm: Secondary | ICD-10-CM | POA: Insufficient documentation

## 2017-05-21 NOTE — Therapy (Signed)
[place], [place], [address] Phone: [phone]   Fax:  [phone]  Physical Therapy Treatment  Patient Details  Name: Angelica Floyd MRN: [id number] Date of Birth: January 11, 1966 Referring Provider: Dr. Nicholas Lose   Encounter Date: 05/21/2017  PT End of Session - 05/21/17 0934    Visit Number  15    Number of Visits  16    Date for PT Re-Evaluation  05/24/17    PT Start Time  0849    PT Stop Time  0934    PT Time Calculation (min)  45 min    Activity Tolerance  Patient tolerated treatment well    Behavior During Therapy  [facility] for tasks assessed/performed       Past Medical History:  Diagnosis Date  . Anxiety    since breast cancer  . 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, estrogen receptor positive ([place]) 01/08/2017    Past Surgical History:  Procedure Laterality Date  . ABDOMINAL HYSTERECTOMY    . ADENOIDECTOMY W/ MYRINGOTOMY    . APPENDECTOMY    . AXILLARY LYMPH NODE DISSECTION Right 02/14/2017   Procedure: RIGHT AXILLARY LYMPH NODE DISSECTION ERAS PATHWAY;  Surgeon: Jovita Kussmaul, MD;  Location: [place];  Service: General;  Laterality: Right;  . CESAREAN SECTION    . MASTECTOMY W/ SENTINEL NODE BIOPSY Bilateral 01/28/2017   RIGHT BREAST BIOPSY  . MASTECTOMY W/ SENTINEL NODE BIOPSY Bilateral 01/28/2017   Procedure: BILATERAL MASTECTOMY WITH RIGHT SENTINEL LYMPH NODE BIOPSY;  Surgeon: Jovita Kussmaul, MD;  Location: [facility];  Service: General;  Laterality: Bilateral;  . PORTACATH PLACEMENT Left 02/14/2017   Procedure: INSERTION PORT-A-CATH;  Surgeon: Jovita Kussmaul, MD;  Location: [facility];  Service: General;  Laterality: Left;  . RE-EXCISION OF BREAST CANCER,SUPERIOR MARGINS N/A 02/14/2017   Procedure: RE-EXCISION INFERIOR FLAP;  Surgeon: Jovita Kussmaul, MD;  Location: [facility];  Service: General;  Laterality: N/A;  . WISDOM TOOTH EXTRACTION      There were no vitals  filed for this visit.  Subjective Assessment - 05/21/17 0900    Subjective  The kinesiotape stayed on until Sunday so I hadn't used the one you gave me. I feel like the cording is worse now at the back of my Rt axilla. When I stretch at home now I can feel it down to my incision and scar tissue so I think that's good improvement.     Pertinent History  Bilateral mastectomy for right hormone receptive breast cancer with right ALNB 01/28/17 followed by re-excision and ALND 02/14/17. Undergoing chemotherapy.    Patient Stated Goals  Improve arm function; prevent lymphedema    Currently in Pain?  No/denies                      [facility] Adult PT Treatment/Exercise - 05/21/17 0001      Shoulder Exercises: Pulleys   Flexion  2 minutes    ABduction  2 minutes      Shoulder Exercises: Therapy Ball   Flexion  10 reps With forward lean into end of stretch with 1 lb on wrists    ABduction  10 reps Bil UE with same side lean into end of stretch; 1 lb wrists      Manual Therapy   Soft tissue mobilization  to cords at right axilla     Myofascial Release  very gently to cording in right axilla while  performing PROM    Passive ROM  In Supine: Rt shoulder to pts tolerance into flexion, abduction, er and D2      Kinesiotix   Edema  skin prep then tape in 4 finger fan shape along right lateral side from axilla and inferior to incision to lower abdomen                    Breast Clinic Goals - 01/09/17 1430      Patient will be able to verbalize understanding of pertinent lymphedema risk reduction practices relevant to her diagnosis specifically related to skin care.   Time  1    Period  Days    Status  Achieved      Patient will be able to return demonstrate and/or verbalize understanding of the post-op home exercise program related to regaining shoulder range of motion.   Time  1    Period  Days    Status  Achieved      Patient will be able to verbalize understanding of the  importance of attending the postoperative After Breast Cancer Class for further lymphedema risk reduction education and therapeutic exercise.   Time  1    Period  Days    Status  Achieved       Long Term Clinic Goals - 05/13/17 1214      CC Long Term Goal  #1   Title  Patient will be independent in her home exercise program for improving shoulder ROM.    Baseline  Progressed HEP to include Strength ABC Program today-05/13/17    Status  Partially Met      CC Long Term Goal  #2   Title  Incease bilateral shoulder active flexion to >/= 130 degrees for increased ease reaching.    Baseline  Rt 110 degrees (cording very limiting) and Lt 133 degrees=04/02/17; Rt 138 and Lt 151 degrees-05/13/17    Status  Achieved      CC Long Term Goal  #3   Title  Incease bilateral shoulder active abduction to >/= 130 degrees for increased ease reaching.    Baseline  Rt 103 degrees (cording very limiting) and Lt 134 degrees-04/02/17; Rt 140 and Lt 153 degrees-05/13/17    Status  Achieved      CC Long Term Goal  #4   Title  Patient will report she has returned to running without increased edema in her right lateral trunk.    Baseline  Did run a half of the Thanksgiving [place] trot, but hasn't run since last chemo due to cold; Pt has gone running a few times up to 3 miles and reports this feeling good-05/13/17    Status  Partially Met      CC Long Term Goal  #5   Title  Patient will verbalize understanding of lymphedema risk reduction practices.    Status  Achieved         Plan - 05/21/17 0935    Clinical Impression Statement  Pt reports the kinesiotaping helping the swelling at her Rt lateral trunk some. Also she has been noticing that she feels her stretches down into her incision now and isn't as limited by the cording which is still present, but less so. Continued with manual therapy today focusing on end ROM stretching and myofascial release to cording.     Rehab Potential  Excellent    Clinical  Impairments Affecting Rehab Potential  currently undergoing chemo    PT Frequency  2x /  week    PT Duration  4 weeks    PT Treatment/Interventions  Patient/family education;Therapeutic exercise;ADLs/Self Care Home Management;Therapeutic activities;Manual techniques;Manual lymph drainage;Scar mobilization;Passive range of motion    PT Next Visit Plan  If pt still wishes to cont will need renewal in next 1-2 visits or D/C.     Consulted and Agree with Plan of Care  Patient       Patient will benefit from skilled therapeutic intervention in order to improve the following deficits and impairments:  Postural dysfunction, Decreased knowledge of precautions, Pain, Impaired UE functional use, Decreased range of motion  Visit Diagnosis: Abnormal posture  Stiffness of right shoulder, not elsewhere classified  Stiffness of left shoulder, not elsewhere classified  Aftercare following surgery for neoplasm     Problem List Patient Active Problem List   Diagnosis Date Noted  . Cancer of central portion of right female breast ([place]) 01/28/2017  . 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, estrogen receptor positive ([place]) 01/08/2017    Otelia Limes, PTA 05/21/2017, 9:38 AM  [place], [place], [address] Phone: [phone]   Fax:  [phone]  Name: Angelica Floyd MRN: [id number] Date of Birth: 14-Nov-1965

## 2017-05-22 ENCOUNTER — Telehealth: Payer: Self-pay | Admitting: Hematology and Oncology

## 2017-05-22 NOTE — Telephone Encounter (Signed)
Per 1/3 no los °

## 2017-05-23 ENCOUNTER — Inpatient Hospital Stay: Payer: BLUE CROSS/BLUE SHIELD

## 2017-05-23 VITALS — BP 135/89 | HR 104 | Temp 98.0°F | Resp 16

## 2017-05-23 DIAGNOSIS — C50511 Malignant neoplasm of lower-outer quadrant of right female breast: Secondary | ICD-10-CM

## 2017-05-23 DIAGNOSIS — Z9013 Acquired absence of bilateral breasts and nipples: Secondary | ICD-10-CM | POA: Diagnosis not present

## 2017-05-23 DIAGNOSIS — Z170 Estrogen receptor positive status [ER+]: Secondary | ICD-10-CM | POA: Diagnosis not present

## 2017-05-23 DIAGNOSIS — R5383 Other fatigue: Secondary | ICD-10-CM | POA: Diagnosis not present

## 2017-05-23 DIAGNOSIS — Z79899 Other long term (current) drug therapy: Secondary | ICD-10-CM | POA: Diagnosis not present

## 2017-05-23 DIAGNOSIS — Z5111 Encounter for antineoplastic chemotherapy: Secondary | ICD-10-CM | POA: Diagnosis not present

## 2017-05-23 LAB — CBC WITH DIFFERENTIAL/PLATELET
BASOS PCT: 2 %
Basophils Absolute: 0.1 10*3/uL (ref 0.0–0.1)
EOS ABS: 0 10*3/uL (ref 0.0–0.5)
Eosinophils Relative: 1 %
HEMATOCRIT: 32.8 % — AB (ref 34.8–46.6)
Hemoglobin: 10.8 g/dL — ABNORMAL LOW (ref 11.6–15.9)
LYMPHS ABS: 0.7 10*3/uL — AB (ref 0.9–3.3)
Lymphocytes Relative: 27 %
MCH: 31.8 pg (ref 25.1–34.0)
MCHC: 32.9 g/dL (ref 31.5–36.0)
MCV: 96.5 fL (ref 79.5–101.0)
MONO ABS: 0.5 10*3/uL (ref 0.1–0.9)
Monocytes Relative: 18 %
Neutro Abs: 1.4 10*3/uL — ABNORMAL LOW (ref 1.5–6.5)
Neutrophils Relative %: 52 %
Platelets: 258 10*3/uL (ref 145–400)
RBC: 3.4 MIL/uL — ABNORMAL LOW (ref 3.70–5.45)
RDW: 16 % (ref 11.2–16.1)
WBC: 2.7 10*3/uL — ABNORMAL LOW (ref 3.9–10.3)

## 2017-05-23 LAB — COMPREHENSIVE METABOLIC PANEL
ALBUMIN: 4 g/dL (ref 3.5–5.0)
ALK PHOS: 54 U/L (ref 40–150)
ALT: 60 U/L — AB (ref 0–55)
AST: 46 U/L — AB (ref 5–34)
Anion gap: 7 (ref 3–11)
BUN: 10 mg/dL (ref 7–26)
CALCIUM: 9.3 mg/dL (ref 8.4–10.4)
CHLORIDE: 105 mmol/L (ref 98–109)
CO2: 27 mmol/L (ref 22–29)
CREATININE: 0.78 mg/dL (ref 0.60–1.10)
GFR calc Af Amer: >60 mL/min (ref >60)
GFR calc non Af Amer: >60 mL/min (ref >60)
GLUCOSE: 114 mg/dL (ref 70–140)
Potassium: 3.7 mmol/L (ref 3.3–4.7)
Sodium: 139 mmol/L (ref 136–145)
Total Bilirubin: 0.3 mg/dL (ref 0.2–1.2)
Total Protein: 6.7 g/dL (ref 6.4–8.3)

## 2017-05-23 MED ORDER — DEXAMETHASONE SODIUM PHOSPHATE 10 MG/ML IJ SOLN
INTRAMUSCULAR | Status: AC
Start: 2017-05-23 — End: ?
  Filled 2017-05-23: qty 1

## 2017-05-23 MED ORDER — SODIUM CHLORIDE 0.9 % IV SOLN
Freq: Once | INTRAVENOUS | Status: AC
  Administered 2017-05-23: 10:00:00 via INTRAVENOUS

## 2017-05-23 MED ORDER — DIPHENHYDRAMINE HCL 50 MG/ML IJ SOLN
50.0000 mg | Freq: Once | INTRAMUSCULAR | Status: AC
  Administered 2017-05-23: 50 mg via INTRAVENOUS

## 2017-05-23 MED ORDER — SODIUM CHLORIDE 0.9 % IV SOLN
65.0000 mg/m2 | Freq: Once | INTRAVENOUS | Status: AC
  Administered 2017-05-23: 102 mg via INTRAVENOUS
  Filled 2017-05-23: qty 17

## 2017-05-23 MED ORDER — FAMOTIDINE IN NACL 20-0.9 MG/50ML-% IV SOLN
INTRAVENOUS | Status: AC
  Filled 2017-05-23: qty 50

## 2017-05-23 MED ORDER — SODIUM CHLORIDE 0.9% FLUSH
10.0000 mL | INTRAVENOUS | Status: DC | PRN
  Administered 2017-05-23: 10 mL via INTRAVENOUS
  Filled 2017-05-23: qty 10

## 2017-05-23 MED ORDER — PALONOSETRON HCL INJECTION 0.25 MG/5ML
INTRAVENOUS | Status: AC
  Filled 2017-05-23: qty 5

## 2017-05-23 MED ORDER — DEXAMETHASONE SODIUM PHOSPHATE 10 MG/ML IJ SOLN
10.0000 mg | Freq: Once | INTRAMUSCULAR | Status: AC
  Administered 2017-05-23: 10 mg via INTRAVENOUS

## 2017-05-23 MED ORDER — PALONOSETRON HCL INJECTION 0.25 MG/5ML
0.2500 mg | Freq: Once | INTRAVENOUS | Status: AC
  Administered 2017-05-23: 0.25 mg via INTRAVENOUS

## 2017-05-23 MED ORDER — FAMOTIDINE IN NACL 20-0.9 MG/50ML-% IV SOLN
20.0000 mg | Freq: Once | INTRAVENOUS | Status: AC
  Administered 2017-05-23: 20 mg via INTRAVENOUS

## 2017-05-23 MED ORDER — PACLITAXEL CHEMO INJECTION 300 MG/50ML: 65.0000 mg/m2 | Freq: Once | INTRAVENOUS | Status: DC

## 2017-05-23 MED ORDER — DIPHENHYDRAMINE HCL 50 MG/ML IJ SOLN
INTRAMUSCULAR | Status: AC
  Filled 2017-05-23: qty 1

## 2017-05-23 MED ORDER — SODIUM CHLORIDE 0.9% FLUSH
10.0000 mL | INTRAVENOUS | Status: DC | PRN
  Administered 2017-05-23: 10 mL
  Filled 2017-05-23: qty 10

## 2017-05-23 MED ORDER — HEPARIN SOD (PORK) LOCK FLUSH 100 UNIT/ML IV SOLN
500.0000 [IU] | Freq: Once | INTRAVENOUS | Status: AC | PRN
  Administered 2017-05-23: 500 [IU]
  Filled 2017-05-23: qty 5

## 2017-05-23 NOTE — Progress Notes (Signed)
Per Dr Lindi Adie ok to tx today with heart rate 104 and ANC 1.4. No new orders.

## 2017-05-23 NOTE — Patient Instructions (Signed)
Implanted Port Home Guide An implanted port is a type of central line that is placed under the skin. Central lines are used to provide IV access when treatment or nutrition needs to be given through a person's veins. Implanted ports are used for long-term IV access. An implanted port may be placed because:  You need IV medicine that would be irritating to the small veins in your hands or arms.  You need long-term IV medicines, such as antibiotics.  You need IV nutrition for a long period.  You need frequent blood draws for lab tests.  You need dialysis.  Implanted ports are usually placed in the chest area, but they can also be placed in the upper arm, the abdomen, or the leg. An implanted port has two main parts:  Reservoir. The reservoir is round and will appear as a small, raised area under your skin. The reservoir is the part where a needle is inserted to give medicines or draw blood.  Catheter. The catheter is a thin, flexible tube that extends from the reservoir. The catheter is placed into a large vein. Medicine that is inserted into the reservoir goes into the catheter and then into the vein.  How will I care for my incision site? Do not get the incision site wet. Bathe or shower as directed by your health care provider. How is my port accessed? Special steps must be taken to access the port:  Before the port is accessed, a numbing cream can be placed on the skin. This helps numb the skin over the port site.  Your health care provider uses a sterile technique to access the port. ? Your health care provider must put on a mask and sterile gloves. ? The skin over your port is cleaned carefully with an antiseptic and allowed to dry. ? The port is gently pinched between sterile gloves, and a needle is inserted into the port.  Only "non-coring" port needles should be used to access the port. Once the port is accessed, a blood return should be checked. This helps ensure that the port  is in the vein and is not clogged.  If your port needs to remain accessed for a constant infusion, a clear (transparent) bandage will be placed over the needle site. The bandage and needle will need to be changed every week, or as directed by your health care provider.  Keep the bandage covering the needle clean and dry. Do not get it wet. Follow your health care provider's instructions on how to take a shower or bath while the port is accessed.  If your port does not need to stay accessed, no bandage is needed over the port.  What is flushing? Flushing helps keep the port from getting clogged. Follow your health care provider's instructions on how and when to flush the port. Ports are usually flushed with saline solution or a medicine called heparin. The need for flushing will depend on how the port is used.  If the port is used for intermittent medicines or blood draws, the port will need to be flushed: ? After medicines have been given. ? After blood has been drawn. ? As part of routine maintenance.  If a constant infusion is running, the port may not need to be flushed.  How long will my port stay implanted? The port can stay in for as long as your health care provider thinks it is needed. When it is time for the port to come out, surgery will be   done to remove it. The procedure is similar to the one performed when the port was put in. When should I seek immediate medical care? When you have an implanted port, you should seek immediate medical care if:  You notice a bad smell coming from the incision site.  You have swelling, redness, or drainage at the incision site.  You have more swelling or pain at the port site or the surrounding area.  You have a fever that is not controlled with medicine.  This information is not intended to replace advice given to you by your health care provider. Make sure you discuss any questions you have with your health care provider. Document  Released: 04/30/2005 Document Revised: 10/06/2015 Document Reviewed: 01/05/2013 [employer] Interactive Patient Education  2017 [employer]  

## 2017-05-23 NOTE — Patient Instructions (Signed)
[facility] Discharge Instructions for Patients Receiving Chemotherapy  Today you received the following chemotherapy agents Taxol  To help prevent nausea and vomiting after your treatment, we encourage you to take your nausea medication Taxol   If you develop nausea and vomiting that is not controlled by your nausea medication, call the clinic.   BELOW ARE SYMPTOMS THAT SHOULD BE REPORTED IMMEDIATELY:  *FEVER GREATER THAN 100.5 F  *CHILLS WITH OR WITHOUT FEVER  NAUSEA AND VOMITING THAT IS NOT CONTROLLED WITH YOUR NAUSEA MEDICATION  *UNUSUAL SHORTNESS OF BREATH  *UNUSUAL BRUISING OR BLEEDING  TENDERNESS IN MOUTH AND THROAT WITH OR WITHOUT PRESENCE OF ULCERS  *URINARY PROBLEMS  *BOWEL PROBLEMS  UNUSUAL RASH Items with * indicate a potential emergency and should be followed up as soon as possible.  Feel free to call the clinic should you have any questions or concerns. The clinic phone number is [phone].  Please show the CHEMO ALERT CARD at check-in to the Emergency Department and triage nurse.   

## 2017-05-27 ENCOUNTER — Ambulatory Visit: Payer: BLUE CROSS/BLUE SHIELD

## 2017-05-27 ENCOUNTER — Other Ambulatory Visit: Payer: Self-pay

## 2017-05-27 DIAGNOSIS — R293 Abnormal posture: Secondary | ICD-10-CM

## 2017-05-27 DIAGNOSIS — M25612 Stiffness of left shoulder, not elsewhere classified: Secondary | ICD-10-CM

## 2017-05-27 DIAGNOSIS — Z170 Estrogen receptor positive status [ER+]: Principal | ICD-10-CM

## 2017-05-27 DIAGNOSIS — Z483 Aftercare following surgery for neoplasm: Secondary | ICD-10-CM

## 2017-05-27 DIAGNOSIS — M25611 Stiffness of right shoulder, not elsewhere classified: Secondary | ICD-10-CM

## 2017-05-27 DIAGNOSIS — C50511 Malignant neoplasm of lower-outer quadrant of right female breast: Secondary | ICD-10-CM

## 2017-05-27 MED ORDER — MAGIC MOUTHWASH W/LIDOCAINE: 5.0000 mL | Freq: Three times a day (TID) | ORAL | 1 refills | Status: DC | PRN

## 2017-05-27 MED ORDER — LORAZEPAM 0.5 MG PO TABS: 0.5000 mg | ORAL_TABLET | Freq: Every day | ORAL | 0 refills | Status: DC

## 2017-05-27 NOTE — Therapy (Signed)
[place], [place], [address] Phone: [phone]   Fax:  [phone]  Physical Therapy Treatment  Patient Details  Name: Angelica Floyd MRN: [id number] Date of Birth: 14-Feb-1966 Referring Provider: Dr. Nicholas Lose   Encounter Date: 05/27/2017  PT End of Session - 05/27/17 1223    Visit Number  16    Number of Visits  16    Date for PT Re-Evaluation  05/24/17    PT Start Time  0848    PT Stop Time  0931    PT Time Calculation (min)  43 min    Activity Tolerance  Patient tolerated treatment well    Behavior During Therapy  [facility] for tasks assessed/performed       Past Medical History:  Diagnosis Date  . Anxiety    since breast cancer  . 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, estrogen receptor positive ([place]) 01/08/2017    Past Surgical History:  Procedure Laterality Date  . ABDOMINAL HYSTERECTOMY    . ADENOIDECTOMY W/ MYRINGOTOMY    . APPENDECTOMY    . AXILLARY LYMPH NODE DISSECTION Right 02/14/2017   Procedure: RIGHT AXILLARY LYMPH NODE DISSECTION ERAS PATHWAY;  Surgeon: Jovita Kussmaul, MD;  Location: [place];  Service: General;  Laterality: Right;  . CESAREAN SECTION    . MASTECTOMY W/ SENTINEL NODE BIOPSY Bilateral 01/28/2017   RIGHT BREAST BIOPSY  . MASTECTOMY W/ SENTINEL NODE BIOPSY Bilateral 01/28/2017   Procedure: BILATERAL MASTECTOMY WITH RIGHT SENTINEL LYMPH NODE BIOPSY;  Surgeon: Jovita Kussmaul, MD;  Location: [place];  Service: General;  Laterality: Bilateral;  . PORTACATH PLACEMENT Left 02/14/2017   Procedure: INSERTION PORT-A-CATH;  Surgeon: Jovita Kussmaul, MD;  Location: [place];  Service: General;  Laterality: Left;  . RE-EXCISION OF BREAST CANCER,SUPERIOR MARGINS N/A 02/14/2017   Procedure: RE-EXCISION INFERIOR FLAP;  Surgeon: Jovita Kussmaul, MD;  Location: [place];  Service: General;  Laterality: N/A;  . WISDOM TOOTH EXTRACTION      There were no vitals  filed for this visit.  Subjective Assessment - 05/27/17 0851    Subjective  I'm starting to feel stretches in new areas like at my drain sites and I haven't felt it there before. My chemo went fine last week and I was just really tired all weekend. I have to force myself to walk and do the exercises but i feel better after I do them.     Pertinent History  Bilateral mastectomy for right hormone receptive breast cancer with right ALNB 01/28/17 followed by re-excision and ALND 02/14/17. Undergoing chemotherapy.    Patient Stated Goals  Improve arm function; prevent lymphedema    Currently in Pain?  No/denies         [facility] PT Assessment - 05/27/17 0001      AROM   Right Shoulder Flexion  147 Degrees    Right Shoulder ABduction  144 Degrees    Left Shoulder Flexion  155 Degrees Feels pull at drain site    Left Shoulder ABduction  166 Degrees                  [facility] Adult PT Treatment/Exercise - 05/27/17 0001      Shoulder Exercises: Pulleys   Flexion  1 minute    ABduction  2 minutes      Shoulder Exercises: Therapy Ball   Flexion  10 reps With forward lean into end of stretch  ABduction  10 reps Bil UE with same side lean stretch into end of range      Shoulder Exercises: Stretch   Other Shoulder Stretches  Instructed pt in variety of stretches to allow for stretch at Rt lateral trunk at incision sites/intercostal area: Lt S/L over large physioball, in doorway an din Lt S/L over towel roll on mat with Rt UE abduction.       Manual Therapy   Soft tissue mobilization  to cords at right axilla     Myofascial Release  very gently to cording in right axilla while performing PROM    Passive ROM  In Supine: Rt shoulder to pts tolerance into flexion, abduction, er and D2             PT Education - 05/27/17 1229    Education provided  Yes    Education Details  Instructed pt in a variety of stretches for Rt lateral trunk/drain incision sites area, see flowsheet.      Person(s) Educated  Patient    Methods  Explanation;Demonstration    Comprehension  Verbalized understanding;Returned demonstration           Breast Clinic Goals - 01/09/17 1430      Patient will be able to verbalize understanding of pertinent lymphedema risk reduction practices relevant to her diagnosis specifically related to skin care.   Time  1    Period  Days    Status  Achieved      Patient will be able to return demonstrate and/or verbalize understanding of the post-op home exercise program related to regaining shoulder range of motion.   Time  1    Period  Days    Status  Achieved      Patient will be able to verbalize understanding of the importance of attending the postoperative After Breast Cancer Class for further lymphedema risk reduction education and therapeutic exercise.   Time  1    Period  Days    Status  Achieved       Long Term Clinic Goals - 05/27/17 1226      CC Long Term Goal  #1   Title  Patient will be independent in her home exercise program for improving shoulder ROM.    Baseline  Progressed HEP to include Strength ABC Program today-05/13/17; pt is now consistent with all HEP-05/27/17    Status  Achieved      CC Long Term Goal  #2   Title  Incease bilateral shoulder active flexion to >/= 130 degrees for increased ease reaching.    Baseline  Rt 110 degrees (cording very limiting) and Lt 133 degrees=04/02/17; Rt 138 and Lt 151 degrees-05/13/17; Rt 147 and Lt 155 degrees-05/27/17    Status  Achieved      CC Long Term Goal  #3   Title  Incease bilateral shoulder active abduction to >/= 130 degrees for increased ease reaching.    Baseline  Rt 103 degrees (cording very limiting) and Lt 134 degrees-04/02/17; Rt 140 and Lt 153 degrees-05/13/17; Rt 144 and Lt 166 degrees-05/27/17    Status  Achieved      CC Long Term Goal  #4   Title  Patient will report she has returned to running without increased edema in her right lateral trunk.    Baseline  Did run  a half of the Thanksgiving [place] trot, but hasn't run since last chemo due to cold; Pt has gone running a few times up to 3 miles  and reports this feeling good-05/13/17; Pt has been walk/running as she feels able now and reports no problems with this-05/27/17    Status  Achieved      CC Long Term Goal  #5   Title  Patient will verbalize understanding of lymphedema risk reduction practices.    Status  Achieved         Plan - 05/27/17 1224    Clinical Impression Statement  Pt has overall done very well with therapy and though she still demonstrates some end ROM tightness/limitations in her Rt shoulder she reports feeling ready to D/C to HEP. Also due to she is now getting weekly chemo and needs to focus her time on that. Pt has met all goals and is ready for D/C.     Rehab Potential  Excellent    Clinical Impairments Affecting Rehab Potential  currently undergoing chemo    PT Frequency  2x / week    PT Duration  4 weeks    PT Treatment/Interventions  Patient/family education;Therapeutic exercise;ADLs/Self Care Home Management;Therapeutic activities;Manual techniques;Manual lymph drainage;Scar mobilization;Passive range of motion    PT Next Visit Plan  D/C this visit.    Consulted and Agree with Plan of Care  Patient       Patient will benefit from skilled therapeutic intervention in order to improve the following deficits and impairments:  Postural dysfunction, Decreased knowledge of precautions, Pain, Impaired UE functional use, Decreased range of motion  Visit Diagnosis: Abnormal posture  Stiffness of right shoulder, not elsewhere classified  Stiffness of left shoulder, not elsewhere classified  Aftercare following surgery for neoplasm     Problem List Patient Active Problem List   Diagnosis Date Noted  . Cancer of central portion of right female breast ([place]) 01/28/2017  . 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, estrogen receptor positive ([place])  01/08/2017    Otelia Limes, PTA 05/27/2017, 12:35 PM  [place], [place], [address] Phone: [phone]   Fax:  [phone]  Name: Angelica Floyd MRN: [id number] Date of Birth: 1965-08-08  PHYSICAL THERAPY DISCHARGE SUMMARY  Visits from Start of Care: 16  Current functional level related to goals / functional outcomes: See above  Remaining deficits: Still tight with end ROM  Education / Equipment: HEP Plan: Patient agrees to discharge.  Patient goals were met. Patient is being discharged due to meeting the stated rehab goals.  ?????     Allyson Sabal [place], [place] 05/27/17 12:41 PM

## 2017-05-28 ENCOUNTER — Other Ambulatory Visit: Payer: Self-pay

## 2017-05-28 MED ORDER — MAGIC MOUTHWASH W/LIDOCAINE: 5.0000 mL | Freq: Three times a day (TID) | ORAL | 1 refills | Status: DC | PRN

## 2017-05-28 NOTE — Telephone Encounter (Signed)
Called pt to inform her we sent In a prescription for Magic Mouthwash for her mouth sore. Instructions on how to use was given. Her ativan was also refilled. No other questions at this time.  Cyndia Bent RN

## 2017-05-29 NOTE — Assessment & Plan Note (Signed)
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 adjuvant chemotherapy with dose dense Adriamycin and Cytoxan x4 followed by Taxol weekly x12 3. Adjuvant radiation 4. Adjuvant antiestrogen therapy with tamoxifen (which was originally started prior to surgery) ---------------------------------------------------------------------- Current treatment: Completed 4 cycles of dose dense Adriamycin Cytoxan, today cycle 4 Taxol  Chemo toxicities: 1.  Alopecia 2. Fatigue 3.  Her ANC today is 1.4.  It is adequate to treat.  I will decrease the dosage of Taxol to 65 mg/m square in order to avoid further lowering of the ANC.  Return to clinic in 2 weeks for cycle 6 of Taxol

## 2017-05-30 ENCOUNTER — Inpatient Hospital Stay: Payer: BLUE CROSS/BLUE SHIELD

## 2017-05-30 ENCOUNTER — Inpatient Hospital Stay (HOSPITAL_BASED_OUTPATIENT_CLINIC_OR_DEPARTMENT_OTHER): Payer: BLUE CROSS/BLUE SHIELD | Admitting: Hematology and Oncology

## 2017-05-30 DIAGNOSIS — Z170 Estrogen receptor positive status [ER+]: Principal | ICD-10-CM

## 2017-05-30 DIAGNOSIS — Z9013 Acquired absence of bilateral breasts and nipples: Secondary | ICD-10-CM

## 2017-05-30 DIAGNOSIS — Z79899 Other long term (current) drug therapy: Secondary | ICD-10-CM

## 2017-05-30 DIAGNOSIS — C50511 Malignant neoplasm of lower-outer quadrant of right female breast: Secondary | ICD-10-CM | POA: Diagnosis not present

## 2017-05-30 LAB — COMPREHENSIVE METABOLIC PANEL
ALBUMIN: 4 g/dL (ref 3.5–5.0)
ALK PHOS: 58 U/L (ref 40–150)
ALT: 81 U/L — AB (ref 0–55)
ANION GAP: 10 (ref 3–11)
AST: 55 U/L — AB (ref 5–34)
BUN: 11 mg/dL (ref 7–26)
CALCIUM: 9.5 mg/dL (ref 8.4–10.4)
CO2: 26 mmol/L (ref 22–29)
CREATININE: 0.81 mg/dL (ref 0.60–1.10)
Chloride: 104 mmol/L (ref 98–109)
GFR calc Af Amer: >60 mL/min (ref >60)
GFR calc non Af Amer: >60 mL/min (ref >60)
GLUCOSE: 103 mg/dL (ref 70–140)
Potassium: 3.8 mmol/L (ref 3.3–4.7)
Sodium: 140 mmol/L (ref 136–145)
TOTAL PROTEIN: 6.9 g/dL (ref 6.4–8.3)
Total Bilirubin: 0.4 mg/dL (ref 0.2–1.2)

## 2017-05-30 LAB — CBC WITH DIFFERENTIAL/PLATELET
Basophils Absolute: 0.1 10*3/uL (ref 0.0–0.1)
Basophils Relative: 2 %
EOS PCT: 3 %
Eosinophils Absolute: 0.1 10*3/uL (ref 0.0–0.5)
HEMATOCRIT: 32.4 % — AB (ref 34.8–46.6)
Hemoglobin: 11 g/dL — ABNORMAL LOW (ref 11.6–15.9)
Lymphocytes Relative: 21 %
Lymphs Abs: 0.6 10*3/uL — ABNORMAL LOW (ref 0.9–3.3)
MCH: 32.1 pg (ref 25.1–34.0)
MCHC: 33.9 g/dL (ref 31.5–36.0)
MCV: 94.8 fL (ref 79.5–101.0)
MONO ABS: 0.4 10*3/uL (ref 0.1–0.9)
MONOS PCT: 16 %
NEUTROS ABS: 1.7 10*3/uL (ref 1.5–6.5)
Neutrophils Relative %: 58 %
PLATELETS: 261 10*3/uL (ref 145–400)
RBC: 3.41 MIL/uL — ABNORMAL LOW (ref 3.70–5.45)
RDW: 16.6 % — AB (ref 11.2–16.1)
WBC: 2.8 10*3/uL — ABNORMAL LOW (ref 3.9–10.3)

## 2017-05-30 MED ORDER — DEXAMETHASONE SODIUM PHOSPHATE 10 MG/ML IJ SOLN
10.0000 mg | Freq: Once | INTRAMUSCULAR | Status: AC
  Administered 2017-05-30: 10 mg via INTRAVENOUS

## 2017-05-30 MED ORDER — SODIUM CHLORIDE 0.9% FLUSH
10.0000 mL | INTRAVENOUS | Status: DC | PRN
  Administered 2017-05-30: 10 mL via INTRAVENOUS
  Filled 2017-05-30: qty 10

## 2017-05-30 MED ORDER — HEPARIN SOD (PORK) LOCK FLUSH 100 UNIT/ML IV SOLN
500.0000 [IU] | Freq: Once | INTRAVENOUS | Status: AC | PRN
  Administered 2017-05-30: 500 [IU]
  Filled 2017-05-30: qty 5

## 2017-05-30 MED ORDER — SODIUM CHLORIDE 0.9 % IV SOLN
Freq: Once | INTRAVENOUS | Status: AC
  Administered 2017-05-30: 10:00:00 via INTRAVENOUS

## 2017-05-30 MED ORDER — DEXAMETHASONE SODIUM PHOSPHATE 10 MG/ML IJ SOLN
INTRAMUSCULAR | Status: AC
  Filled 2017-05-30: qty 1

## 2017-05-30 MED ORDER — SODIUM CHLORIDE 0.9 % IV SOLN
65.0000 mg/m2 | Freq: Once | INTRAVENOUS | Status: AC
  Administered 2017-05-30: 102 mg via INTRAVENOUS
  Filled 2017-05-30: qty 17

## 2017-05-30 MED ORDER — FAMOTIDINE IN NACL 20-0.9 MG/50ML-% IV SOLN
INTRAVENOUS | Status: AC
  Filled 2017-05-30: qty 50

## 2017-05-30 MED ORDER — PALONOSETRON HCL INJECTION 0.25 MG/5ML
0.2500 mg | Freq: Once | INTRAVENOUS | Status: AC
  Administered 2017-05-30: 0.25 mg via INTRAVENOUS

## 2017-05-30 MED ORDER — SODIUM CHLORIDE 0.9% FLUSH
10.0000 mL | INTRAVENOUS | Status: DC | PRN
  Administered 2017-05-30: 10 mL
  Filled 2017-05-30: qty 10

## 2017-05-30 MED ORDER — FAMOTIDINE IN NACL 20-0.9 MG/50ML-% IV SOLN
20.0000 mg | Freq: Once | INTRAVENOUS | Status: AC
  Administered 2017-05-30: 20 mg via INTRAVENOUS

## 2017-05-30 MED ORDER — PALONOSETRON HCL INJECTION 0.25 MG/5ML
INTRAVENOUS | Status: AC
  Filled 2017-05-30: qty 5

## 2017-05-30 MED ORDER — DIPHENHYDRAMINE HCL 50 MG/ML IJ SOLN
INTRAMUSCULAR | Status: AC
  Filled 2017-05-30: qty 1

## 2017-05-30 MED ORDER — DIPHENHYDRAMINE HCL 50 MG/ML IJ SOLN
50.0000 mg | Freq: Once | INTRAMUSCULAR | Status: AC
  Administered 2017-05-30: 50 mg via INTRAVENOUS

## 2017-05-30 NOTE — Patient Instructions (Signed)
[facility] Discharge Instructions for Patients Receiving Chemotherapy  Today you received the following chemotherapy agents Paclitaxel  To help prevent nausea and vomiting after your treatment, we encourage you to take your nausea medication as directed   If you develop nausea and vomiting that is not controlled by your nausea medication, call the clinic.   BELOW ARE SYMPTOMS THAT SHOULD BE REPORTED IMMEDIATELY:  *FEVER GREATER THAN 100.5 F  *CHILLS WITH OR WITHOUT FEVER  NAUSEA AND VOMITING THAT IS NOT CONTROLLED WITH YOUR NAUSEA MEDICATION  *UNUSUAL SHORTNESS OF BREATH  *UNUSUAL BRUISING OR BLEEDING  TENDERNESS IN MOUTH AND THROAT WITH OR WITHOUT PRESENCE OF ULCERS  *URINARY PROBLEMS  *BOWEL PROBLEMS  UNUSUAL RASH Items with * indicate a potential emergency and should be followed up as soon as possible.  Feel free to call the clinic should you have any questions or concerns. The clinic phone number is [phone].  Please show the CHEMO ALERT CARD at check-in to the Emergency Department and triage nurse.   

## 2017-05-30 NOTE — Progress Notes (Signed)
Ok to proceed with treatment with 05/30/17 lab results per Dr. Lindi Adie.

## 2017-05-30 NOTE — Progress Notes (Signed)
Patient Care Team: Jettie Booze, NP as PCP - General (Family Medicine) Nicholas Lose, MD as Consulting Physician (Hematology and Oncology) Jovita Kussmaul, MD as Consulting Physician (General Surgery) Gery Pray, MD as Consulting Physician (Radiation Oncology)  DIAGNOSIS:  Encounter Diagnosis  Name Primary?  . 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, estrogen receptor positive ([place])     SUMMARY OF ONCOLOGIC HISTORY:   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, estrogen receptor positive ([place])   01/02/2017 Initial Diagnosis    Palpable right breast mass retroareolar 6:30 position: 3.6 cm size axilla negative, 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 HER-2 negative ratio 1.31 Ki-67 3% in addition calcifications UIQ 1.4 cm stereotactic biopsy flat epithelial atypia; clips are 4.3 cm apart, T2 N0 stage IB clinical stage AJCC 8      01/28/2017 Surgery    Bilateral mastectomies: Right: Grade 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  She has been tolerating Taxol fairly well.  She denies any neuropathy.  Denies any nausea vomiting.  Denies any fevers or chills.  Overall fatigue appears to be improving slowly.  Taste is still poor.  However she still has good appetite.  REVIEW OF SYSTEMS:   Constitutional: Denies fevers, chills or abnormal weight loss Eyes: Denies blurriness of vision Ears,  nose, mouth, throat, and face: Denies mucositis or sore throat Respiratory: Denies cough, dyspnea or wheezes Cardiovascular: Denies palpitation, chest discomfort Gastrointestinal:  Denies nausea, heartburn or change in bowel habits Skin: Denies abnormal skin rashes Lymphatics: Denies new lymphadenopathy or easy bruising Neurological:Denies numbness, tingling or new weaknesses Behavioral/Psych: Mood is stable, no new changes  Extremities: No lower extremity edema Breast:  denies any pain or lumps or nodules in either breasts All other systems were reviewed with the patient and are negative.  I have reviewed the past medical history, past surgical history, social history and family history with the patient and they are unchanged from previous note.  ALLERGIES:  has No Known Allergies.  MEDICATIONS:  Current Outpatient Medications  Medication Sig Dispense Refill  . Acetaminophen (TYLENOL CHILDRENS CHEWABLES PO) Take by mouth as needed.    Marland Kitchen ketotifen (ZADITOR) 0.025 % ophthalmic solution Apply 1 drop to eye daily as needed (allergies).    Marland Kitchen lidocaine-prilocaine (EMLA) cream Apply to affected area once 30 g 3  . LORazepam (ATIVAN) 0.5 MG tablet Take 1 tablet (0.5 mg total) by mouth at bedtime. 30 tablet 0  . magic mouthwash w/lidocaine SOLN Take 5 mLs by mouth 3 (three) times daily as needed for mouth pain. Swish 5-10ML 3 times a day as needed. Swish. Spit or [place]. 240 mL 1  . Multiple Vitamins-Minerals (CENTRUM ADULTS) TABS Take 1 tablet by mouth daily.     . ondansetron (ZOFRAN) 8 MG tablet Take 1 tablet (8 mg total)  by mouth 2 (two) times daily as needed. Start on the third day after chemotherapy. 30 tablet 1  . prochlorperazine (COMPAZINE) 10 MG tablet Take 1 tablet (10 mg total) by mouth every 6 (six) hours as needed (Nausea or vomiting). 30 tablet 1   No current facility-administered medications for this visit.     PHYSICAL EXAMINATION: ECOG PERFORMANCE STATUS: 1 - Symptomatic  but completely ambulatory  Vitals:   05/30/17 0914  BP: 113/80  Pulse: 98  Resp: 16  Temp: 98 F (36.7 C)  SpO2: 100%   Filed Weights   05/30/17 0914  Weight: 118 lb 14.4 oz (53.9 kg)    GENERAL:alert, no distress and comfortable SKIN: skin color, texture, turgor are normal, no rashes or significant lesions EYES: normal, Conjunctiva are pink and non-injected, sclera clear OROPHARYNX:no exudate, no erythema and lips, buccal mucosa, and tongue normal  NECK: supple, thyroid normal size, non-tender, without nodularity LYMPH:  no palpable lymphadenopathy in the cervical, axillary or inguinal LUNGS: clear to auscultation and percussion with normal breathing effort HEART: regular rate & rhythm and no murmurs and no lower extremity edema ABDOMEN:abdomen soft, non-tender and normal bowel sounds MUSCULOSKELETAL:no cyanosis of digits and no clubbing  NEURO: alert & oriented x 3 with fluent speech, no focal motor/sensory deficits EXTREMITIES: No lower extremity edema  LABORATORY DATA:  I have reviewed the data as listed CMP Latest Ref Rng & Units 05/23/2017 05/16/2017 05/09/2017  Glucose 70 - 140 mg/dL 114 106 106  BUN 7 - 26 mg/dL 10 11.5 10.3  Creatinine 0.60 - 1.10 mg/dL 0.78 0.8 0.8  Sodium 136 - 145 mmol/L 139 140 139  Potassium 3.3 - 4.7 mmol/L 3.7 3.7 3.7  Chloride 98 - 109 mmol/L 105 - -  CO2 22 - 29 mmol/L _0 Calcium 8.4 - 10.4 mg/dL 9.3 9.4 9.5  Total Protein 6.4 - 8.3 g/dL 6.7 6.7 6.9  Total Bilirubin 0.2 - 1.2 mg/dL 0.3 0.35 0.26  Alkaline Phos 40 - 150 U/L 54 60 79  AST 5 - 34 U/L 46(H) 47(H) 31  ALT 0 - 55 U/L 60(H) 70(H) 31    Lab Results  Component Value Date   WBC 2.8 (L) 05/30/2017   HGB 11.0 (L) 05/30/2017   HCT 32.4 (L) 05/30/2017   MCV 94.8 05/30/2017   PLT 261 05/30/2017   NEUTROABS 1.7 05/30/2017    ASSESSMENT & PLAN:  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 adjuvant chemotherapy with dose dense Adriamycin and Cytoxan x4 followed by Taxol weekly x12 3. Adjuvant radiation 4. Adjuvant antiestrogen therapy with tamoxifen (which was originally started prior to surgery) ---------------------------------------------------------------------- Current treatment: Completed 4 cycles of dose dense Adriamycin Cytoxan, today cycle 4 Taxol  Chemo toxicities: 1.  Alopecia 2. Fatigue 3.  Decrease neutrophils: Monitoring closely.    They are adequate to treat.    We decreased the dosage of Taxol to 65 mg/m square with cycle 2  Return to clinic  weekly for chemo and every 2 weeks for follow-up with me  I spent 25 minutes talking to the patient of which more than half was spent in counseling and coordination of care.  No orders of the defined types were placed in this encounter.  The patient has a good understanding of the overall plan. she agrees with it. she will call with any problems that may develop before the next visit here.   Harriette Ohara, MD 05/30/17

## 2017-05-31 ENCOUNTER — Telehealth: Payer: Self-pay | Admitting: Hematology and Oncology

## 2017-05-31 NOTE — Telephone Encounter (Signed)
Left message for patient regarding upcoming January through March appointments.

## 2017-06-03 ENCOUNTER — Ambulatory Visit: Payer: BLUE CROSS/BLUE SHIELD

## 2017-06-06 ENCOUNTER — Inpatient Hospital Stay: Payer: BLUE CROSS/BLUE SHIELD

## 2017-06-06 VITALS — BP 126/86 | HR 116 | Temp 99.1°F | Resp 16

## 2017-06-06 DIAGNOSIS — Z170 Estrogen receptor positive status [ER+]: Principal | ICD-10-CM

## 2017-06-06 DIAGNOSIS — C50511 Malignant neoplasm of lower-outer quadrant of right female breast: Secondary | ICD-10-CM

## 2017-06-06 LAB — CBC WITH DIFFERENTIAL/PLATELET
Basophils Absolute: 0.1 10*3/uL (ref 0.0–0.1)
Basophils Relative: 2 %
EOS ABS: 0.1 10*3/uL (ref 0.0–0.5)
Eosinophils Relative: 3 %
HEMATOCRIT: 34.7 % — AB (ref 34.8–46.6)
HEMOGLOBIN: 11.4 g/dL — AB (ref 11.6–15.9)
Lymphocytes Relative: 19 %
Lymphs Abs: 0.7 10*3/uL — ABNORMAL LOW (ref 0.9–3.3)
MCH: 31.8 pg (ref 25.1–34.0)
MCHC: 32.9 g/dL (ref 31.5–36.0)
MCV: 96.7 fL (ref 79.5–101.0)
MONO ABS: 0.4 10*3/uL (ref 0.1–0.9)
MONOS PCT: 12 %
NEUTROS ABS: 2.2 10*3/uL (ref 1.5–6.5)
NEUTROS PCT: 64 %
Platelets: 255 10*3/uL (ref 145–400)
RBC: 3.59 MIL/uL — ABNORMAL LOW (ref 3.70–5.45)
RDW: 14.3 % (ref 11.2–16.1)
WBC: 3.4 10*3/uL — ABNORMAL LOW (ref 3.9–10.3)

## 2017-06-06 LAB — COMPREHENSIVE METABOLIC PANEL
ALK PHOS: 61 U/L (ref 40–150)
ALT: 50 U/L (ref 0–55)
ANION GAP: 10 (ref 3–11)
AST: 41 U/L — ABNORMAL HIGH (ref 5–34)
Albumin: 4 g/dL (ref 3.5–5.0)
BUN: 13 mg/dL (ref 7–26)
CALCIUM: 9.6 mg/dL (ref 8.4–10.4)
CO2: 26 mmol/L (ref 22–29)
Chloride: 103 mmol/L (ref 98–109)
Creatinine, Ser: 0.81 mg/dL (ref 0.60–1.10)
GFR calc non Af Amer: >60 mL/min (ref >60)
Glucose, Bld: 104 mg/dL (ref 70–140)
Potassium: 3.8 mmol/L (ref 3.3–4.7)
Sodium: 139 mmol/L (ref 136–145)
TOTAL PROTEIN: 6.8 g/dL (ref 6.4–8.3)
Total Bilirubin: 0.4 mg/dL (ref 0.2–1.2)

## 2017-06-06 MED ORDER — PALONOSETRON HCL INJECTION 0.25 MG/5ML
0.2500 mg | Freq: Once | INTRAVENOUS | Status: AC
Start: 2017-06-06 — End: 2017-06-06
  Administered 2017-06-06: 0.25 mg via INTRAVENOUS

## 2017-06-06 MED ORDER — DIPHENHYDRAMINE HCL 50 MG/ML IJ SOLN
INTRAMUSCULAR | Status: AC
Start: 2017-06-06 — End: ?
  Filled 2017-06-06: qty 1

## 2017-06-06 MED ORDER — DIPHENHYDRAMINE HCL 50 MG/ML IJ SOLN
50.0000 mg | Freq: Once | INTRAMUSCULAR | Status: AC
  Administered 2017-06-06: 50 mg via INTRAVENOUS

## 2017-06-06 MED ORDER — SODIUM CHLORIDE 0.9 % IV SOLN
Freq: Once | INTRAVENOUS | Status: AC
Start: 2017-06-06 — End: 2017-06-06
  Administered 2017-06-06: 10:00:00 via INTRAVENOUS

## 2017-06-06 MED ORDER — SODIUM CHLORIDE 0.9% FLUSH
10.0000 mL | INTRAVENOUS | Status: DC | PRN
  Administered 2017-06-06: 10 mL via INTRAVENOUS
  Filled 2017-06-06: qty 10

## 2017-06-06 MED ORDER — SODIUM CHLORIDE 0.9 % IV SOLN
65.0000 mg/m2 | Freq: Once | INTRAVENOUS | Status: AC
  Administered 2017-06-06: 102 mg via INTRAVENOUS
  Filled 2017-06-06: qty 17

## 2017-06-06 MED ORDER — SODIUM CHLORIDE 0.9% FLUSH
10.0000 mL | INTRAVENOUS | Status: DC | PRN
  Administered 2017-06-06: 10 mL
  Filled 2017-06-06: qty 10

## 2017-06-06 MED ORDER — DEXAMETHASONE SODIUM PHOSPHATE 10 MG/ML IJ SOLN
INTRAMUSCULAR | Status: AC
  Filled 2017-06-06: qty 1

## 2017-06-06 MED ORDER — DEXAMETHASONE SODIUM PHOSPHATE 10 MG/ML IJ SOLN
10.0000 mg | Freq: Once | INTRAMUSCULAR | Status: AC
Start: 2017-06-06 — End: 2017-06-06
  Administered 2017-06-06: 10 mg via INTRAVENOUS

## 2017-06-06 MED ORDER — FAMOTIDINE IN NACL 20-0.9 MG/50ML-% IV SOLN
INTRAVENOUS | Status: AC
  Filled 2017-06-06: qty 50

## 2017-06-06 MED ORDER — HEPARIN SOD (PORK) LOCK FLUSH 100 UNIT/ML IV SOLN
500.0000 [IU] | Freq: Once | INTRAVENOUS | Status: AC | PRN
  Administered 2017-06-06: 500 [IU]
  Filled 2017-06-06: qty 5

## 2017-06-06 MED ORDER — PALONOSETRON HCL INJECTION 0.25 MG/5ML
INTRAVENOUS | Status: AC
  Filled 2017-06-06: qty 5

## 2017-06-06 MED ORDER — FAMOTIDINE IN NACL 20-0.9 MG/50ML-% IV SOLN
20.0000 mg | Freq: Once | INTRAVENOUS | Status: AC
  Administered 2017-06-06: 20 mg via INTRAVENOUS

## 2017-06-06 NOTE — Patient Instructions (Signed)
[facility] Discharge Instructions for Patients Receiving Chemotherapy  Today you received the following chemotherapy agents :  Taxol.  To help prevent nausea and vomiting after your treatment, we encourage you to take your nausea medication as prescribed.   If you develop nausea and vomiting that is not controlled by your nausea medication, call the clinic.   BELOW ARE SYMPTOMS THAT SHOULD BE REPORTED IMMEDIATELY:  *FEVER GREATER THAN 100.5 F  *CHILLS WITH OR WITHOUT FEVER  NAUSEA AND VOMITING THAT IS NOT CONTROLLED WITH YOUR NAUSEA MEDICATION  *UNUSUAL SHORTNESS OF BREATH  *UNUSUAL BRUISING OR BLEEDING  TENDERNESS IN MOUTH AND THROAT WITH OR WITHOUT PRESENCE OF ULCERS  *URINARY PROBLEMS  *BOWEL PROBLEMS  UNUSUAL RASH Items with * indicate a potential emergency and should be followed up as soon as possible.  Feel free to call the clinic should you have any questions or concerns. The clinic phone number is [phone].  Please show the CHEMO ALERT CARD at check-in to the Emergency Department and triage nurse.   

## 2017-06-12 NOTE — Assessment & Plan Note (Signed)
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 adjuvant chemotherapy with dose dense Adriamycin and Cytoxan x4 followed by Taxol weekly x12 3. Adjuvant radiation 4. Adjuvant antiestrogen therapy with tamoxifen (which was originally started prior to surgery) ---------------------------------------------------------------------- Current treatment: Completed 4 cycles of dose dense Adriamycin Cytoxan, today cycle6Taxol  Chemo toxicities: 1. Alopecia 2. Fatigue 3.  Decrease neutrophils: Monitoring closely.   They are adequate to treat.   We decreased the dosage of Taxol to 65 mg/m square with cycle 2  Return to clinic weekly for chemo and every 2 weeks for follow-up with me

## 2017-06-13 ENCOUNTER — Inpatient Hospital Stay: Payer: BLUE CROSS/BLUE SHIELD

## 2017-06-13 ENCOUNTER — Encounter: Payer: Self-pay | Admitting: *Deleted

## 2017-06-13 ENCOUNTER — Inpatient Hospital Stay (HOSPITAL_BASED_OUTPATIENT_CLINIC_OR_DEPARTMENT_OTHER): Payer: BLUE CROSS/BLUE SHIELD | Admitting: Hematology and Oncology

## 2017-06-13 DIAGNOSIS — Z170 Estrogen receptor positive status [ER+]: Principal | ICD-10-CM

## 2017-06-13 DIAGNOSIS — Z5111 Encounter for antineoplastic chemotherapy: Secondary | ICD-10-CM

## 2017-06-13 DIAGNOSIS — Z9013 Acquired absence of bilateral breasts and nipples: Secondary | ICD-10-CM

## 2017-06-13 DIAGNOSIS — C50511 Malignant neoplasm of lower-outer quadrant of right female breast: Secondary | ICD-10-CM

## 2017-06-13 DIAGNOSIS — R5383 Other fatigue: Secondary | ICD-10-CM

## 2017-06-13 DIAGNOSIS — Z79899 Other long term (current) drug therapy: Secondary | ICD-10-CM

## 2017-06-13 LAB — CBC WITH DIFFERENTIAL/PLATELET
BASOS ABS: 0 10*3/uL (ref 0.0–0.1)
BASOS PCT: 1 %
EOS ABS: 0.1 10*3/uL (ref 0.0–0.5)
Eosinophils Relative: 2 %
HCT: 35.3 % (ref 34.8–46.6)
HEMOGLOBIN: 11.7 g/dL (ref 11.6–15.9)
Lymphocytes Relative: 13 %
Lymphs Abs: 0.8 10*3/uL — ABNORMAL LOW (ref 0.9–3.3)
MCH: 32.1 pg (ref 25.1–34.0)
MCHC: 33.1 g/dL (ref 31.5–36.0)
MCV: 96.7 fL (ref 79.5–101.0)
MONO ABS: 0.5 10*3/uL (ref 0.1–0.9)
MONOS PCT: 8 %
NEUTROS PCT: 76 %
Neutro Abs: 4.3 10*3/uL (ref 1.5–6.5)
Platelets: 238 10*3/uL (ref 145–400)
RBC: 3.65 MIL/uL — ABNORMAL LOW (ref 3.70–5.45)
RDW: 13.5 % (ref 11.2–14.5)
WBC: 5.7 10*3/uL (ref 3.9–10.3)

## 2017-06-13 LAB — COMPREHENSIVE METABOLIC PANEL
ALBUMIN: 4 g/dL (ref 3.5–5.0)
ALT: 34 U/L (ref 0–55)
ANION GAP: 10 (ref 3–11)
AST: 27 U/L (ref 5–34)
Alkaline Phosphatase: 66 U/L (ref 40–150)
BUN: 10 mg/dL (ref 7–26)
CO2: 26 mmol/L (ref 22–29)
Calcium: 9.5 mg/dL (ref 8.4–10.4)
Chloride: 104 mmol/L (ref 98–109)
Creatinine, Ser: 0.84 mg/dL (ref 0.60–1.10)
GFR calc Af Amer: >60 mL/min (ref >60)
GFR calc non Af Amer: >60 mL/min (ref >60)
GLUCOSE: 115 mg/dL (ref 70–140)
Potassium: 3.6 mmol/L (ref 3.5–5.1)
SODIUM: 140 mmol/L (ref 136–145)
Total Bilirubin: 0.3 mg/dL (ref 0.2–1.2)
Total Protein: 7.1 g/dL (ref 6.4–8.3)

## 2017-06-13 MED ORDER — DIPHENHYDRAMINE HCL 50 MG/ML IJ SOLN
INTRAMUSCULAR | Status: AC
  Filled 2017-06-13: qty 1

## 2017-06-13 MED ORDER — DEXAMETHASONE SODIUM PHOSPHATE 10 MG/ML IJ SOLN
INTRAMUSCULAR | Status: AC
  Filled 2017-06-13: qty 1

## 2017-06-13 MED ORDER — HEPARIN SOD (PORK) LOCK FLUSH 100 UNIT/ML IV SOLN
500.0000 [IU] | Freq: Once | INTRAVENOUS | Status: AC | PRN
  Administered 2017-06-13: 500 [IU]
  Filled 2017-06-13: qty 5

## 2017-06-13 MED ORDER — PALONOSETRON HCL INJECTION 0.25 MG/5ML
0.2500 mg | Freq: Once | INTRAVENOUS | Status: AC
  Administered 2017-06-13: 0.25 mg via INTRAVENOUS

## 2017-06-13 MED ORDER — FAMOTIDINE IN NACL 20-0.9 MG/50ML-% IV SOLN
20.0000 mg | Freq: Once | INTRAVENOUS | Status: AC
  Administered 2017-06-13: 20 mg via INTRAVENOUS

## 2017-06-13 MED ORDER — SODIUM CHLORIDE 0.9 % IV SOLN
65.0000 mg/m2 | Freq: Once | INTRAVENOUS | Status: AC
  Administered 2017-06-13: 102 mg via INTRAVENOUS
  Filled 2017-06-13: qty 17

## 2017-06-13 MED ORDER — DOCUSATE SODIUM 50 MG PO CAPS: 50.0000 mg | ORAL_CAPSULE | Freq: Two times a day (BID) | ORAL | 0 refills | Status: DC

## 2017-06-13 MED ORDER — DEXAMETHASONE SODIUM PHOSPHATE 10 MG/ML IJ SOLN
10.0000 mg | Freq: Once | INTRAMUSCULAR | Status: AC
  Administered 2017-06-13: 10 mg via INTRAVENOUS

## 2017-06-13 MED ORDER — FAMOTIDINE IN NACL 20-0.9 MG/50ML-% IV SOLN
INTRAVENOUS | Status: AC
  Filled 2017-06-13: qty 50

## 2017-06-13 MED ORDER — PALONOSETRON HCL INJECTION 0.25 MG/5ML
INTRAVENOUS | Status: AC
  Filled 2017-06-13: qty 5

## 2017-06-13 MED ORDER — DIPHENHYDRAMINE HCL 50 MG/ML IJ SOLN
50.0000 mg | Freq: Once | INTRAMUSCULAR | Status: AC
  Administered 2017-06-13: 50 mg via INTRAVENOUS

## 2017-06-13 MED ORDER — SODIUM CHLORIDE 0.9% FLUSH
10.0000 mL | INTRAVENOUS | Status: DC | PRN
  Administered 2017-06-13: 10 mL
  Filled 2017-06-13: qty 10

## 2017-06-13 MED ORDER — SODIUM CHLORIDE 0.9 % IV SOLN
Freq: Once | INTRAVENOUS | Status: AC
  Administered 2017-06-13: 10:00:00 via INTRAVENOUS

## 2017-06-13 MED ORDER — SODIUM CHLORIDE 0.9% FLUSH
10.0000 mL | INTRAVENOUS | Status: DC | PRN
  Administered 2017-06-13: 10 mL via INTRAVENOUS
  Filled 2017-06-13: qty 10

## 2017-06-13 NOTE — Progress Notes (Signed)
Patient Care Team: Jettie Booze, NP as PCP - General (Family Medicine) Nicholas Lose, MD as Consulting Physician (Hematology and Oncology) Jovita Kussmaul, MD as Consulting Physician (General Surgery) Gery Pray, MD as Consulting Physician (Radiation Oncology)  DIAGNOSIS:  Encounter Diagnosis  Name Primary?  . 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, estrogen receptor positive ([place])     SUMMARY OF ONCOLOGIC HISTORY:   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, estrogen receptor positive ([place])   01/02/2017 Initial Diagnosis    Palpable right breast mass retroareolar 6:30 position: 3.6 cm size axilla negative, 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 HER-2 negative ratio 1.31 Ki-67 3% in addition calcifications UIQ 1.4 cm stereotactic biopsy flat epithelial atypia; clips are 4.3 cm apart, T2 N0 stage IB clinical stage AJCC 8      01/28/2017 Surgery    Bilateral mastectomies: Right: Grade 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%, PR 95%, HER-2 negative ratio 1.31, Ki-67 3%, 4/4 lymph nodes positive; left mastectomy: PASH and FC changes, no malignancy; T2 N2,  stage IIA AJCC 8      02/14/2017 Surgery    Right axillary lymph node dissection 8/11 lymph nodes positive      03/06/2017 -  Chemotherapy    Dose dense AC x4 followed by Taxol x12       CHIEF COMPLIANT: Adjuvant chemotherapy with cycle 6 of Taxol  INTERVAL HISTORY: Angelica Floyd is a 52 year old with above-mentioned history of right breast cancer treated with bilateral mastectomies and had 8+ lymph nodes and is currently on adjuvant chemotherapy with Taxol and today is cycle 6.  She is tolerating Taxol reasonably well.  Does not have any nausea or vomiting.  She continues to have mild to moderate fatigue.  REVIEW OF SYSTEMS:   Constitutional: Denies fevers, chills or abnormal weight loss Eyes: Denies blurriness of vision Ears, nose, mouth, throat, and face: Denies  mucositis or sore throat Respiratory: Denies cough, dyspnea or wheezes Cardiovascular: Denies palpitation, chest discomfort Gastrointestinal:  Denies nausea, heartburn or change in bowel habits Skin: Denies abnormal skin rashes Lymphatics: Denies new lymphadenopathy or easy bruising Neurological:Denies numbness, tingling or new weaknesses Behavioral/Psych: Mood is stable, no new changes  Extremities: No lower extremity edema Breast:  denies any pain or lumps or nodules in either breasts All other systems were reviewed with the patient and are negative.  I have reviewed the past medical history, past surgical history, social history and family history with the patient and they are unchanged from previous note.  ALLERGIES:  has No Known Allergies.  MEDICATIONS:  Current Outpatient Medications  Medication Sig Dispense Refill  . Acetaminophen (TYLENOL CHILDRENS CHEWABLES PO) Take by mouth as needed.    . docusate sodium (COLACE) 50 MG capsule Take 1 capsule (50 mg total) by mouth 2 (two) times daily. 10 capsule 0  . ketotifen (ZADITOR) 0.025 % ophthalmic solution Apply 1 drop to eye daily as needed (allergies).    Marland Kitchen lidocaine-prilocaine (EMLA) cream Apply to affected area once 30 g 3  . LORazepam (ATIVAN) 0.5 MG tablet Take 1 tablet (0.5 mg total) by mouth at bedtime. 30 tablet 0  . magic mouthwash w/lidocaine SOLN Take 5 mLs by mouth 3 (three) times daily as needed for mouth pain. Swish 5-10ML 3 times a day as needed. Swish. Spit or [place]. 240 mL 1  . Multiple Vitamins-Minerals (CENTRUM ADULTS) TABS Take 1 tablet by mouth daily.     Marland Kitchen  ondansetron (ZOFRAN) 8 MG tablet Take 1 tablet (8 mg total) by mouth 2 (two) times daily as needed. Start on the third day after chemotherapy. 30 tablet 1  . prochlorperazine (COMPAZINE) 10 MG tablet Take 1 tablet (10 mg total) by mouth every 6 (six) hours as needed (Nausea or vomiting). 30 tablet 1   No current facility-administered medications for this  visit.     PHYSICAL EXAMINATION: ECOG PERFORMANCE STATUS: 1 - Symptomatic but completely ambulatory  Vitals:   06/13/17 0857  BP: 119/85  Pulse: (!) 126  Resp: 20  Temp: 98.7 F (37.1 C)  SpO2: 100%   Filed Weights   06/13/17 0857  Weight: 119 lb 11.2 oz (54.3 kg)    GENERAL:alert, no distress and comfortable SKIN: skin color, texture, turgor are normal, no rashes or significant lesions EYES: normal, Conjunctiva are pink and non-injected, sclera clear OROPHARYNX:no exudate, no erythema and lips, buccal mucosa, and tongue normal  NECK: supple, thyroid normal size, non-tender, without nodularity LYMPH:  no palpable lymphadenopathy in the cervical, axillary or inguinal LUNGS: clear to auscultation and percussion with normal breathing effort HEART: regular rate & rhythm and no murmurs and no lower extremity edema ABDOMEN:abdomen soft, non-tender and normal bowel sounds MUSCULOSKELETAL:no cyanosis of digits and no clubbing  NEURO: alert & oriented x 3 with fluent speech, no focal motor/sensory deficits EXTREMITIES: No lower extremity edema   LABORATORY DATA:  I have reviewed the data as listed CMP Latest Ref Rng & Units 06/13/2017 06/06/2017 05/30/2017  Glucose 70 - 140 mg/dL 115 104 103  BUN 7 - 26 mg/dL '10 13 11  '$ Creatinine 0.60 - 1.10 mg/dL 0.84 0.81 0.81  Sodium 136 - 145 mmol/L 140 139 140  Potassium 3.5 - 5.1 mmol/L 3.6 3.8 3.8  Chloride 98 - 109 mmol/L 104 103 104  CO2 22 - 29 mmol/L '26 26 26  '$ Calcium 8.4 - 10.4 mg/dL 9.5 9.6 9.5  Total Protein 6.4 - 8.3 g/dL 7.1 6.8 6.9  Total Bilirubin 0.2 - 1.2 mg/dL 0.3 0.4 0.4  Alkaline Phos 40 - 150 U/L 66 61 58  AST 5 - 34 U/L 27 41(H) 55(H)  ALT 0 - 55 U/L 34 50 81(H)    Lab Results  Component Value Date   WBC 5.7 06/13/2017   HGB 11.7 06/13/2017   HCT 35.3 06/13/2017   MCV 96.7 06/13/2017   PLT 238 06/13/2017   NEUTROABS 4.3 06/13/2017    ASSESSMENT & PLAN:  Malignant neoplasm of lower-outer quadrant of right  breast of female, estrogen receptor positive ([place]) 01/28/2017: Bilateral mastectomies: Right: 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 adjuvant chemotherapy with dose dense Adriamycin and Cytoxan x4 followed by Taxol weekly x12 3. Adjuvant radiation 4. Adjuvant antiestrogen therapy with tamoxifen (which was originally started prior to surgery) ---------------------------------------------------------------------- Current treatment: Completed 4 cycles of dose dense Adriamycin Cytoxan, today cycle6Taxol  Chemo toxicities: 1. Alopecia 2. Fatigue 3.  Decrease neutrophils:  we decreased the dosage of Taxol to 65 mg/m square with cycle 2 The previous neutropenia issue has resolved.  Return to  clinic weekly for chemo and every 2 weeks for follow-up with me   I spent 25 minutes talking to the patient of which more than half was spent in counseling and coordination of care.  No orders of the defined types were placed in this encounter.  The patient has a good understanding of the overall plan. she agrees with it. she will call with any problems that may develop before the next visit here.   Harriette Ohara, MD 06/13/17

## 2017-06-13 NOTE — Patient Instructions (Signed)
[facility] Discharge Instructions for Patients Receiving Chemotherapy  Today you received the following chemotherapy agents :  Taxol.  To help prevent nausea and vomiting after your treatment, we encourage you to take your nausea medication as prescribed.   If you develop nausea and vomiting that is not controlled by your nausea medication, call the clinic.   BELOW ARE SYMPTOMS THAT SHOULD BE REPORTED IMMEDIATELY:  *FEVER GREATER THAN 100.5 F  *CHILLS WITH OR WITHOUT FEVER  NAUSEA AND VOMITING THAT IS NOT CONTROLLED WITH YOUR NAUSEA MEDICATION  *UNUSUAL SHORTNESS OF BREATH  *UNUSUAL BRUISING OR BLEEDING  TENDERNESS IN MOUTH AND THROAT WITH OR WITHOUT PRESENCE OF ULCERS  *URINARY PROBLEMS  *BOWEL PROBLEMS  UNUSUAL RASH Items with * indicate a potential emergency and should be followed up as soon as possible.  Feel free to call the clinic should you have any questions or concerns. The clinic phone number is [phone].  Please show the CHEMO ALERT CARD at check-in to the Emergency Department and triage nurse.   

## 2017-06-20 ENCOUNTER — Inpatient Hospital Stay: Payer: BLUE CROSS/BLUE SHIELD

## 2017-06-20 ENCOUNTER — Inpatient Hospital Stay: Payer: BLUE CROSS/BLUE SHIELD | Attending: Hematology and Oncology

## 2017-06-20 VITALS — BP 135/87 | HR 85 | Temp 98.4°F | Resp 18

## 2017-06-20 DIAGNOSIS — M899 Disorder of bone, unspecified: Secondary | ICD-10-CM | POA: Insufficient documentation

## 2017-06-20 DIAGNOSIS — R5383 Other fatigue: Secondary | ICD-10-CM | POA: Insufficient documentation

## 2017-06-20 DIAGNOSIS — Z9013 Acquired absence of bilateral breasts and nipples: Secondary | ICD-10-CM | POA: Diagnosis not present

## 2017-06-20 DIAGNOSIS — R918 Other nonspecific abnormal finding of lung field: Secondary | ICD-10-CM | POA: Insufficient documentation

## 2017-06-20 DIAGNOSIS — C50511 Malignant neoplasm of lower-outer quadrant of right female breast: Secondary | ICD-10-CM

## 2017-06-20 DIAGNOSIS — Z9221 Personal history of antineoplastic chemotherapy: Secondary | ICD-10-CM | POA: Insufficient documentation

## 2017-06-20 DIAGNOSIS — N83209 Unspecified ovarian cyst, unspecified side: Secondary | ICD-10-CM | POA: Diagnosis not present

## 2017-06-20 DIAGNOSIS — Z170 Estrogen receptor positive status [ER+]: Secondary | ICD-10-CM | POA: Insufficient documentation

## 2017-06-20 DIAGNOSIS — K769 Liver disease, unspecified: Secondary | ICD-10-CM | POA: Insufficient documentation

## 2017-06-20 DIAGNOSIS — Z5111 Encounter for antineoplastic chemotherapy: Secondary | ICD-10-CM | POA: Insufficient documentation

## 2017-06-20 DIAGNOSIS — Z79899 Other long term (current) drug therapy: Secondary | ICD-10-CM | POA: Diagnosis not present

## 2017-06-20 LAB — CBC WITH DIFFERENTIAL/PLATELET
Basophils Absolute: 0 10*3/uL (ref 0.0–0.1)
Basophils Relative: 1 %
EOS PCT: 3 %
Eosinophils Absolute: 0.1 10*3/uL (ref 0.0–0.5)
HCT: 34 % — ABNORMAL LOW (ref 34.8–46.6)
Hemoglobin: 11.1 g/dL — ABNORMAL LOW (ref 11.6–15.9)
LYMPHS ABS: 0.7 10*3/uL — AB (ref 0.9–3.3)
Lymphocytes Relative: 22 %
MCH: 31.7 pg (ref 25.1–34.0)
MCHC: 32.6 g/dL (ref 31.5–36.0)
MCV: 97.1 fL (ref 79.5–101.0)
MONO ABS: 0.3 10*3/uL (ref 0.1–0.9)
MONOS PCT: 10 %
Neutro Abs: 2 10*3/uL (ref 1.5–6.5)
Neutrophils Relative %: 64 %
PLATELETS: 258 10*3/uL (ref 145–400)
RBC: 3.5 MIL/uL — ABNORMAL LOW (ref 3.70–5.45)
RDW: 13 % (ref 11.2–14.5)
WBC: 3.1 10*3/uL — ABNORMAL LOW (ref 3.9–10.3)

## 2017-06-20 LAB — COMPREHENSIVE METABOLIC PANEL
ALBUMIN: 3.8 g/dL (ref 3.5–5.0)
ALT: 25 U/L (ref 0–55)
AST: 24 U/L (ref 5–34)
Alkaline Phosphatase: 64 U/L (ref 40–150)
Anion gap: 9 (ref 3–11)
BILIRUBIN TOTAL: 0.3 mg/dL (ref 0.2–1.2)
BUN: 11 mg/dL (ref 7–26)
CO2: 27 mmol/L (ref 22–29)
Calcium: 9.4 mg/dL (ref 8.4–10.4)
Chloride: 105 mmol/L (ref 98–109)
Creatinine, Ser: 0.81 mg/dL (ref 0.60–1.10)
GFR calc Af Amer: >60 mL/min (ref >60)
GFR calc non Af Amer: >60 mL/min (ref >60)
GLUCOSE: 110 mg/dL (ref 70–140)
Potassium: 3.6 mmol/L (ref 3.5–5.1)
SODIUM: 141 mmol/L (ref 136–145)
TOTAL PROTEIN: 6.7 g/dL (ref 6.4–8.3)

## 2017-06-20 MED ORDER — DEXAMETHASONE SODIUM PHOSPHATE 10 MG/ML IJ SOLN
INTRAMUSCULAR | Status: AC
  Filled 2017-06-20: qty 1

## 2017-06-20 MED ORDER — PALONOSETRON HCL INJECTION 0.25 MG/5ML
0.2500 mg | Freq: Once | INTRAVENOUS | Status: AC
  Administered 2017-06-20: 0.25 mg via INTRAVENOUS

## 2017-06-20 MED ORDER — SODIUM CHLORIDE 0.9 % IV SOLN
65.0000 mg/m2 | Freq: Once | INTRAVENOUS | Status: AC
  Administered 2017-06-20: 102 mg via INTRAVENOUS
  Filled 2017-06-20: qty 17

## 2017-06-20 MED ORDER — DIPHENHYDRAMINE HCL 50 MG/ML IJ SOLN
INTRAMUSCULAR | Status: AC
  Filled 2017-06-20: qty 1

## 2017-06-20 MED ORDER — FAMOTIDINE IN NACL 20-0.9 MG/50ML-% IV SOLN
20.0000 mg | Freq: Once | INTRAVENOUS | Status: AC
  Administered 2017-06-20: 20 mg via INTRAVENOUS

## 2017-06-20 MED ORDER — SODIUM CHLORIDE 0.9 % IV SOLN
Freq: Once | INTRAVENOUS | Status: AC
  Administered 2017-06-20: 11:00:00 via INTRAVENOUS

## 2017-06-20 MED ORDER — PALONOSETRON HCL INJECTION 0.25 MG/5ML
INTRAVENOUS | Status: AC
  Filled 2017-06-20: qty 5

## 2017-06-20 MED ORDER — HEPARIN SOD (PORK) LOCK FLUSH 100 UNIT/ML IV SOLN
500.0000 [IU] | Freq: Once | INTRAVENOUS | Status: AC | PRN
  Administered 2017-06-20: 500 [IU]
  Filled 2017-06-20: qty 5

## 2017-06-20 MED ORDER — SODIUM CHLORIDE 0.9% FLUSH
10.0000 mL | INTRAVENOUS | Status: DC | PRN
  Administered 2017-06-20: 10 mL via INTRAVENOUS
  Filled 2017-06-20: qty 10

## 2017-06-20 MED ORDER — SODIUM CHLORIDE 0.9% FLUSH
10.0000 mL | INTRAVENOUS | Status: DC | PRN
  Administered 2017-06-20: 10 mL
  Filled 2017-06-20: qty 10

## 2017-06-20 MED ORDER — FAMOTIDINE IN NACL 20-0.9 MG/50ML-% IV SOLN
INTRAVENOUS | Status: AC
  Filled 2017-06-20: qty 50

## 2017-06-20 MED ORDER — DIPHENHYDRAMINE HCL 50 MG/ML IJ SOLN
50.0000 mg | Freq: Once | INTRAMUSCULAR | Status: AC
  Administered 2017-06-20: 50 mg via INTRAVENOUS

## 2017-06-20 MED ORDER — DEXAMETHASONE SODIUM PHOSPHATE 10 MG/ML IJ SOLN
10.0000 mg | Freq: Once | INTRAMUSCULAR | Status: AC
  Administered 2017-06-20: 10 mg via INTRAVENOUS

## 2017-06-20 NOTE — Patient Instructions (Signed)
[facility] Discharge Instructions for Patients Receiving Chemotherapy  Today you received the following chemotherapy agents :  Taxol.  To help prevent nausea and vomiting after your treatment, we encourage you to take your nausea medication as prescribed.   If you develop nausea and vomiting that is not controlled by your nausea medication, call the clinic.   BELOW ARE SYMPTOMS THAT SHOULD BE REPORTED IMMEDIATELY:  *FEVER GREATER THAN 100.5 F  *CHILLS WITH OR WITHOUT FEVER  NAUSEA AND VOMITING THAT IS NOT CONTROLLED WITH YOUR NAUSEA MEDICATION  *UNUSUAL SHORTNESS OF BREATH  *UNUSUAL BRUISING OR BLEEDING  TENDERNESS IN MOUTH AND THROAT WITH OR WITHOUT PRESENCE OF ULCERS  *URINARY PROBLEMS  *BOWEL PROBLEMS  UNUSUAL RASH Items with * indicate a potential emergency and should be followed up as soon as possible.  Feel free to call the clinic should you have any questions or concerns. The clinic phone number is [phone].  Please show the CHEMO ALERT CARD at check-in to the Emergency Department and triage nurse.   

## 2017-06-27 ENCOUNTER — Inpatient Hospital Stay: Payer: BLUE CROSS/BLUE SHIELD

## 2017-06-27 ENCOUNTER — Inpatient Hospital Stay (HOSPITAL_BASED_OUTPATIENT_CLINIC_OR_DEPARTMENT_OTHER): Payer: BLUE CROSS/BLUE SHIELD | Admitting: Hematology and Oncology

## 2017-06-27 DIAGNOSIS — N83209 Unspecified ovarian cyst, unspecified side: Secondary | ICD-10-CM

## 2017-06-27 DIAGNOSIS — R5383 Other fatigue: Secondary | ICD-10-CM | POA: Diagnosis not present

## 2017-06-27 DIAGNOSIS — Z9221 Personal history of antineoplastic chemotherapy: Secondary | ICD-10-CM

## 2017-06-27 DIAGNOSIS — Z170 Estrogen receptor positive status [ER+]: Secondary | ICD-10-CM | POA: Diagnosis not present

## 2017-06-27 DIAGNOSIS — R918 Other nonspecific abnormal finding of lung field: Secondary | ICD-10-CM

## 2017-06-27 DIAGNOSIS — K769 Liver disease, unspecified: Secondary | ICD-10-CM

## 2017-06-27 DIAGNOSIS — M899 Disorder of bone, unspecified: Secondary | ICD-10-CM

## 2017-06-27 DIAGNOSIS — Z79899 Other long term (current) drug therapy: Secondary | ICD-10-CM | POA: Diagnosis not present

## 2017-06-27 DIAGNOSIS — C50511 Malignant neoplasm of lower-outer quadrant of right female breast: Secondary | ICD-10-CM

## 2017-06-27 DIAGNOSIS — Z9013 Acquired absence of bilateral breasts and nipples: Secondary | ICD-10-CM

## 2017-06-27 LAB — CBC WITH DIFFERENTIAL/PLATELET
BASOS ABS: 0 10*3/uL (ref 0.0–0.1)
BASOS PCT: 1 %
EOS ABS: 0.1 10*3/uL (ref 0.0–0.5)
Eosinophils Relative: 3 %
HEMATOCRIT: 35.5 % (ref 34.8–46.6)
HEMOGLOBIN: 11.7 g/dL (ref 11.6–15.9)
Lymphocytes Relative: 19 %
Lymphs Abs: 0.6 10*3/uL — ABNORMAL LOW (ref 0.9–3.3)
MCH: 31.8 pg (ref 25.1–34.0)
MCHC: 33 g/dL (ref 31.5–36.0)
MCV: 96.5 fL (ref 79.5–101.0)
Monocytes Absolute: 0.6 10*3/uL (ref 0.1–0.9)
Monocytes Relative: 19 %
NEUTROS ABS: 1.8 10*3/uL (ref 1.5–6.5)
NEUTROS PCT: 58 %
Platelets: 249 10*3/uL (ref 145–400)
RBC: 3.68 MIL/uL — ABNORMAL LOW (ref 3.70–5.45)
RDW: 12.5 % (ref 11.2–14.5)
WBC: 3.2 10*3/uL — ABNORMAL LOW (ref 3.9–10.3)

## 2017-06-27 LAB — COMPREHENSIVE METABOLIC PANEL
ALK PHOS: 66 U/L (ref 40–150)
ALT: 31 U/L (ref 0–55)
ANION GAP: 8 (ref 3–11)
AST: 26 U/L (ref 5–34)
Albumin: 3.9 g/dL (ref 3.5–5.0)
BILIRUBIN TOTAL: 0.4 mg/dL (ref 0.2–1.2)
BUN: 12 mg/dL (ref 7–26)
CALCIUM: 9.6 mg/dL (ref 8.4–10.4)
CO2: 27 mmol/L (ref 22–29)
Chloride: 104 mmol/L (ref 98–109)
Creatinine, Ser: 0.83 mg/dL (ref 0.60–1.10)
Glucose, Bld: 103 mg/dL (ref 70–140)
Potassium: 3.7 mmol/L (ref 3.5–5.1)
Sodium: 139 mmol/L (ref 136–145)
TOTAL PROTEIN: 6.9 g/dL (ref 6.4–8.3)

## 2017-06-27 MED ORDER — SODIUM CHLORIDE 0.9% FLUSH
10.0000 mL | INTRAVENOUS | Status: DC | PRN
  Administered 2017-06-27: 10 mL
  Filled 2017-06-27: qty 10

## 2017-06-27 MED ORDER — SODIUM CHLORIDE 0.9 % IV SOLN
Freq: Once | INTRAVENOUS | Status: AC
  Administered 2017-06-27: 09:00:00 via INTRAVENOUS

## 2017-06-27 MED ORDER — DEXAMETHASONE SODIUM PHOSPHATE 10 MG/ML IJ SOLN
INTRAMUSCULAR | Status: AC
  Filled 2017-06-27: qty 1

## 2017-06-27 MED ORDER — HEPARIN SOD (PORK) LOCK FLUSH 100 UNIT/ML IV SOLN
500.0000 [IU] | Freq: Once | INTRAVENOUS | Status: AC | PRN
  Administered 2017-06-27: 500 [IU]
  Filled 2017-06-27: qty 5

## 2017-06-27 MED ORDER — PALONOSETRON HCL INJECTION 0.25 MG/5ML
INTRAVENOUS | Status: AC
  Filled 2017-06-27: qty 5

## 2017-06-27 MED ORDER — DIPHENHYDRAMINE HCL 50 MG/ML IJ SOLN
INTRAMUSCULAR | Status: AC
  Filled 2017-06-27: qty 1

## 2017-06-27 MED ORDER — FAMOTIDINE 200 MG/20ML IV SOLN
20.0000 mg | Freq: Once | INTRAVENOUS | Status: AC
  Administered 2017-06-27: 20 mg via INTRAVENOUS
  Filled 2017-06-27: qty 2

## 2017-06-27 MED ORDER — PALONOSETRON HCL INJECTION 0.25 MG/5ML
0.2500 mg | Freq: Once | INTRAVENOUS | Status: AC
  Administered 2017-06-27: 0.25 mg via INTRAVENOUS

## 2017-06-27 MED ORDER — SODIUM CHLORIDE 0.9 % IV SOLN
65.0000 mg/m2 | Freq: Once | INTRAVENOUS | Status: AC
  Administered 2017-06-27: 102 mg via INTRAVENOUS
  Filled 2017-06-27: qty 17

## 2017-06-27 MED ORDER — SODIUM CHLORIDE 0.9% FLUSH
10.0000 mL | INTRAVENOUS | Status: DC | PRN
  Administered 2017-06-27: 10 mL via INTRAVENOUS
  Filled 2017-06-27: qty 10

## 2017-06-27 MED ORDER — FAMOTIDINE IN NACL 20-0.9 MG/50ML-% IV SOLN
20.0000 mg | Freq: Once | INTRAVENOUS | Status: DC
  Filled 2017-06-27: qty 50

## 2017-06-27 MED ORDER — DEXAMETHASONE SODIUM PHOSPHATE 10 MG/ML IJ SOLN
10.0000 mg | Freq: Once | INTRAMUSCULAR | Status: AC
  Administered 2017-06-27: 10 mg via INTRAVENOUS

## 2017-06-27 MED ORDER — DIPHENHYDRAMINE HCL 50 MG/ML IJ SOLN
50.0000 mg | Freq: Once | INTRAMUSCULAR | Status: AC
  Administered 2017-06-27: 50 mg via INTRAVENOUS

## 2017-06-27 NOTE — Patient Instructions (Signed)
[facility] Discharge Instructions for Patients Receiving Chemotherapy  Today you received the following chemotherapy agents :  Taxol.  To help prevent nausea and vomiting after your treatment, we encourage you to take your nausea medication as prescribed.   If you develop nausea and vomiting that is not controlled by your nausea medication, call the clinic.   BELOW ARE SYMPTOMS THAT SHOULD BE REPORTED IMMEDIATELY:  *FEVER GREATER THAN 100.5 F  *CHILLS WITH OR WITHOUT FEVER  NAUSEA AND VOMITING THAT IS NOT CONTROLLED WITH YOUR NAUSEA MEDICATION  *UNUSUAL SHORTNESS OF BREATH  *UNUSUAL BRUISING OR BLEEDING  TENDERNESS IN MOUTH AND THROAT WITH OR WITHOUT PRESENCE OF ULCERS  *URINARY PROBLEMS  *BOWEL PROBLEMS  UNUSUAL RASH Items with * indicate a potential emergency and should be followed up as soon as possible.  Feel free to call the clinic should you have any questions or concerns. The clinic phone number is [phone].  Please show the CHEMO ALERT CARD at check-in to the Emergency Department and triage nurse.   

## 2017-06-27 NOTE — Progress Notes (Signed)
Patient Care Team: Jettie Booze, NP as PCP - General (Family Medicine) Nicholas Lose, MD as Consulting Physician (Hematology and Oncology) Jovita Kussmaul, MD as Consulting Physician (General Surgery) Gery Pray, MD as Consulting Physician (Radiation Oncology)  DIAGNOSIS:  Encounter Diagnosis  Name Primary?  . 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, estrogen receptor positive ([place])     SUMMARY OF ONCOLOGIC HISTORY:   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, estrogen receptor positive ([place])   01/02/2017 Initial Diagnosis    Palpable right breast mass retroareolar 6:30 position: 3.6 cm size axilla negative, 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 HER-2 negative ratio 1.31 Ki-67 3% in addition calcifications UIQ 1.4 cm stereotactic biopsy flat epithelial atypia; clips are 4.3 cm apart, T2 N0 stage IB clinical stage AJCC 8      01/28/2017 Surgery    Bilateral mastectomies: Right: Grade 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%, PR 95%, HER-2 negative ratio 1.31, Ki-67 3%, 4/4 lymph nodes positive; left mastectomy: PASH and FC changes, no malignancy; T2 N2,  stage IIA AJCC 8      02/14/2017 Surgery    Right axillary lymph node dissection 8/11 lymph nodes positive      03/06/2017 -  Chemotherapy    Dose dense AC x4 followed by Taxol x12       CHIEF COMPLIANT: Cycle 8 Taxol  INTERVAL HISTORY: Angelica Floyd is a 52 year old with above-mentioned history of right breast cancer currently on adjuvant chemotherapy and today's cycle 6 of Taxol.  She is tolerating it extremely well.  She has no neuropathy.  Denies any nausea or vomiting.  Taste is excellent.  REVIEW OF SYSTEMS:   Constitutional: Denies fevers, chills or abnormal weight loss Eyes: Denies blurriness of vision Ears, nose, mouth, throat, and face: Denies mucositis or sore throat Respiratory: Denies cough, dyspnea or wheezes Cardiovascular: Denies palpitation, chest  discomfort Gastrointestinal:  Denies nausea, heartburn or change in bowel habits Skin: Denies abnormal skin rashes Lymphatics: Denies new lymphadenopathy or easy bruising Neurological:Denies numbness, tingling or new weaknesses Behavioral/Psych: Mood is stable, no new changes  Extremities: No lower extremity edema Breast:  denies any pain or lumps or nodules in either breasts All other systems were reviewed with the patient and are negative.  I have reviewed the past medical history, past surgical history, social history and family history with the patient and they are unchanged from previous note.  ALLERGIES:  has No Known Allergies.  MEDICATIONS:  Current Outpatient Medications  Medication Sig Dispense Refill  . Acetaminophen (TYLENOL CHILDRENS CHEWABLES PO) Take by mouth as needed.    . docusate sodium (COLACE) 50 MG capsule Take 1 capsule (50 mg total) by mouth 2 (two) times daily. 10 capsule 0  . ketotifen (ZADITOR) 0.025 % ophthalmic solution Apply 1 drop to eye daily as needed (allergies).    Marland Kitchen lidocaine-prilocaine (EMLA) cream Apply to affected area once 30 g 3  . LORazepam (ATIVAN) 0.5 MG tablet Take 1 tablet (0.5 mg total) by mouth at bedtime. 30 tablet 0  . magic mouthwash w/lidocaine SOLN Take 5 mLs by mouth 3 (three) times daily as needed for mouth pain. Swish 5-10ML 3 times a day as needed. Swish. Spit or [place]. 240 mL 1  . Multiple Vitamins-Minerals (CENTRUM ADULTS) TABS Take 1 tablet by mouth daily.     . ondansetron (ZOFRAN) 8 MG tablet Take 1 tablet (8 mg total) by mouth 2 (two)  times daily as needed. Start on the third day after chemotherapy. 30 tablet 1  . prochlorperazine (COMPAZINE) 10 MG tablet Take 1 tablet (10 mg total) by mouth every 6 (six) hours as needed (Nausea or vomiting). 30 tablet 1   No current facility-administered medications for this visit.     PHYSICAL EXAMINATION: ECOG PERFORMANCE STATUS: 1 - Symptomatic but completely ambulatory  Vitals:    06/27/17 0828  BP: 134/84  Pulse: (!) 117  Resp: 17  Temp: 98 F (36.7 C)  SpO2: 100%   Filed Weights   06/27/17 0828  Weight: 120 lb 8 oz (54.7 kg)    GENERAL:alert, no distress and comfortable SKIN: skin color, texture, turgor are normal, no rashes or significant lesions EYES: normal, Conjunctiva are pink and non-injected, sclera clear OROPHARYNX:no exudate, no erythema and lips, buccal mucosa, and tongue normal  NECK: supple, thyroid normal size, non-tender, without nodularity LYMPH:  no palpable lymphadenopathy in the cervical, axillary or inguinal LUNGS: clear to auscultation and percussion with normal breathing effort HEART: regular rate & Angelica and no murmurs and no lower extremity edema ABDOMEN:abdomen soft, non-tender and normal bowel sounds MUSCULOSKELETAL:no cyanosis of digits and no clubbing  NEURO: alert & oriented x 3 with fluent speech, no focal motor/sensory deficits EXTREMITIES: No lower extremity edema   LABORATORY DATA:  I have reviewed the data as listed CMP Latest Ref Rng & Units 06/20/2017 06/13/2017 06/06/2017  Glucose 70 - 140 mg/dL 110 115 104  BUN 7 - 26 mg/dL '11 10 13  '$ Creatinine 0.60 - 1.10 mg/dL 0.81 0.84 0.81  Sodium 136 - 145 mmol/L 141 140 139  Potassium 3.5 - 5.1 mmol/L 3.6 3.6 3.8  Chloride 98 - 109 mmol/L 105 104 103  CO2 22 - 29 mmol/L '27 26 26  '$ Calcium 8.4 - 10.4 mg/dL 9.4 9.5 9.6  Total Protein 6.4 - 8.3 g/dL 6.7 7.1 6.8  Total Bilirubin 0.2 - 1.2 mg/dL 0.3 0.3 0.4  Alkaline Phos 40 - 150 U/L 64 66 61  AST 5 - 34 U/L 24 27 41(H)  ALT 0 - 55 U/L 25 34 50    Lab Results  Component Value Date   WBC 3.2 (L) 06/27/2017   HGB 11.7 06/27/2017   HCT 35.5 06/27/2017   MCV 96.5 06/27/2017   PLT 249 06/27/2017   NEUTROABS 1.8 06/27/2017    ASSESSMENT & PLAN:  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, estrogen receptor positive ([place]) 01/28/2017: Bilateral mastectomies: Right: Grade 1 ILC with LCIS 4.5 cm ER  95%, PR 95%, HER-2 negative ratio 1.31, Ki-67 3%, 4/4 lymph nodes positive; left mastectomy: PASH and FC changes, no malignancy; T2 N2, stage IIA AJCC 8 02-14-17: 8/10 lymph nodes positive  CT chest 02/13/2017: 3 mm right middle lobe nodule likely benign benign cysts in the liver, ovarian cysts few small sclerotic lesions in the bone likely bone islands Bone scan 02/13/2017: No bone metastases  Treatment plan: 1. adjuvant chemotherapy with dose dense Adriamycin and Cytoxan x4 followed by Taxol weekly x12 3. Adjuvant radiation 4. Adjuvant antiestrogen therapy with tamoxifen (which was originally started prior to surgery) ---------------------------------------------------------------------- Current treatment: Completed 4 cycles of dose dense Adriamycin Cytoxan, today cycle8Taxol  Chemo toxicities: 1. Alopecia 2. Fatigue 3.Decrease neutrophils:  we decreasedthe dosage of Taxol to 65 mg/m square with cycle2 The previous neutropenia issue has resolved.  Return to clinicweekly for chemo and every 2 weeks for follow-up with me  I spent 25 minutes talking to  the patient of which more than half was spent in counseling and coordination of care.  No orders of the defined types were placed in this encounter.  The patient has a good understanding of the overall plan. she agrees with it. she will call with any problems that may develop before the next visit here.   Harriette Ohara, MD 06/27/17

## 2017-06-27 NOTE — Assessment & Plan Note (Signed)
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 adjuvant chemotherapy with dose dense Adriamycin and Cytoxan x4 followed by Taxol weekly x12 3. Adjuvant radiation 4. Adjuvant antiestrogen therapy with tamoxifen (which was originally started prior to surgery) ---------------------------------------------------------------------- Current treatment: Completed 4 cycles of dose dense Adriamycin Cytoxan, today cycle8Taxol  Chemo toxicities: 1. Alopecia 2. Fatigue 3.Decrease neutrophils:  we decreasedthe dosage of Taxol to 65 mg/m square with cycle2 The previous neutropenia issue has resolved.  Return to clinicweekly for chemo and every 2 weeks for follow-up with me

## 2017-07-03 ENCOUNTER — Other Ambulatory Visit: Payer: Self-pay

## 2017-07-03 ENCOUNTER — Ambulatory Visit: Payer: BLUE CROSS/BLUE SHIELD | Admitting: Physical Therapy

## 2017-07-03 ENCOUNTER — Ambulatory Visit: Payer: BLUE CROSS/BLUE SHIELD | Attending: Hematology and Oncology | Admitting: Physical Therapy

## 2017-07-03 DIAGNOSIS — Z483 Aftercare following surgery for neoplasm: Secondary | ICD-10-CM | POA: Insufficient documentation

## 2017-07-03 DIAGNOSIS — M25611 Stiffness of right shoulder, not elsewhere classified: Secondary | ICD-10-CM | POA: Insufficient documentation

## 2017-07-03 DIAGNOSIS — R29898 Other symptoms and signs involving the musculoskeletal system: Secondary | ICD-10-CM | POA: Diagnosis present

## 2017-07-03 NOTE — Therapy (Signed)
[place], [place], [address] Phone: [phone]   Fax:  [phone]  Physical Therapy Evaluation  Patient Details  Name: Angelica Floyd MRN: [id number] Date of Birth: 1965-10-26 Referring Provider: Dr. Nicholas Lose   Encounter Date: 07/03/2017  PT End of Session - 07/03/17 1725    Visit Number  1    Number of Visits  13    Date for PT Re-Evaluation  09/02/17    PT Start Time  1436    PT Stop Time  1518    PT Time Calculation (min)  42 min    Activity Tolerance  Patient tolerated treatment well    Behavior During Therapy  [facility] for tasks assessed/performed       Past Medical History:  Diagnosis Date  . Anxiety    since breast cancer  . 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, estrogen receptor positive ([place]) 01/08/2017    Past Surgical History:  Procedure Laterality Date  . ABDOMINAL HYSTERECTOMY    . ADENOIDECTOMY W/ MYRINGOTOMY    . APPENDECTOMY    . AXILLARY LYMPH NODE DISSECTION Right 02/14/2017   Procedure: RIGHT AXILLARY LYMPH NODE DISSECTION ERAS PATHWAY;  Surgeon: Jovita Kussmaul, MD;  Location: [place];  Service: General;  Laterality: Right;  . CESAREAN SECTION    . MASTECTOMY W/ SENTINEL NODE BIOPSY Bilateral 01/28/2017   RIGHT BREAST BIOPSY  . MASTECTOMY W/ SENTINEL NODE BIOPSY Bilateral 01/28/2017   Procedure: BILATERAL MASTECTOMY WITH RIGHT SENTINEL LYMPH NODE BIOPSY;  Surgeon: Jovita Kussmaul, MD;  Location: [place];  Service: General;  Laterality: Bilateral;  . PORTACATH PLACEMENT Left 02/14/2017   Procedure: INSERTION PORT-A-CATH;  Surgeon: Jovita Kussmaul, MD;  Location: [place];  Service: General;  Laterality: Left;  . RE-EXCISION OF BREAST CANCER,SUPERIOR MARGINS N/A 02/14/2017   Procedure: RE-EXCISION INFERIOR FLAP;  Surgeon: Jovita Kussmaul, MD;  Location: [place];  Service: General;  Laterality: N/A;  . WISDOM TOOTH EXTRACTION      There were no vitals  filed for this visit.   Subjective Assessment - 07/03/17 1437    Subjective  My ROM is good but I have cords.  I have radiation starting in April and I'd like to get rid of them. The surgeon offered to take me in to snip them or stretch them, but I don't really want to have surgery    Pertinent History  Bilateral mastectomy for right hormone receptive breast cancer with right SLNB 01/28/17 followed by re-excision and ALND 02/14/17. Has four more weeks of chemotherapy. Will start radiation in April. Otherwise healthy.    Patient Stated Goals  Get ahead of things before radiation and decrease cording    Currently in Pain?  No/denies         [facility] PT Assessment - 07/03/17 0001      Assessment   Medical Diagnosis  right breast cancer    Referring Provider  Dr. Nicholas Lose    Onset Date/Surgical Date  01/28/17 and 02/14/17    Hand Dominance  Right    Prior Therapy  none      Precautions   Precautions  Other (comment)    Precaution Comments  cancer precautions; still on chemo      Restrictions   Weight Bearing Restrictions  No      Balance Screen   Has the patient fallen in the past 6 months  No    Has the patient  had a decrease in activity level because of a fear of falling?   No    Is the patient reluctant to leave their home because of a fear of falling?   No      Home Environment   Living Environment  Private residence    Living Arrangements  Spouse/significant other;Children Husband and 71 and 32 y.o. 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    Leisure  She is walking 3-4 miles per day currently; does strength ABC program every other day, but does shoulder stretches most days of the week      Cognition   Overall Cognitive Status  Within Functional Limits for tasks assessed       AROM   Right Shoulder Flexion  132 Degrees in sitting; feels pulling; some cording visible    Right Shoulder ABduction  128 Degrees cording very easily visible    Right Shoulder Internal Rotation  72 Degrees in supine    Right Shoulder External Rotation  90 Degrees    Left Shoulder Flexion  152 Degrees    Left Shoulder ABduction  160 Degrees    Left Shoulder Internal Rotation  68 Degrees    Left Shoulder External Rotation  90 Degrees      Palpation   Palpation comment  3 cords visible and palpable at right axilla with arm in abduction; one cord is palpable to about 1/2 way down upper arm; decreased soft tissue mobility at right chest also felt      Ambulation/Gait   Ambulation/Gait  Yes             Objective measurements completed on examination: See above findings.      [place] Adult PT Treatment/Exercise - 07/03/17 0001      Manual Therapy   Soft tissue mobilization  to cords at right axilla couple of small pops felt    Myofascial Release  crosshands across right axilla with focus on trying to release cording; also crosshands from right upper arm to left abdomen (but skin was slippery)--those all done in supine; in left sidelying, right shoulder abduction with attempted release, but pt. was limited by pain for this pt. with tenderness at cording             PT Education - 07/03/17 1518    Education provided  Yes    Education Details  instructed in standing right shoulder abduction with left truncal sidebend ("I'm a little teapot"), 30 seconds x 2 at point of tolerable stretch    Person(s) Educated  Patient    Methods  Explanation;Demonstration    Comprehension  Verbalized understanding       PT Short Term Goals - 07/03/17 1732      PT SHORT TERM GOAL #1   Title  Pt. will report perception of at least 30% decrease in right axillary cording.    Time  4    Period  Weeks    Status  New        PT Long Term Goals - 07/03/17 1731      PT LONG TERM GOAL  #1   Title  Pt. will report perception of at least 60% decrease in right axillary cording.  Time  8    Period  Weeks    Status  New      PT LONG TERM GOAL #2   Title  Pt. will be independent in HEP with stretches to minimize cording including right shoulder abduction.    Time  8    Period  Weeks    Status  New      PT LONG TERM GOAL #3   Title  right shoulder active flexion and abduction to at least 145 degrees each    Time  8    Period  [facility] Goals - 01/09/17 1430      Patient will be able to verbalize understanding of pertinent lymphedema risk reduction practices relevant to her diagnosis specifically related to skin care.   Time  1    Period  Days    Status  Achieved      Patient will be able to return demonstrate and/or verbalize understanding of the post-op home exercise program related to regaining shoulder range of motion.   Time  1    Period  Days    Status  Achieved      Patient will be able to verbalize understanding of the importance of attending the postoperative After Breast Cancer Class for further lymphedema risk reduction education and therapeutic exercise.   Time  1    Period  Days    Status  Achieved       Long Term Clinic Goals - 05/27/17 1226      CC Long Term Goal  #1   Title  Patient will be independent in her home exercise program for improving shoulder ROM.    Baseline  Progressed HEP to include Strength ABC Program today-05/13/17; pt is now consistent with all HEP-05/27/17    Status  Achieved      CC Long Term Goal  #2   Title  Incease bilateral shoulder active flexion to >/= 130 degrees for increased ease reaching.    Baseline  Rt 110 degrees (cording very limiting) and Lt 133 degrees=04/02/17; Rt 138 and Lt 151 degrees-05/13/17; Rt 147 and Lt 155 degrees-05/27/17    Status  Achieved      CC Long Term Goal  #3   Title  Incease bilateral shoulder active abduction to >/= 130 degrees for increased ease  reaching.    Baseline  Rt 103 degrees (cording very limiting) and Lt 134 degrees-04/02/17; Rt 140 and Lt 153 degrees-05/13/17; Rt 144 and Lt 166 degrees-05/27/17    Status  Achieved      CC Long Term Goal  #4   Title  Patient will report she has returned to running without increased edema in her right lateral trunk.    Baseline  Did run a half of the Thanksgiving [place] trot, but hasn't run since last chemo due to cold; Pt has gone running a few times up to 3 miles and reports this feeling good-05/13/17; Pt has been walk/running as she feels able now and reports no problems with this-05/27/17    Status  Achieved      CC Long Term Goal  #5   Title  Patient will verbalize understanding of lymphedema risk reduction practices.    Status  Achieved          Plan - 07/03/17 1726    Clinical Impression Statement  Pt. who is known to this clinic and was discharged a month ago returns  now with goal of decreasing cording in right axilla prior to starting radiation treatment in April.  She has four more weeks of once a week chemo, so will plan to see her once a week for PT during that time; once chemo is completed, she may want to increase PT frequency to 2x/week, depending on the status of the cording then. She does have significant tenderness at the largest cord in right axilla with pressure on that area. She had bilateral mastectomy for right breast cancer.     Clinical Decision Making  Low    Rehab Potential  Excellent    Clinical Impairments Affecting Rehab Potential  currently undergoing chemo    PT Frequency  1x / week    PT Duration  4 weeks then possibly 2x/week x 4 weeks    PT Treatment/Interventions  ADLs/Self Care Home Management;Moist Heat;Therapeutic exercise;Patient/family education;Manual techniques;Manual lymph drainage;Scar mobilization;Passive range of motion;Taping    PT Next Visit Plan  Manual work to reduce cording: soft tissue mobilization, myofascial release.    PT Home Exercise  Plan  strength ABC program, right shoulder abduction stretch    Consulted and Agree with Plan of Care  Patient       Patient will benefit from skilled therapeutic intervention in order to improve the following deficits and impairments:  Decreased range of motion, Pain, Increased fascial restricitons  Visit Diagnosis: Stiffness of right shoulder, not elsewhere classified - Plan: PT plan of care cert/re-cert  Aftercare following surgery for neoplasm - Plan: PT plan of care cert/re-cert  Other symptoms and signs involving the musculoskeletal system - Plan: PT plan of care cert/re-cert     Problem List Patient Active Problem List   Diagnosis Date Noted  . Cancer of central portion of right female breast ([place]) 01/28/2017  . 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, estrogen receptor positive ([place]) 01/08/2017    Angelica Floyd 07/03/2017, 5:37 PM  [place], [place], [address] Phone: [phone]   Fax:  [phone]  Name: Angelica Floyd MRN: [id number] Date of Birth: 05/21/1965  Serafina Royals, PT 07/03/17 5:37 PM

## 2017-07-04 ENCOUNTER — Inpatient Hospital Stay: Payer: BLUE CROSS/BLUE SHIELD

## 2017-07-04 VITALS — BP 123/75 | HR 84 | Temp 98.1°F | Resp 18

## 2017-07-04 DIAGNOSIS — C50511 Malignant neoplasm of lower-outer quadrant of right female breast: Secondary | ICD-10-CM | POA: Diagnosis not present

## 2017-07-04 DIAGNOSIS — Z170 Estrogen receptor positive status [ER+]: Principal | ICD-10-CM

## 2017-07-04 LAB — CBC WITH DIFFERENTIAL/PLATELET
BASOS ABS: 0 10*3/uL (ref 0.0–0.1)
Basophils Relative: 1 %
Eosinophils Absolute: 0.1 10*3/uL (ref 0.0–0.5)
Eosinophils Relative: 2 %
HEMATOCRIT: 35.2 % (ref 34.8–46.6)
HEMOGLOBIN: 11.8 g/dL (ref 11.6–15.9)
LYMPHS PCT: 24 %
Lymphs Abs: 0.7 10*3/uL — ABNORMAL LOW (ref 0.9–3.3)
MCH: 32 pg (ref 25.1–34.0)
MCHC: 33.5 g/dL (ref 31.5–36.0)
MCV: 95.4 fL (ref 79.5–101.0)
MONO ABS: 0.4 10*3/uL (ref 0.1–0.9)
Monocytes Relative: 12 %
NEUTROS ABS: 1.8 10*3/uL (ref 1.5–6.5)
NRBC: 0 /100{WBCs}
Neutrophils Relative %: 61 %
Platelets: 229 10*3/uL (ref 145–400)
RBC: 3.69 MIL/uL — AB (ref 3.70–5.45)
RDW: 12.3 % (ref 11.2–14.5)
WBC: 2.9 10*3/uL — ABNORMAL LOW (ref 3.9–10.3)

## 2017-07-04 LAB — COMPREHENSIVE METABOLIC PANEL
ALBUMIN: 3.8 g/dL (ref 3.5–5.0)
ALT: 31 U/L (ref 0–55)
ANION GAP: 8 (ref 3–11)
AST: 25 U/L (ref 5–34)
Alkaline Phosphatase: 64 U/L (ref 40–150)
BILIRUBIN TOTAL: 0.3 mg/dL (ref 0.2–1.2)
BUN: 12 mg/dL (ref 7–26)
CO2: 27 mmol/L (ref 22–29)
Calcium: 9.8 mg/dL (ref 8.4–10.4)
Chloride: 104 mmol/L (ref 98–109)
Creatinine, Ser: 0.84 mg/dL (ref 0.60–1.10)
GFR calc Af Amer: >60 mL/min (ref >60)
GFR calc non Af Amer: >60 mL/min (ref >60)
GLUCOSE: 116 mg/dL (ref 70–140)
POTASSIUM: 3.6 mmol/L (ref 3.5–5.1)
Sodium: 139 mmol/L (ref 136–145)
TOTAL PROTEIN: 6.8 g/dL (ref 6.4–8.3)

## 2017-07-04 MED ORDER — FAMOTIDINE IN NACL 20-0.9 MG/50ML-% IV SOLN: 20.0000 mg | Freq: Once | INTRAVENOUS | Status: DC

## 2017-07-04 MED ORDER — PALONOSETRON HCL INJECTION 0.25 MG/5ML
INTRAVENOUS | Status: AC
  Filled 2017-07-04: qty 5

## 2017-07-04 MED ORDER — COLD PACK MISC ONCOLOGY
1.0000 | Freq: Once | Status: DC | PRN
  Filled 2017-07-04: qty 1

## 2017-07-04 MED ORDER — HEPARIN SOD (PORK) LOCK FLUSH 100 UNIT/ML IV SOLN
500.0000 [IU] | Freq: Once | INTRAVENOUS | Status: AC | PRN
  Administered 2017-07-04: 500 [IU]
  Filled 2017-07-04: qty 5

## 2017-07-04 MED ORDER — DEXAMETHASONE SODIUM PHOSPHATE 10 MG/ML IJ SOLN
INTRAMUSCULAR | Status: AC
  Filled 2017-07-04: qty 1

## 2017-07-04 MED ORDER — SODIUM CHLORIDE 0.9% FLUSH
10.0000 mL | INTRAVENOUS | Status: DC | PRN
  Administered 2017-07-04: 10 mL via INTRAVENOUS
  Filled 2017-07-04: qty 10

## 2017-07-04 MED ORDER — SODIUM CHLORIDE 0.9 % IV SOLN
Freq: Once | INTRAVENOUS | Status: AC
Start: 2017-07-04 — End: 2017-07-04
  Administered 2017-07-04: 09:00:00 via INTRAVENOUS

## 2017-07-04 MED ORDER — SODIUM CHLORIDE 0.9 % IV SOLN
20.0000 mg | Freq: Once | INTRAVENOUS | Status: AC
  Administered 2017-07-04: 20 mg via INTRAVENOUS
  Filled 2017-07-04: qty 2

## 2017-07-04 MED ORDER — PALONOSETRON HCL INJECTION 0.25 MG/5ML
0.2500 mg | Freq: Once | INTRAVENOUS | Status: AC
  Administered 2017-07-04: 0.25 mg via INTRAVENOUS

## 2017-07-04 MED ORDER — SODIUM CHLORIDE 0.9% FLUSH
10.0000 mL | INTRAVENOUS | Status: DC | PRN
  Administered 2017-07-04: 10 mL
  Filled 2017-07-04: qty 10

## 2017-07-04 MED ORDER — DEXAMETHASONE SODIUM PHOSPHATE 10 MG/ML IJ SOLN
10.0000 mg | Freq: Once | INTRAMUSCULAR | Status: AC
  Administered 2017-07-04: 10 mg via INTRAVENOUS

## 2017-07-04 MED ORDER — DIPHENHYDRAMINE HCL 50 MG/ML IJ SOLN
50.0000 mg | Freq: Once | INTRAMUSCULAR | Status: AC
  Administered 2017-07-04: 50 mg via INTRAVENOUS

## 2017-07-04 MED ORDER — SODIUM CHLORIDE 0.9 % IV SOLN
65.0000 mg/m2 | Freq: Once | INTRAVENOUS | Status: AC
  Administered 2017-07-04: 102 mg via INTRAVENOUS
  Filled 2017-07-04: qty 17

## 2017-07-04 MED ORDER — DIPHENHYDRAMINE HCL 50 MG/ML IJ SOLN
INTRAMUSCULAR | Status: AC
  Filled 2017-07-04: qty 1

## 2017-07-04 NOTE — Patient Instructions (Signed)
[facility] Discharge Instructions for Patients Receiving Chemotherapy  Today you received the following chemotherapy agents Taxol  To help prevent nausea and vomiting after your treatment, we encourage you to take your nausea medication.    If you develop nausea and vomiting that is not controlled by your nausea medication, call the clinic.   BELOW ARE SYMPTOMS THAT SHOULD BE REPORTED IMMEDIATELY:  *FEVER GREATER THAN 100.5 F  *CHILLS WITH OR WITHOUT FEVER  NAUSEA AND VOMITING THAT IS NOT CONTROLLED WITH YOUR NAUSEA MEDICATION  *UNUSUAL SHORTNESS OF BREATH  *UNUSUAL BRUISING OR BLEEDING  TENDERNESS IN MOUTH AND THROAT WITH OR WITHOUT PRESENCE OF ULCERS  *URINARY PROBLEMS  *BOWEL PROBLEMS  UNUSUAL RASH Items with * indicate a potential emergency and should be followed up as soon as possible.  Feel free to call the clinic should you have any questions or concerns. The clinic phone number is [phone].  Please show the CHEMO ALERT CARD at check-in to the Emergency Department and triage nurse.   

## 2017-07-04 NOTE — Progress Notes (Signed)
Received order this am with OK to treat per Dr Lindi Adie despite slightly low WBC.  Order repeated & verified.

## 2017-07-08 ENCOUNTER — Ambulatory Visit: Payer: BLUE CROSS/BLUE SHIELD | Admitting: Physical Therapy

## 2017-07-09 ENCOUNTER — Ambulatory Visit: Payer: BLUE CROSS/BLUE SHIELD

## 2017-07-09 DIAGNOSIS — Z483 Aftercare following surgery for neoplasm: Secondary | ICD-10-CM

## 2017-07-09 DIAGNOSIS — R29898 Other symptoms and signs involving the musculoskeletal system: Secondary | ICD-10-CM

## 2017-07-09 DIAGNOSIS — M25611 Stiffness of right shoulder, not elsewhere classified: Secondary | ICD-10-CM

## 2017-07-09 NOTE — Therapy (Signed)
[place], [place], [address] Phone: [phone]   Fax:  [phone]  Physical Therapy Treatment  Patient Details  Name: Angelica Floyd MRN: [id number] Date of Birth: 08/21/1965 Referring Provider: Dr. Nicholas Lose   Encounter Date: 07/09/2017  PT End of Session - 07/09/17 1016    Visit Number  2    Number of Visits  13    Date for PT Re-Evaluation  09/02/17    PT Start Time  0930    PT Stop Time  1015    PT Time Calculation (min)  45 min    Activity Tolerance  Patient tolerated treatment well    Behavior During Therapy  [facility] for tasks assessed/performed       Past Medical History:  Diagnosis Date  . Anxiety    since breast cancer  . 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, estrogen receptor positive ([place]) 01/08/2017    Past Surgical History:  Procedure Laterality Date  . ABDOMINAL HYSTERECTOMY    . ADENOIDECTOMY W/ MYRINGOTOMY    . APPENDECTOMY    . AXILLARY LYMPH NODE DISSECTION Right 02/14/2017   Procedure: RIGHT AXILLARY LYMPH NODE DISSECTION ERAS PATHWAY;  Surgeon: Jovita Kussmaul, MD;  Location: [place];  Service: General;  Laterality: Right;  . CESAREAN SECTION    . MASTECTOMY W/ SENTINEL NODE BIOPSY Bilateral 01/28/2017   RIGHT BREAST BIOPSY  . MASTECTOMY W/ SENTINEL NODE BIOPSY Bilateral 01/28/2017   Procedure: BILATERAL MASTECTOMY WITH RIGHT SENTINEL LYMPH NODE BIOPSY;  Surgeon: Jovita Kussmaul, MD;  Location: [place];  Service: General;  Laterality: Bilateral;  . PORTACATH PLACEMENT Left 02/14/2017   Procedure: INSERTION PORT-A-CATH;  Surgeon: Jovita Kussmaul, MD;  Location: [place];  Service: General;  Laterality: Left;  . RE-EXCISION OF BREAST CANCER,SUPERIOR MARGINS N/A 02/14/2017   Procedure: RE-EXCISION INFERIOR FLAP;  Surgeon: Jovita Kussmaul, MD;  Location: [place];  Service: General;  Laterality: N/A;  . WISDOM TOOTH EXTRACTION      There were no vitals  filed for this visit.  Subjective Assessment - 07/09/17 0933    Subjective  Just feeling tight in my Rt axilla.     Pertinent History  Bilateral mastectomy for right hormone receptive breast cancer with right SLNB 01/28/17 followed by re-excision and ALND 02/14/17. Has four more weeks of chemotherapy. Will start radiation in April. Otherwise healthy.    Patient Stated Goals  Get ahead of things before radiation and decrease cording    Currently in Pain?  No/denies                      [facility] Adult PT Treatment/Exercise - 07/09/17 0001      Manual Therapy   Soft tissue mobilization  to cords at right axilla    Myofascial Release  crosshands across right axilla with focus on trying to release cording; also crosshands from right upper arm to left abdomen (but skin was slippery)--those all done in supine; in left sidelying, right shoulder abduction with attempted release, but pt. was limited by pain for this; had pt in Lt low trunk rotation briefly as well for further myofascial release tenderness at cording               PT Short Term Goals - 07/03/17 1732      PT SHORT TERM GOAL #1   Title  Pt. will report perception of at least 30% decrease in right  axillary cording.    Time  4    Period  Weeks    Status  New        PT Long Term Goals - 07/03/17 1731      PT LONG TERM GOAL #1   Title  Pt. will report perception of at least 60% decrease in right axillary cording.    Time  8    Period  Weeks    Status  New      PT LONG TERM GOAL #2   Title  Pt. will be independent in HEP with stretches to minimize cording including right shoulder abduction.    Time  8    Period  Weeks    Status  New      PT LONG TERM GOAL #3   Title  right shoulder active flexion and abduction to at least 145 degrees each    Time  8    Period  [facility] Goals - 01/09/17 1430      Patient will be able to verbalize understanding of pertinent lymphedema  risk reduction practices relevant to her diagnosis specifically related to skin care.   Time  1    Period  Days    Status  Achieved      Patient will be able to return demonstrate and/or verbalize understanding of the post-op home exercise program related to regaining shoulder range of motion.   Time  1    Period  Days    Status  Achieved      Patient will be able to verbalize understanding of the importance of attending the postoperative After Breast Cancer Class for further lymphedema risk reduction education and therapeutic exercise.   Time  1    Period  Days    Status  Achieved       Long Term Clinic Goals - 05/27/17 1226      CC Long Term Goal  #1   Title  Patient will be independent in her home exercise program for improving shoulder ROM.    Baseline  Progressed HEP to include Strength ABC Program today-05/13/17; pt is now consistent with all HEP-05/27/17    Status  Achieved      CC Long Term Goal  #2   Title  Incease bilateral shoulder active flexion to >/= 130 degrees for increased ease reaching.    Baseline  Rt 110 degrees (cording very limiting) and Lt 133 degrees=04/02/17; Rt 138 and Lt 151 degrees-05/13/17; Rt 147 and Lt 155 degrees-05/27/17    Status  Achieved      CC Long Term Goal  #3   Title  Incease bilateral shoulder active abduction to >/= 130 degrees for increased ease reaching.    Baseline  Rt 103 degrees (cording very limiting) and Lt 134 degrees-04/02/17; Rt 140 and Lt 153 degrees-05/13/17; Rt 144 and Lt 166 degrees-05/27/17    Status  Achieved      CC Long Term Goal  #4   Title  Patient will report she has returned to running without increased edema in her right lateral trunk.    Baseline  Did run a half of the Thanksgiving [place] trot, but hasn't run since last chemo due to cold; Pt has gone running a few times up to 3 miles and reports this feeling good-05/13/17; Pt has been walk/running as she feels able now and reports no problems with this-05/27/17     Status  Achieved      CC Long Term Goal  #5   Title  Patient will verbalize understanding of lymphedema risk reduction practices.    Status  Achieved         Plan - 07/09/17 1016    Clinical Impression Statement  Focused on decreasing cording through maual therapy techniques as pt has continued with HEP that she was issued during last episode with [place]. Did not feel any pops of the cording today but definitely had slow, releases as her end P/ROM had improved visibly by end of session. Also cording was less tender in areas. Pt reported feeling looser at end of session as well.     Rehab Potential  Excellent    Clinical Impairments Affecting Rehab Potential  currently undergoing chemo    PT Frequency  1x / week    PT Duration  4 weeks then possibly 2x/ week for 4 wks    PT Treatment/Interventions  ADLs/Self Care Home Management;Moist Heat;Therapeutic exercise;Patient/family education;Manual techniques;Manual lymph drainage;Scar mobilization;Passive range of motion;Taping    PT Next Visit Plan  Manual work to reduce cording: soft tissue mobilization, myofascial release.    Consulted and Agree with Plan of Care  Patient       Patient will benefit from skilled therapeutic intervention in order to improve the following deficits and impairments:  Decreased range of motion, Pain, Increased fascial restricitons  Visit Diagnosis: Stiffness of right shoulder, not elsewhere classified  Aftercare following surgery for neoplasm  Other symptoms and signs involving the musculoskeletal system     Problem List Patient Active Problem List   Diagnosis Date Noted  . Cancer of central portion of right female breast ([place]) 01/28/2017  . 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, estrogen receptor positive ([place]) 01/08/2017    Otelia Limes, PTA 07/09/2017, 10:19 AM  [place],  [place], [address] Phone: [phone]   Fax:  [phone]  Name: TOMECA HELM MRN: [id number] Date of Birth: Apr 04, 1966

## 2017-07-11 ENCOUNTER — Ambulatory Visit: Payer: Self-pay | Admitting: General Surgery

## 2017-07-11 ENCOUNTER — Inpatient Hospital Stay: Payer: BLUE CROSS/BLUE SHIELD

## 2017-07-11 ENCOUNTER — Inpatient Hospital Stay (HOSPITAL_BASED_OUTPATIENT_CLINIC_OR_DEPARTMENT_OTHER): Payer: BLUE CROSS/BLUE SHIELD | Admitting: Hematology and Oncology

## 2017-07-11 DIAGNOSIS — C50511 Malignant neoplasm of lower-outer quadrant of right female breast: Secondary | ICD-10-CM

## 2017-07-11 DIAGNOSIS — Z170 Estrogen receptor positive status [ER+]: Principal | ICD-10-CM

## 2017-07-11 DIAGNOSIS — N83209 Unspecified ovarian cyst, unspecified side: Secondary | ICD-10-CM | POA: Diagnosis not present

## 2017-07-11 DIAGNOSIS — Z9221 Personal history of antineoplastic chemotherapy: Secondary | ICD-10-CM | POA: Diagnosis not present

## 2017-07-11 DIAGNOSIS — Z79899 Other long term (current) drug therapy: Secondary | ICD-10-CM | POA: Diagnosis not present

## 2017-07-11 DIAGNOSIS — R5383 Other fatigue: Secondary | ICD-10-CM | POA: Diagnosis not present

## 2017-07-11 DIAGNOSIS — K769 Liver disease, unspecified: Secondary | ICD-10-CM | POA: Diagnosis not present

## 2017-07-11 DIAGNOSIS — Z9013 Acquired absence of bilateral breasts and nipples: Secondary | ICD-10-CM | POA: Diagnosis not present

## 2017-07-11 DIAGNOSIS — M899 Disorder of bone, unspecified: Secondary | ICD-10-CM

## 2017-07-11 DIAGNOSIS — R918 Other nonspecific abnormal finding of lung field: Secondary | ICD-10-CM

## 2017-07-11 LAB — COMPREHENSIVE METABOLIC PANEL
ALBUMIN: 3.9 g/dL (ref 3.5–5.0)
ALK PHOS: 63 U/L (ref 40–150)
ALT: 26 U/L (ref 0–55)
AST: 24 U/L (ref 5–34)
Anion gap: 8 (ref 3–11)
BILIRUBIN TOTAL: 0.3 mg/dL (ref 0.2–1.2)
BUN: 9 mg/dL (ref 7–26)
CALCIUM: 9.7 mg/dL (ref 8.4–10.4)
CO2: 27 mmol/L (ref 22–29)
Chloride: 104 mmol/L (ref 98–109)
Creatinine, Ser: 0.84 mg/dL (ref 0.60–1.10)
GFR calc Af Amer: >60 mL/min (ref >60)
GFR calc non Af Amer: >60 mL/min (ref >60)
GLUCOSE: 106 mg/dL (ref 70–140)
Potassium: 3.8 mmol/L (ref 3.5–5.1)
SODIUM: 139 mmol/L (ref 136–145)
TOTAL PROTEIN: 6.7 g/dL (ref 6.4–8.3)

## 2017-07-11 LAB — CBC WITH DIFFERENTIAL/PLATELET
BASOS PCT: 1 %
Basophils Absolute: 0 10*3/uL (ref 0.0–0.1)
EOS PCT: 2 %
Eosinophils Absolute: 0.1 10*3/uL (ref 0.0–0.5)
HEMATOCRIT: 34.5 % — AB (ref 34.8–46.6)
Hemoglobin: 11.7 g/dL (ref 11.6–15.9)
LYMPHS PCT: 20 %
Lymphs Abs: 0.6 10*3/uL — ABNORMAL LOW (ref 0.9–3.3)
MCH: 31.8 pg (ref 25.1–34.0)
MCHC: 33.9 g/dL (ref 31.5–36.0)
MCV: 93.9 fL (ref 79.5–101.0)
MONO ABS: 0.5 10*3/uL (ref 0.1–0.9)
MONOS PCT: 15 %
NEUTROS ABS: 1.9 10*3/uL (ref 1.5–6.5)
Neutrophils Relative %: 62 %
PLATELETS: 244 10*3/uL (ref 145–400)
RBC: 3.67 MIL/uL — ABNORMAL LOW (ref 3.70–5.45)
RDW: 12.6 % (ref 11.2–14.5)
WBC: 3.1 10*3/uL — ABNORMAL LOW (ref 3.9–10.3)

## 2017-07-11 MED ORDER — PALONOSETRON HCL INJECTION 0.25 MG/5ML
0.2500 mg | Freq: Once | INTRAVENOUS | Status: AC
  Administered 2017-07-11: 0.25 mg via INTRAVENOUS

## 2017-07-11 MED ORDER — SODIUM CHLORIDE 0.9 % IV SOLN
65.0000 mg/m2 | Freq: Once | INTRAVENOUS | Status: AC
  Administered 2017-07-11: 102 mg via INTRAVENOUS
  Filled 2017-07-11: qty 17

## 2017-07-11 MED ORDER — SODIUM CHLORIDE 0.9% FLUSH
10.0000 mL | INTRAVENOUS | Status: DC | PRN
  Administered 2017-07-11: 10 mL via INTRAVENOUS
  Filled 2017-07-11: qty 10

## 2017-07-11 MED ORDER — DEXAMETHASONE SODIUM PHOSPHATE 10 MG/ML IJ SOLN
INTRAMUSCULAR | Status: AC
  Filled 2017-07-11: qty 1

## 2017-07-11 MED ORDER — SODIUM CHLORIDE 0.9% FLUSH
10.0000 mL | INTRAVENOUS | Status: DC | PRN
  Administered 2017-07-11: 10 mL
  Filled 2017-07-11: qty 10

## 2017-07-11 MED ORDER — DIPHENHYDRAMINE HCL 50 MG/ML IJ SOLN
INTRAMUSCULAR | Status: AC
Start: 2017-07-11 — End: ?
  Filled 2017-07-11: qty 1

## 2017-07-11 MED ORDER — SODIUM CHLORIDE 0.9 % IV SOLN: Freq: Once | INTRAVENOUS | Status: DC

## 2017-07-11 MED ORDER — DEXAMETHASONE SODIUM PHOSPHATE 10 MG/ML IJ SOLN
10.0000 mg | Freq: Once | INTRAMUSCULAR | Status: AC
  Administered 2017-07-11: 10 mg via INTRAVENOUS

## 2017-07-11 MED ORDER — DIPHENHYDRAMINE HCL 50 MG/ML IJ SOLN
50.0000 mg | Freq: Once | INTRAMUSCULAR | Status: AC
  Administered 2017-07-11: 50 mg via INTRAVENOUS

## 2017-07-11 MED ORDER — SODIUM CHLORIDE 0.9 % IV SOLN
20.0000 mg | Freq: Once | INTRAVENOUS | Status: AC
  Administered 2017-07-11: 20 mg via INTRAVENOUS
  Filled 2017-07-11: qty 2

## 2017-07-11 MED ORDER — HEPARIN SOD (PORK) LOCK FLUSH 100 UNIT/ML IV SOLN
500.0000 [IU] | Freq: Once | INTRAVENOUS | Status: AC | PRN
  Administered 2017-07-11: 500 [IU]
  Filled 2017-07-11: qty 5

## 2017-07-11 MED ORDER — PALONOSETRON HCL INJECTION 0.25 MG/5ML
INTRAVENOUS | Status: AC
  Filled 2017-07-11: qty 5

## 2017-07-11 MED ORDER — FAMOTIDINE IN NACL 20-0.9 MG/50ML-% IV SOLN
20.0000 mg | Freq: Once | INTRAVENOUS | Status: DC
  Filled 2017-07-11: qty 50

## 2017-07-11 NOTE — Progress Notes (Signed)
Patient Care Team: Jettie Booze, NP as PCP - General (Family Medicine) Nicholas Lose, MD as Consulting Physician (Hematology and Oncology) Jovita Kussmaul, MD as Consulting Physician (General Surgery) Gery Pray, MD as Consulting Physician (Radiation Oncology)  DIAGNOSIS:  Encounter Diagnosis  Name Primary?  . 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, estrogen receptor positive ([place])     SUMMARY OF ONCOLOGIC HISTORY:   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, estrogen receptor positive ([place])   01/02/2017 Initial Diagnosis    Palpable right breast mass retroareolar 6:30 position: 3.6 cm size axilla negative, 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 HER-2 negative ratio 1.31 Ki-67 3% in addition calcifications UIQ 1.4 cm stereotactic biopsy flat epithelial atypia; clips are 4.3 cm apart, T2 N0 stage IB clinical stage AJCC 8      01/28/2017 Surgery    Bilateral mastectomies: Right: Grade 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%, PR 95%, HER-2 negative ratio 1.31, Ki-67 3%, 4/4 lymph nodes positive; left mastectomy: PASH and FC changes, no malignancy; T2 N2,  stage IIA AJCC 8      02/14/2017 Surgery    Right axillary lymph node dissection 8/11 lymph nodes positive      03/06/2017 -  Chemotherapy    Dose dense AC x4 followed by Taxol x12      CHIEF COMPLIANT: Cycle 10 Taxol  INTERVAL HISTORY: Angelica Floyd is a 52 year old with above-mentioned history of bilateral mastectomies for right-sided breast cancer who is currently in adjuvant chemotherapy and today's cycle 10 of Taxol.  She is tolerating it very well.  She denies any neuropathy.  Denies any nausea vomiting.  REVIEW OF SYSTEMS:   Constitutional: Denies fevers, chills or abnormal weight loss Eyes: Denies blurriness of vision Ears, nose, mouth, throat, and face: Denies mucositis or sore throat Respiratory: Denies cough, dyspnea or wheezes Cardiovascular: Denies  palpitation, chest discomfort Gastrointestinal:  Denies nausea, heartburn or change in bowel habits Skin: Denies abnormal skin rashes Lymphatics: Denies new lymphadenopathy or easy bruising Neurological:Denies numbness, tingling or new weaknesses Behavioral/Psych: Mood is stable, no new changes  Extremities: No lower extremity edema Breast:  denies any pain or lumps or nodules in either breasts All other systems were reviewed with the patient and are negative.  I have reviewed the past medical history, past surgical history, social history and family history with the patient and they are unchanged from previous note.  ALLERGIES:  has No Known Allergies.  MEDICATIONS:  Current Outpatient Medications  Medication Sig Dispense Refill  . Acetaminophen (TYLENOL CHILDRENS CHEWABLES PO) Take by mouth as needed.    . docusate sodium (COLACE) 50 MG capsule Take 1 capsule (50 mg total) by mouth 2 (two) times daily. 10 capsule 0  . ketotifen (ZADITOR) 0.025 % ophthalmic solution Apply 1 drop to eye daily as needed (allergies).    Marland Kitchen lidocaine-prilocaine (EMLA) cream Apply to affected area once 30 g 3  . LORazepam (ATIVAN) 0.5 MG tablet Take 1 tablet (0.5 mg total) by mouth at bedtime. 30 tablet 0  . magic mouthwash w/lidocaine SOLN Take 5 mLs by mouth 3 (three) times daily as needed for mouth pain. Swish 5-10ML 3 times a day as needed. Swish. Spit or Mandan. 240 mL 1  . Multiple Vitamins-Minerals (CENTRUM ADULTS) TABS Take 1 tablet by mouth daily.     . ondansetron (ZOFRAN) 8 MG tablet Take 1 tablet (8 mg total) by mouth 2 (two) times  daily as needed. Start on the third day after chemotherapy. (Patient not taking: Reported on 07/03/2017) 30 tablet 1  . prochlorperazine (COMPAZINE) 10 MG tablet Take 1 tablet (10 mg total) by mouth every 6 (six) hours as needed (Nausea or vomiting). (Patient not taking: Reported on 07/03/2017) 30 tablet 1   No current facility-administered medications for this visit.      PHYSICAL EXAMINATION: ECOG PERFORMANCE STATUS: 1 - Symptomatic but completely ambulatory  Vitals:   07/11/17 0901  BP: 132/89  Pulse: (!) 107  Resp: 18  Temp: 97.7 F (36.5 C)  SpO2: 100%   Filed Weights   07/11/17 0901  Weight: 121 lb 9.6 oz (55.2 kg)    GENERAL:alert, no distress and comfortable SKIN: skin color, texture, turgor are normal, no rashes or significant lesions EYES: normal, Conjunctiva are pink and non-injected, sclera clear OROPHARYNX:no exudate, no erythema and lips, buccal mucosa, and tongue normal  NECK: supple, thyroid normal size, non-tender, without nodularity LYMPH:  no palpable lymphadenopathy in the cervical, axillary or inguinal LUNGS: clear to auscultation and percussion with normal breathing effort HEART: regular rate & rhythm and no murmurs and no lower extremity edema ABDOMEN:abdomen soft, non-tender and normal bowel sounds MUSCULOSKELETAL:no cyanosis of digits and no clubbing  NEURO: alert & oriented x 3 with fluent speech, no focal motor/sensory deficits EXTREMITIES: No lower extremity edema  LABORATORY DATA:  I have reviewed the data as listed CMP Latest Ref Rng & Units 07/04/2017 06/27/2017 06/20/2017  Glucose 70 - 140 mg/dL 116 103 110  BUN 7 - 26 mg/dL 12 12 11  Creatinine 0.60 - 1.10 mg/dL 0.84 0.83 0.81  Sodium 136 - 145 mmol/L 139 139 141  Potassium 3.5 - 5.1 mmol/L 3.6 3.7 3.6  Chloride 98 - 109 mmol/L 104 104 105  CO2 22 - 29 mmol/L 27 27 27  Calcium 8.4 - 10.4 mg/dL 9.8 9.6 9.4  Total Protein 6.4 - 8.3 g/dL 6.8 6.9 6.7  Total Bilirubin 0.2 - 1.2 mg/dL 0.3 0.4 0.3  Alkaline Phos 40 - 150 U/L 64 66 64  AST 5 - 34 U/L 25 26 24  ALT 0 - 55 U/L 31 31 25    Lab Results  Component Value Date   WBC 3.1 (L) 07/11/2017   HGB 11.7 07/11/2017   HCT 34.5 (L) 07/11/2017   MCV 93.9 07/11/2017   PLT 244 07/11/2017   NEUTROABS 1.9 07/11/2017    ASSESSMENT & PLAN:  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 of  female, estrogen receptor positive ([facility]) 01/28/2017: Bilateral mastectomies: Right: 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 adjuvant chemotherapy with dose dense Adriamycin and Cytoxan x4 followed by Taxol weekly x12 3. Adjuvant radiation 4. Adjuvant antiestrogen therapy with tamoxifen (which was originally started prior to surgery) ---------------------------------------------------------------------- Current treatment: Completed 4 cycles of dose dense Adriamycin Cytoxan, today cycle10Taxol  Chemo toxicities: 1. Alopecia 2. Fatigue 3.Decrease neutrophils:we decreasedthe dosage of Taxol to 65 mg/m square with cycle2The previous neutropenia issue has resolved.  Return to clinicweekly for chemo and every 2 weeks for follow-up with   me, which will be her last cycle of chemotherapy. She wants to start radiation right after spring break.  She would like to see radiation oncology on the last and final day of her chemotherapy.  We will try to arrange for that.    I spent 25 minutes talking to the patient of which more than half was spent in counseling and coordination of care.  No orders of the defined types were placed in this encounter.  The patient has a good understanding of the overall plan. she agrees with it. she will call with any problems that may develop before the next visit here.   Viinay K Gudena, MD 07/11/17    

## 2017-07-11 NOTE — Patient Instructions (Signed)
[facility] Discharge Instructions for Patients Receiving Chemotherapy  Today you received the following chemotherapy agents:  Taxol.  To help prevent nausea and vomiting after your treatment, we encourage you to take your nausea medication as directed.   If you develop nausea and vomiting that is not controlled by your nausea medication, call the clinic.   BELOW ARE SYMPTOMS THAT SHOULD BE REPORTED IMMEDIATELY:  *FEVER GREATER THAN 100.5 F  *CHILLS WITH OR WITHOUT FEVER  NAUSEA AND VOMITING THAT IS NOT CONTROLLED WITH YOUR NAUSEA MEDICATION  *UNUSUAL SHORTNESS OF BREATH  *UNUSUAL BRUISING OR BLEEDING  TENDERNESS IN MOUTH AND THROAT WITH OR WITHOUT PRESENCE OF ULCERS  *URINARY PROBLEMS  *BOWEL PROBLEMS  UNUSUAL RASH Items with * indicate a potential emergency and should be followed up as soon as possible.  Feel free to call the clinic should you have any questions or concerns. The clinic phone number is [phone].  Please show the CHEMO ALERT CARD at check-in to the Emergency Department and triage nurse.   

## 2017-07-11 NOTE — Assessment & Plan Note (Signed)
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 adjuvant chemotherapy with dose dense Adriamycin and Cytoxan x4 followed by Taxol weekly x12 3. Adjuvant radiation 4. Adjuvant antiestrogen therapy with tamoxifen (which was originally started prior to surgery) ---------------------------------------------------------------------- Current treatment: Completed 4 cycles of dose dense Adriamycin Cytoxan, today cycle10Taxol  Chemo toxicities: 1. Alopecia 2. Fatigue 3.Decrease neutrophils:we decreasedthe dosage of Taxol to 65 mg/m square with cycle2The previous neutropenia issue has resolved.  Return to clinicweekly for chemo and every 2 weeks for follow-up with me

## 2017-07-16 ENCOUNTER — Ambulatory Visit: Payer: BLUE CROSS/BLUE SHIELD | Attending: Hematology and Oncology

## 2017-07-16 DIAGNOSIS — M25611 Stiffness of right shoulder, not elsewhere classified: Secondary | ICD-10-CM

## 2017-07-16 DIAGNOSIS — Z483 Aftercare following surgery for neoplasm: Secondary | ICD-10-CM | POA: Diagnosis present

## 2017-07-16 DIAGNOSIS — R29898 Other symptoms and signs involving the musculoskeletal system: Secondary | ICD-10-CM | POA: Diagnosis present

## 2017-07-16 DIAGNOSIS — R293 Abnormal posture: Secondary | ICD-10-CM | POA: Diagnosis present

## 2017-07-16 NOTE — Therapy (Signed)
[place], [place], [address] Phone: [phone]   Fax:  [phone]  Physical Therapy Treatment  Patient Details  Name: Angelica Floyd MRN: [id number] Date of Birth: [birth year] Referring Provider: Dr. Nicholas Lose   Encounter Date: 07/16/2017  PT End of Session - 07/16/17 0933    Visit Number  3    Number of Visits  13    Date for PT Re-Evaluation  09/02/17    PT Start Time  0851    PT Stop Time  0933    PT Time Calculation (min)  42 min    Activity Tolerance  Patient tolerated treatment well    Behavior During Therapy  [facility] for tasks assessed/performed       Past Medical History:  Diagnosis Date  . Anxiety    since breast cancer  . 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, estrogen receptor positive ([place]) 01/08/2017    Past Surgical History:  Procedure Laterality Date  . ABDOMINAL HYSTERECTOMY    . ADENOIDECTOMY W/ MYRINGOTOMY    . APPENDECTOMY    . AXILLARY LYMPH NODE DISSECTION Right 02/14/2017   Procedure: RIGHT AXILLARY LYMPH NODE DISSECTION ERAS PATHWAY;  Surgeon: Jovita Kussmaul, MD;  Location: [place];  Service: General;  Laterality: Right;  . CESAREAN SECTION    . MASTECTOMY W/ SENTINEL NODE BIOPSY Bilateral 01/28/2017   RIGHT BREAST BIOPSY  . MASTECTOMY W/ SENTINEL NODE BIOPSY Bilateral 01/28/2017   Procedure: BILATERAL MASTECTOMY WITH RIGHT SENTINEL LYMPH NODE BIOPSY;  Surgeon: Jovita Kussmaul, MD;  Location: [place];  Service: General;  Laterality: Bilateral;  . PORTACATH PLACEMENT Left 02/14/2017   Procedure: INSERTION PORT-A-CATH;  Surgeon: Jovita Kussmaul, MD;  Location: [place];  Service: General;  Laterality: Left;  . RE-EXCISION OF BREAST CANCER,SUPERIOR MARGINS N/A 02/14/2017   Procedure: RE-EXCISION INFERIOR FLAP;  Surgeon: Jovita Kussmaul, MD;  Location: [place];  Service: General;  Laterality: N/A;  . WISDOM TOOTH EXTRACTION      There were no vitals filed  for this visit.  Subjective Assessment - 07/16/17 0852    Subjective  I've noticed the tightness in my Rt axilla feels looser since last session.    Pertinent History  Bilateral mastectomy for right hormone receptive breast cancer with right SLNB 01/28/17 followed by re-excision and ALND 02/14/17. Has four more weeks of chemotherapy. Will start radiation in April. Otherwise healthy.    Patient Stated Goals  Get ahead of things before radiation and decrease cording    Currently in Pain?  No/denies         [facility] PT Assessment - 07/16/17 0001      AROM   Right Shoulder Flexion  149 Degrees    Right Shoulder ABduction  143 Degrees                  [facility] Adult PT Treatment/Exercise - 07/16/17 0001      Manual Therapy   Soft tissue mobilization  to cords at right axilla    Myofascial Release  crosshands across right axilla with focus on trying to release cording; also crosshands from right upper arm to left abdomen--those all done in supine               PT Short Term Goals - 07/03/17 1732      PT SHORT TERM GOAL #1   Title  Pt. will report perception of at least 30% decrease in right  axillary cording.    Time  4    Period  Weeks    Status  New        PT Long Term Goals - 07/03/17 1731      PT LONG TERM GOAL #1   Title  Pt. will report perception of at least 60% decrease in right axillary cording.    Time  8    Period  Weeks    Status  New      PT LONG TERM GOAL #2   Title  Pt. will be independent in HEP with stretches to minimize cording including right shoulder abduction.    Time  8    Period  Weeks    Status  New      PT LONG TERM GOAL #3   Title  right shoulder active flexion and abduction to at least 145 degrees each    Time  8    Period  [facility] Goals - 01/09/17 1430      Patient will be able to verbalize understanding of pertinent lymphedema risk reduction practices relevant to her diagnosis specifically  related to skin care.   Time  1    Period  Days    Status  Achieved      Patient will be able to return demonstrate and/or verbalize understanding of the post-op home exercise program related to regaining shoulder range of motion.   Time  1    Period  Days    Status  Achieved      Patient will be able to verbalize understanding of the importance of attending the postoperative After Breast Cancer Class for further lymphedema risk reduction education and therapeutic exercise.   Time  1    Period  Days    Status  Achieved       [facility] Clinic Goals - 05/27/17 1226      CC Long Term Goal  #1   Title  Patient will be independent in her home exercise program for improving shoulder ROM.    Baseline  Progressed HEP to include Strength ABC Program today-05/13/17; pt is now consistent with all HEP-05/27/17    Status  Achieved      CC Long Term Goal  #2   Title  Incease bilateral shoulder active flexion to >/= 130 degrees for increased ease reaching.    Baseline  Rt 110 degrees (cording very limiting) and Lt 133 degrees=04/02/17; Rt 138 and Lt 151 degrees-05/13/17; Rt 147 and Lt 155 degrees-05/27/17    Status  Achieved      CC Long Term Goal  #3   Title  Incease bilateral shoulder active abduction to >/= 130 degrees for increased ease reaching.    Baseline  Rt 103 degrees (cording very limiting) and Lt 134 degrees-04/02/17; Rt 140 and Lt 153 degrees-05/13/17; Rt 144 and Lt 166 degrees-05/27/17    Status  Achieved      CC Long Term Goal  #4   Title  Patient will report she has returned to running without increased edema in her right lateral trunk.    Baseline  Did run a half of the Thanksgiving [place] trot, but hasn't run since last chemo due to cold; Pt has gone running a few times up to 3 miles and reports this feeling good-05/13/17; Pt has been walk/running as she feels able now and reports no problems with this-05/27/17    Status  Achieved  CC Long Term Goal  #5   Title  Patient  will verbalize understanding of lymphedema risk reduction practices.    Status  Achieved         Plan - 07/16/17 0934    Clinical Impression Statement  Pts cording and tenderness was much improved in her Rt axilla today. Also her A/ROM has greatly improved since last measured at evaluation. She is about back to what she was at D/C over a month ago. Pt reports feeling pleased with how well she is responding to treatment. Also she has been continuing to increase her stretching and self myofascial release at home.     Rehab Potential  Excellent    Clinical Impairments Affecting Rehab Potential  currently undergoing chemo    PT Frequency  1x / week    PT Duration  4 weeks then possibly 2x/wk for 4 weeks    PT Treatment/Interventions  ADLs/Self Care Home Management;Moist Heat;Therapeutic exercise;Patient/family education;Manual techniques;Manual lymph drainage;Scar mobilization;Passive range of motion;Taping    PT Next Visit Plan  Manual work to reduce cording: soft tissue mobilization, myofascial release.    Consulted and Agree with Plan of Care  Patient       Patient will benefit from skilled therapeutic intervention in order to improve the following deficits and impairments:  Decreased range of motion, Pain, Increased fascial restricitons  Visit Diagnosis: Stiffness of right shoulder, not elsewhere classified  Aftercare following surgery for neoplasm  Other symptoms and signs involving the musculoskeletal system     Problem List Patient Active Problem List   Diagnosis Date Noted  . Cancer of central portion of right female breast ([place]) 01/28/2017  . 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, estrogen receptor positive ([place]) 01/08/2017    Otelia Limes, PTA 07/16/2017, 9:36 AM  [place], [place], [address] Phone: [phone]   Fax:  [phone]  Name: Angelica Floyd MRN: [id number] Date of Birth: February 23, 1966

## 2017-07-18 ENCOUNTER — Inpatient Hospital Stay: Payer: BLUE CROSS/BLUE SHIELD

## 2017-07-18 ENCOUNTER — Inpatient Hospital Stay: Payer: BLUE CROSS/BLUE SHIELD | Attending: Hematology and Oncology

## 2017-07-18 VITALS — BP 145/85 | HR 120 | Temp 98.3°F | Resp 18

## 2017-07-18 DIAGNOSIS — K769 Liver disease, unspecified: Secondary | ICD-10-CM | POA: Diagnosis not present

## 2017-07-18 DIAGNOSIS — C50511 Malignant neoplasm of lower-outer quadrant of right female breast: Secondary | ICD-10-CM | POA: Insufficient documentation

## 2017-07-18 DIAGNOSIS — Z170 Estrogen receptor positive status [ER+]: Principal | ICD-10-CM

## 2017-07-18 DIAGNOSIS — N83209 Unspecified ovarian cyst, unspecified side: Secondary | ICD-10-CM | POA: Insufficient documentation

## 2017-07-18 DIAGNOSIS — R918 Other nonspecific abnormal finding of lung field: Secondary | ICD-10-CM | POA: Diagnosis not present

## 2017-07-18 DIAGNOSIS — M899 Disorder of bone, unspecified: Secondary | ICD-10-CM | POA: Insufficient documentation

## 2017-07-18 DIAGNOSIS — Z5111 Encounter for antineoplastic chemotherapy: Secondary | ICD-10-CM | POA: Diagnosis not present

## 2017-07-18 DIAGNOSIS — Z923 Personal history of irradiation: Secondary | ICD-10-CM | POA: Insufficient documentation

## 2017-07-18 DIAGNOSIS — C773 Secondary and unspecified malignant neoplasm of axilla and upper limb lymph nodes: Secondary | ICD-10-CM | POA: Diagnosis not present

## 2017-07-18 DIAGNOSIS — Z79810 Long term (current) use of selective estrogen receptor modulators (SERMs): Secondary | ICD-10-CM | POA: Diagnosis not present

## 2017-07-18 DIAGNOSIS — Z9013 Acquired absence of bilateral breasts and nipples: Secondary | ICD-10-CM | POA: Diagnosis not present

## 2017-07-18 LAB — CBC WITH DIFFERENTIAL/PLATELET
BASOS ABS: 0 10*3/uL (ref 0.0–0.1)
Basophils Relative: 1 %
Eosinophils Absolute: 0.1 10*3/uL (ref 0.0–0.5)
Eosinophils Relative: 2 %
HEMATOCRIT: 35.6 % (ref 34.8–46.6)
Hemoglobin: 12 g/dL (ref 11.6–15.9)
LYMPHS PCT: 23 %
Lymphs Abs: 0.8 10*3/uL — ABNORMAL LOW (ref 0.9–3.3)
MCH: 31.9 pg (ref 25.1–34.0)
MCHC: 33.7 g/dL (ref 31.5–36.0)
MCV: 94.7 fL (ref 79.5–101.0)
MONO ABS: 0.5 10*3/uL (ref 0.1–0.9)
MONOS PCT: 15 %
NEUTROS ABS: 2.1 10*3/uL (ref 1.5–6.5)
NEUTROS PCT: 59 %
NRBC: 1 /100{WBCs} — AB
Platelets: 251 10*3/uL (ref 145–400)
RBC: 3.76 MIL/uL (ref 3.70–5.45)
RDW: 12.4 % (ref 11.2–14.5)
WBC: 3.5 10*3/uL — ABNORMAL LOW (ref 3.9–10.3)

## 2017-07-18 LAB — COMPREHENSIVE METABOLIC PANEL
ALBUMIN: 3.8 g/dL (ref 3.5–5.0)
ALT: 27 U/L (ref 0–55)
ANION GAP: 9 (ref 3–11)
AST: 23 U/L (ref 5–34)
Alkaline Phosphatase: 57 U/L (ref 40–150)
BILIRUBIN TOTAL: 0.4 mg/dL (ref 0.2–1.2)
BUN: 9 mg/dL (ref 7–26)
CALCIUM: 9.6 mg/dL (ref 8.4–10.4)
CO2: 26 mmol/L (ref 22–29)
Chloride: 104 mmol/L (ref 98–109)
Creatinine, Ser: 0.87 mg/dL (ref 0.60–1.10)
GFR calc Af Amer: >60 mL/min (ref >60)
GFR calc non Af Amer: >60 mL/min (ref >60)
GLUCOSE: 101 mg/dL (ref 70–140)
Potassium: 3.6 mmol/L (ref 3.5–5.1)
SODIUM: 139 mmol/L (ref 136–145)
TOTAL PROTEIN: 6.7 g/dL (ref 6.4–8.3)

## 2017-07-18 MED ORDER — DEXAMETHASONE SODIUM PHOSPHATE 10 MG/ML IJ SOLN
INTRAMUSCULAR | Status: AC
  Filled 2017-07-18: qty 1

## 2017-07-18 MED ORDER — DIPHENHYDRAMINE HCL 50 MG/ML IJ SOLN
50.0000 mg | Freq: Once | INTRAMUSCULAR | Status: AC
  Administered 2017-07-18: 50 mg via INTRAVENOUS

## 2017-07-18 MED ORDER — SODIUM CHLORIDE 0.9 % IV SOLN
20.0000 mg | Freq: Once | INTRAVENOUS | Status: AC
  Administered 2017-07-18: 20 mg via INTRAVENOUS
  Filled 2017-07-18: qty 2

## 2017-07-18 MED ORDER — DIPHENHYDRAMINE HCL 50 MG/ML IJ SOLN
INTRAMUSCULAR | Status: AC
  Filled 2017-07-18: qty 1

## 2017-07-18 MED ORDER — DEXAMETHASONE SODIUM PHOSPHATE 10 MG/ML IJ SOLN
10.0000 mg | Freq: Once | INTRAMUSCULAR | Status: AC
  Administered 2017-07-18: 10 mg via INTRAVENOUS

## 2017-07-18 MED ORDER — HEPARIN SOD (PORK) LOCK FLUSH 100 UNIT/ML IV SOLN
500.0000 [IU] | Freq: Once | INTRAVENOUS | Status: DC | PRN
  Filled 2017-07-18: qty 5

## 2017-07-18 MED ORDER — PALONOSETRON HCL INJECTION 0.25 MG/5ML
INTRAVENOUS | Status: AC
  Filled 2017-07-18: qty 5

## 2017-07-18 MED ORDER — PALONOSETRON HCL INJECTION 0.25 MG/5ML
0.2500 mg | Freq: Once | INTRAVENOUS | Status: AC
  Administered 2017-07-18: 0.25 mg via INTRAVENOUS

## 2017-07-18 MED ORDER — FAMOTIDINE IN NACL 20-0.9 MG/50ML-% IV SOLN
20.0000 mg | Freq: Once | INTRAVENOUS | Status: DC
Start: 2017-07-18 — End: 2017-07-18

## 2017-07-18 MED ORDER — PACLITAXEL CHEMO INJECTION 300 MG/50ML
65.0000 mg/m2 | Freq: Once | INTRAVENOUS | Status: AC
  Administered 2017-07-18: 102 mg via INTRAVENOUS
  Filled 2017-07-18: qty 17

## 2017-07-18 MED ORDER — SODIUM CHLORIDE 0.9% FLUSH
10.0000 mL | INTRAVENOUS | Status: DC | PRN
  Filled 2017-07-18: qty 10

## 2017-07-18 MED ORDER — SODIUM CHLORIDE 0.9 % IV SOLN
Freq: Once | INTRAVENOUS | Status: AC
  Administered 2017-07-18: 10:00:00 via INTRAVENOUS

## 2017-07-18 MED ORDER — SODIUM CHLORIDE 0.9% FLUSH
10.0000 mL | INTRAVENOUS | Status: DC | PRN
  Administered 2017-07-18: 10 mL via INTRAVENOUS
  Filled 2017-07-18: qty 10

## 2017-07-18 NOTE — Patient Instructions (Signed)
[facility] Discharge Instructions for Patients Receiving Chemotherapy  Today you received the following chemotherapy agents:  Taxol.  To help prevent nausea and vomiting after your treatment, we encourage you to take your nausea medication as directed.   If you develop nausea and vomiting that is not controlled by your nausea medication, call the clinic.   BELOW ARE SYMPTOMS THAT SHOULD BE REPORTED IMMEDIATELY:  *FEVER GREATER THAN 100.5 F  *CHILLS WITH OR WITHOUT FEVER  NAUSEA AND VOMITING THAT IS NOT CONTROLLED WITH YOUR NAUSEA MEDICATION  *UNUSUAL SHORTNESS OF BREATH  *UNUSUAL BRUISING OR BLEEDING  TENDERNESS IN MOUTH AND THROAT WITH OR WITHOUT PRESENCE OF ULCERS  *URINARY PROBLEMS  *BOWEL PROBLEMS  UNUSUAL RASH Items with * indicate a potential emergency and should be followed up as soon as possible.  Feel free to call the clinic should you have any questions or concerns. The clinic phone number is [phone].  Please show the CHEMO ALERT CARD at check-in to the Emergency Department and triage nurse.   

## 2017-07-22 ENCOUNTER — Ambulatory Visit: Payer: BLUE CROSS/BLUE SHIELD | Admitting: Physical Therapy

## 2017-07-22 DIAGNOSIS — M25611 Stiffness of right shoulder, not elsewhere classified: Secondary | ICD-10-CM | POA: Diagnosis not present

## 2017-07-22 DIAGNOSIS — R29898 Other symptoms and signs involving the musculoskeletal system: Secondary | ICD-10-CM

## 2017-07-22 DIAGNOSIS — R293 Abnormal posture: Secondary | ICD-10-CM

## 2017-07-22 DIAGNOSIS — Z483 Aftercare following surgery for neoplasm: Secondary | ICD-10-CM

## 2017-07-22 NOTE — Therapy (Signed)
[place], [place], [address] Phone: [phone]   Fax:  [phone]  Physical Therapy Treatment  Patient Details  Name: Angelica Floyd MRN: [id number] Date of Birth: [birth year] Referring Provider: Dr. Nicholas Lose   Encounter Date: 07/22/2017  PT End of Session - 07/22/17 2016    Visit Number  4    Number of Visits  13    Date for PT Re-Evaluation  09/02/17    PT Start Time  1435    PT Stop Time  1515    PT Time Calculation (min)  40 min    Activity Tolerance  Patient tolerated treatment well    Behavior During Therapy  [facility] for tasks assessed/performed       Past Medical History:  Diagnosis Date  . Anxiety    since breast cancer  . 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, estrogen receptor positive ([place]) 01/08/2017    Past Surgical History:  Procedure Laterality Date  . ABDOMINAL HYSTERECTOMY    . ADENOIDECTOMY W/ MYRINGOTOMY    . APPENDECTOMY    . AXILLARY LYMPH NODE DISSECTION Right 02/14/2017   Procedure: RIGHT AXILLARY LYMPH NODE DISSECTION ERAS PATHWAY;  Surgeon: Jovita Kussmaul, MD;  Location: [place];  Service: General;  Laterality: Right;  . CESAREAN SECTION    . MASTECTOMY W/ SENTINEL NODE BIOPSY Bilateral 01/28/2017   RIGHT BREAST BIOPSY  . MASTECTOMY W/ SENTINEL NODE BIOPSY Bilateral 01/28/2017   Procedure: BILATERAL MASTECTOMY WITH RIGHT SENTINEL LYMPH NODE BIOPSY;  Surgeon: Jovita Kussmaul, MD;  Location: [place];  Service: General;  Laterality: Bilateral;  . PORTACATH PLACEMENT Left 02/14/2017   Procedure: INSERTION PORT-A-CATH;  Surgeon: Jovita Kussmaul, MD;  Location: [place];  Service: General;  Laterality: Left;  . RE-EXCISION OF BREAST CANCER,SUPERIOR MARGINS N/A 02/14/2017   Procedure: RE-EXCISION INFERIOR FLAP;  Surgeon: Jovita Kussmaul, MD;  Location: [place];  Service: General;  Laterality: N/A;  . WISDOM TOOTH EXTRACTION      There were no vitals  filed for this visit.  Subjective Assessment - 07/22/17 1435    Subjective  I notice it here along my side. This is the last week of chemo.    Pertinent History  Bilateral mastectomy for right hormone receptive breast cancer with right SLNB 01/28/17 followed by re-excision and ALND 02/14/17. Has four more weeks of chemotherapy. Will start radiation in April. Otherwise healthy.    Currently in Pain?  No/denies                      [facility] Adult PT Treatment/Exercise - 07/22/17 0001      Self-Care   Other Self-Care Comments   Pt. asked about what she should do if cording continues once she starts radiation treatment, and whether she can expect it to resolve or not.  She was encouraged to continue stretching and to expect cording to resolve, whether that happens sooner of later, over time. Also suggested she do horizontal abduction stretches lying over towel roll to open up chest.      Manual Therapy   Soft tissue mobilization  to cords at right axilla    Myofascial Release  crosshands across right axilla with focus on trying to release cording; also crosshands from right upper arm to left abdomen--those all done in supine; right UE myofascial pulling with movement into abduction, then pulling with concurrent distraction in mediodistal direction at right chest. In  left sidelying, crosshands from right upper arm to right mid-flank.    Passive ROM  In Supine: Rt shoulder to pts tolerance into flexion, abduction, and er; also into horizontal abduction               PT Short Term Goals - 07/22/17 1437      PT SHORT TERM GOAL #1   Title  Pt. will report perception of at least 30% decrease in right axillary cording.    Baseline  Maybe 30% better as of 07/22/17    Status  Achieved        PT Long Term Goals - 07/03/17 1731      PT LONG TERM GOAL #1   Title  Pt. will report perception of at least 60% decrease in right axillary cording.    Time  8    Period  Weeks    Status   New      PT LONG TERM GOAL #2   Title  Pt. will be independent in HEP with stretches to minimize cording including right shoulder abduction.    Time  8    Period  Weeks    Status  New      PT LONG TERM GOAL #3   Title  right shoulder active flexion and abduction to at least 145 degrees each    Time  8    Period  [facility] Goals - 01/09/17 1430      Patient will be able to verbalize understanding of pertinent lymphedema risk reduction practices relevant to her diagnosis specifically related to skin care.   Time  1    Period  Days    Status  Achieved      Patient will be able to return demonstrate and/or verbalize understanding of the post-op home exercise program related to regaining shoulder range of motion.   Time  1    Period  Days    Status  Achieved      Patient will be able to verbalize understanding of the importance of attending the postoperative After Breast Cancer Class for further lymphedema risk reduction education and therapeutic exercise.   Time  1    Period  Days    Status  Achieved       [facility] Clinic Goals - 05/27/17 1226      CC Long Term Goal  #1   Title  Patient will be independent in her home exercise program for improving shoulder ROM.    Baseline  Progressed HEP to include Strength ABC Program today-05/13/17; pt is now consistent with all HEP-05/27/17    Status  Achieved      CC Long Term Goal  #2   Title  Incease bilateral shoulder active flexion to >/= 130 degrees for increased ease reaching.    Baseline  Rt 110 degrees (cording very limiting) and Lt 133 degrees=04/02/17; Rt 138 and Lt 151 degrees-05/13/17; Rt 147 and Lt 155 degrees-05/27/17    Status  Achieved      CC Long Term Goal  #3   Title  Incease bilateral shoulder active abduction to >/= 130 degrees for increased ease reaching.    Baseline  Rt 103 degrees (cording very limiting) and Lt 134 degrees-04/02/17; Rt 140 and Lt 153 degrees-05/13/17; Rt 144 and Lt  166 degrees-05/27/17    Status  Achieved      CC Long Term Goal  #4  Title  Patient will report she has returned to running without increased edema in her right lateral trunk.    Baseline  Did run a half of the Thanksgiving [place] trot, but hasn't run since last chemo due to cold; Pt has gone running a few times up to 3 miles and reports this feeling good-05/13/17; Pt has been walk/running as she feels able now and reports no problems with this-05/27/17    Status  Achieved      CC Long Term Goal  #5   Title  Patient will verbalize understanding of lymphedema risk reduction practices.    Status  Achieved         Plan - 07/22/17 2017    Clinical Impression Statement  Pt. feels looser after every therapy session.  She still has visible and palpable cording in right axilla, though it has improved.  She still has tenderness that limits tolerance for stretching. Despite that, she is improving.  She has met her short term goal for 30% decrease in perception of right axillary cording.    Rehab Potential  Excellent    Clinical Impairments Affecting Rehab Potential  will finish chemo 07/25/17    PT Frequency  1x / week    PT Duration  4 weeks then possibly 2x/week x 4 weeks    PT Treatment/Interventions  ADLs/Self Care Home Management;Moist Heat;Therapeutic exercise;Patient/family education;Manual techniques;Manual lymph drainage;Scar mobilization;Passive range of motion;Taping    PT Next Visit Plan  Manual work to reduce cording: soft tissue mobilization, myofascial release.    PT Home Exercise Plan  strength ABC program, right shoulder abduction stretch    Consulted and Agree with Plan of Care  Patient       Patient will benefit from skilled therapeutic intervention in order to improve the following deficits and impairments:  Decreased range of motion, Pain, Increased fascial restricitons  Visit Diagnosis: Stiffness of right shoulder, not elsewhere classified  Aftercare following surgery for  neoplasm  Other symptoms and signs involving the musculoskeletal system  Abnormal posture     Problem List Patient Active Problem List   Diagnosis Date Noted  . Cancer of central portion of right female breast ([place]) 01/28/2017  . 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

## 2017-07-25 ENCOUNTER — Inpatient Hospital Stay: Payer: BLUE CROSS/BLUE SHIELD

## 2017-07-25 ENCOUNTER — Other Ambulatory Visit: Payer: Self-pay | Admitting: *Deleted

## 2017-07-25 ENCOUNTER — Encounter: Payer: Self-pay | Admitting: *Deleted

## 2017-07-25 ENCOUNTER — Other Ambulatory Visit: Payer: Self-pay

## 2017-07-25 ENCOUNTER — Inpatient Hospital Stay (HOSPITAL_BASED_OUTPATIENT_CLINIC_OR_DEPARTMENT_OTHER): Payer: BLUE CROSS/BLUE SHIELD | Admitting: Hematology and Oncology

## 2017-07-25 ENCOUNTER — Encounter: Payer: Self-pay | Admitting: Radiation Oncology

## 2017-07-25 DIAGNOSIS — C50511 Malignant neoplasm of lower-outer quadrant of right female breast: Secondary | ICD-10-CM

## 2017-07-25 DIAGNOSIS — Z170 Estrogen receptor positive status [ER+]: Principal | ICD-10-CM

## 2017-07-25 DIAGNOSIS — Z923 Personal history of irradiation: Secondary | ICD-10-CM

## 2017-07-25 DIAGNOSIS — N83209 Unspecified ovarian cyst, unspecified side: Secondary | ICD-10-CM | POA: Diagnosis not present

## 2017-07-25 DIAGNOSIS — Z79810 Long term (current) use of selective estrogen receptor modulators (SERMs): Secondary | ICD-10-CM | POA: Diagnosis not present

## 2017-07-25 DIAGNOSIS — M899 Disorder of bone, unspecified: Secondary | ICD-10-CM

## 2017-07-25 DIAGNOSIS — C50111 Malignant neoplasm of central portion of right female breast: Secondary | ICD-10-CM

## 2017-07-25 DIAGNOSIS — R918 Other nonspecific abnormal finding of lung field: Secondary | ICD-10-CM | POA: Diagnosis not present

## 2017-07-25 DIAGNOSIS — K769 Liver disease, unspecified: Secondary | ICD-10-CM | POA: Diagnosis not present

## 2017-07-25 DIAGNOSIS — C773 Secondary and unspecified malignant neoplasm of axilla and upper limb lymph nodes: Secondary | ICD-10-CM

## 2017-07-25 DIAGNOSIS — Z9013 Acquired absence of bilateral breasts and nipples: Secondary | ICD-10-CM

## 2017-07-25 LAB — COMPREHENSIVE METABOLIC PANEL WITH GFR
ALT: 23 U/L (ref 0–55)
AST: 22 U/L (ref 5–34)
Albumin: 3.8 g/dL (ref 3.5–5.0)
Alkaline Phosphatase: 54 U/L (ref 40–150)
Anion gap: 8 (ref 3–11)
BUN: 12 mg/dL (ref 7–26)
CO2: 27 mmol/L (ref 22–29)
Calcium: 9.7 mg/dL (ref 8.4–10.4)
Chloride: 105 mmol/L (ref 98–109)
Creatinine, Ser: 0.83 mg/dL (ref 0.60–1.10)
GFR calc Af Amer: >60 mL/min
GFR calc non Af Amer: >60 mL/min
Glucose, Bld: 103 mg/dL (ref 70–140)
Potassium: 3.8 mmol/L (ref 3.5–5.1)
Sodium: 140 mmol/L (ref 136–145)
Total Bilirubin: 0.3 mg/dL (ref 0.2–1.2)
Total Protein: 6.7 g/dL (ref 6.4–8.3)

## 2017-07-25 LAB — CBC WITH DIFFERENTIAL/PLATELET
Basophils Absolute: 0 K/uL (ref 0.0–0.1)
Basophils Relative: 1 %
Eosinophils Absolute: 0.1 K/uL (ref 0.0–0.5)
Eosinophils Relative: 2 %
HCT: 35 % (ref 34.8–46.6)
Hemoglobin: 12 g/dL (ref 11.6–15.9)
Lymphocytes Relative: 22 %
Lymphs Abs: 0.7 K/uL — ABNORMAL LOW (ref 0.9–3.3)
MCH: 31.7 pg (ref 25.1–34.0)
MCHC: 34.2 g/dL (ref 31.5–36.0)
MCV: 92.6 fL (ref 79.5–101.0)
Monocytes Absolute: 0.4 K/uL (ref 0.1–0.9)
Monocytes Relative: 15 %
Neutro Abs: 1.8 K/uL (ref 1.5–6.5)
Neutrophils Relative %: 60 %
Platelets: 233 K/uL (ref 145–400)
RBC: 3.78 MIL/uL (ref 3.70–5.45)
RDW: 12.6 % (ref 11.2–14.5)
WBC: 3.1 K/uL — ABNORMAL LOW (ref 3.9–10.3)

## 2017-07-25 MED ORDER — SODIUM CHLORIDE 0.9 % IV SOLN
20.0000 mg | Freq: Once | INTRAVENOUS | Status: AC
  Administered 2017-07-25: 20 mg via INTRAVENOUS
  Filled 2017-07-25: qty 2

## 2017-07-25 MED ORDER — DIPHENHYDRAMINE HCL 50 MG/ML IJ SOLN
50.0000 mg | Freq: Once | INTRAMUSCULAR | Status: AC
  Administered 2017-07-25: 50 mg via INTRAVENOUS

## 2017-07-25 MED ORDER — SODIUM CHLORIDE 0.9% FLUSH
10.0000 mL | INTRAVENOUS | Status: DC | PRN
  Administered 2017-07-25: 10 mL via INTRAVENOUS
  Filled 2017-07-25: qty 10

## 2017-07-25 MED ORDER — DIPHENHYDRAMINE HCL 50 MG/ML IJ SOLN
INTRAMUSCULAR | Status: AC
  Filled 2017-07-25: qty 1

## 2017-07-25 MED ORDER — DEXAMETHASONE SODIUM PHOSPHATE 10 MG/ML IJ SOLN
INTRAMUSCULAR | Status: AC
  Filled 2017-07-25: qty 1

## 2017-07-25 MED ORDER — PALONOSETRON HCL INJECTION 0.25 MG/5ML
0.2500 mg | Freq: Once | INTRAVENOUS | Status: AC
  Administered 2017-07-25: 0.25 mg via INTRAVENOUS

## 2017-07-25 MED ORDER — SODIUM CHLORIDE 0.9 % IV SOLN
65.0000 mg/m2 | Freq: Once | INTRAVENOUS | Status: AC
  Administered 2017-07-25: 102 mg via INTRAVENOUS
  Filled 2017-07-25: qty 17

## 2017-07-25 MED ORDER — FAMOTIDINE IN NACL 20-0.9 MG/50ML-% IV SOLN: 20.0000 mg | Freq: Once | INTRAVENOUS | Status: DC

## 2017-07-25 MED ORDER — SODIUM CHLORIDE 0.9 % IV SOLN
Freq: Once | INTRAVENOUS | Status: AC
  Administered 2017-07-25: 10:00:00 via INTRAVENOUS

## 2017-07-25 MED ORDER — DEXAMETHASONE SODIUM PHOSPHATE 10 MG/ML IJ SOLN
10.0000 mg | Freq: Once | INTRAMUSCULAR | Status: AC
  Administered 2017-07-25: 10 mg via INTRAVENOUS

## 2017-07-25 MED ORDER — SODIUM CHLORIDE 0.9% FLUSH
10.0000 mL | INTRAVENOUS | Status: DC | PRN
  Administered 2017-07-25: 10 mL
  Filled 2017-07-25: qty 10

## 2017-07-25 MED ORDER — HEPARIN SOD (PORK) LOCK FLUSH 100 UNIT/ML IV SOLN
500.0000 [IU] | Freq: Once | INTRAVENOUS | Status: AC | PRN
  Administered 2017-07-25: 500 [IU]
  Filled 2017-07-25: qty 5

## 2017-07-25 MED ORDER — PALONOSETRON HCL INJECTION 0.25 MG/5ML
INTRAVENOUS | Status: AC
  Filled 2017-07-25: qty 5

## 2017-07-25 NOTE — Assessment & Plan Note (Signed)
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 adjuvant chemotherapy with dose dense Adriamycin and Cytoxan x4 followed by Taxol weekly x12 3. Adjuvant radiation 4. Adjuvant antiestrogen therapy with tamoxifen (which was originally started prior to surgery) ---------------------------------------------------------------------- Current treatment: Completed 4 cycles of dose dense Adriamycin Cytoxan, today cycle12Taxol  Chemo toxicities: 1. Alopecia 2. Fatigue 3.Decrease neutrophils:we decreasedthe dosage of Taxol to 65 mg/m square with cycle2The previous neutropenia issue has resolved.  Today is the last cycle of chemotherapy.  She hopes to start radiation after spring break. Return to clinic at the end of radiation. 

## 2017-07-25 NOTE — Progress Notes (Signed)
Floyd Care Team: Angelica Booze, NP as PCP - General (Family Medicine) Nicholas Lose, MD as Consulting Physician (Hematology and Oncology) Angelica Kussmaul, MD as Consulting Physician (General Surgery) Angelica Pray, MD as Consulting Physician (Radiation Oncology)  DIAGNOSIS:  Encounter Diagnosis  Name Primary?  . 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, estrogen receptor positive ([place])     SUMMARY OF ONCOLOGIC HISTORY:   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, estrogen receptor positive ([place])   01/02/2017 Initial Diagnosis    Palpable right breast mass retroareolar 6:30 position: 3.6 cm size axilla negative, 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 HER-2 negative ratio 1.31 Ki-67 3% Angelica addition calcifications UIQ 1.4 cm stereotactic biopsy flat epithelial atypia; clips are 4.3 cm apart, T2 N0 stage IB clinical stage AJCC 8      01/28/2017 Surgery    Bilateral mastectomies: Right: Grade 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%, PR 95%, HER-2 negative ratio 1.31, Ki-67 3%, 4/4 lymph nodes positive; left mastectomy: PASH and FC changes, no malignancy; T2 N2,  stage IIA AJCC 8      02/14/2017 Surgery    Right axillary lymph node dissection 8/11 lymph nodes positive      03/06/2017 -  Chemotherapy    Dose dense AC x4 followed by Taxol x12       CHIEF COMPLIANT: Cycle 12 Taxol  INTERVAL HISTORY: Angelica Floyd is a 52 year old with above-mentioned history of bilateral mastectomies who is currently Angelica adjuvant chemotherapy and today is her last cycle of chemotherapy with Taxol.  She is super excited about it.  She has tolerated the treatment reasonably well.  Does Angelica have neuropathy.  Denies any nausea vomiting.  REVIEW OF SYSTEMS:   Constitutional: Denies fevers, chills or abnormal weight loss Eyes: Denies blurriness of vision Ears, nose, mouth, throat, and face: Denies mucositis or sore throat Respiratory: Denies cough,  dyspnea or wheezes Cardiovascular: Denies palpitation, chest discomfort Gastrointestinal:  Denies nausea, heartburn or change Angelica bowel habits Skin: Denies abnormal skin rashes Lymphatics: Denies new lymphadenopathy or easy bruising Neurological:Denies numbness, tingling or new weaknesses Behavioral/Psych: Mood is stable, no new changes  Extremities: No lower extremity edema Well all other systems were reviewed with the Floyd and are negative.  I have reviewed the past medical history, past surgical history, social history and family history with the Floyd and they are unchanged from previous note.  ALLERGIES:  has No Known Allergies.  MEDICATIONS:  Current Outpatient Medications  Medication Sig Dispense Refill  . Acetaminophen (TYLENOL CHILDRENS CHEWABLES PO) Take by mouth as needed.    . docusate sodium (COLACE) 50 MG capsule Take 1 capsule (50 mg total) by mouth 2 (two) times daily. 10 capsule 0  . ketotifen (ZADITOR) 0.025 % ophthalmic solution Apply 1 drop to eye daily as needed (allergies).    Marland Kitchen lidocaine-prilocaine (EMLA) cream Apply to affected area once 30 g 3  . LORazepam (ATIVAN) 0.5 MG tablet Take 1 tablet (0.5 mg total) by mouth at bedtime. 30 tablet 0  . magic mouthwash w/lidocaine SOLN Take 5 mLs by mouth 3 (three) times daily as needed for mouth pain. Swish 5-10ML 3 times a day as needed. Swish. Spit or [place]. 240 mL 1  . Multiple Vitamins-Minerals (CENTRUM ADULTS) TABS Take 1 tablet by mouth daily.     . ondansetron (ZOFRAN) 8 MG tablet Take 1 tablet (8 mg total) by mouth 2 (two) times daily as  needed. Start on the third day after chemotherapy. (Floyd Angelica taking: Reported on 07/03/2017) 30 tablet 1  . prochlorperazine (COMPAZINE) 10 MG tablet Take 1 tablet (10 mg total) by mouth every 6 (six) hours as needed (Nausea or vomiting). (Floyd Angelica taking: Reported on 07/03/2017) 30 tablet 1   No current facility-administered medications for this visit.     PHYSICAL  EXAMINATION: ECOG PERFORMANCE STATUS: 1 - Symptomatic but completely ambulatory  Vitals:   07/25/17 0900  BP: (!) 149/84  Pulse: 99  Resp: 18  Temp: 98.4 F (36.9 C)  SpO2: 100%   Filed Weights   07/25/17 0900  Weight: 121 lb 1.6 oz (54.9 kg)    GENERAL:alert, no distress and comfortable SKIN: skin color, texture, turgor are normal, no rashes or significant lesions EYES: normal, Conjunctiva are pink and non-injected, sclera clear OROPHARYNX:no exudate, no erythema and lips, buccal mucosa, and tongue normal  NECK: supple, thyroid normal size, non-tender, without nodularity LYMPH:  no palpable lymphadenopathy Angelica the cervical, axillary or inguinal LUNGS: clear to auscultation and percussion with normal breathing effort HEART: regular rate & rhythm and no murmurs and no lower extremity edema ABDOMEN:abdomen soft, non-tender and normal bowel sounds MUSCULOSKELETAL:no cyanosis of digits and no clubbing  NEURO: alert & oriented x 3 with fluent speech, no focal motor/sensory deficits EXTREMITIES: No lower extremity edema BREAST: Floyd felt the left chest wall bumpiness.  When I examined these were ribs and there is no concern for recurrent cancer.  (exam performed Angelica the presence of a chaperone)  LABORATORY DATA:  I have reviewed the data as listed CMP Latest Ref Rng & Units 07/18/2017 07/11/2017 07/04/2017  Glucose 70 - 140 mg/dL 101 106 116  BUN 7 - 26 mg/dL '9 9 12  '$ Creatinine 0.60 - 1.10 mg/dL 0.87 0.84 0.84  Sodium 136 - 145 mmol/L 139 139 139  Potassium 3.5 - 5.1 mmol/L 3.6 3.8 3.6  Chloride 98 - 109 mmol/L 104 104 104  CO2 22 - 29 mmol/L '26 27 27  '$ Calcium 8.4 - 10.4 mg/dL 9.6 9.7 9.8  Total Protein 6.4 - 8.3 g/dL 6.7 6.7 6.8  Total Bilirubin 0.2 - 1.2 mg/dL 0.4 0.3 0.3  Alkaline Phos 40 - 150 U/L 57 63 64  AST 5 - 34 U/L '23 24 25  '$ ALT 0 - 55 U/L '27 26 31    '$ Lab Results  Component Value Date   WBC 3.5 (L) 07/18/2017   HGB 12.0 07/18/2017   HCT 35.6 07/18/2017   MCV  94.7 07/18/2017   PLT 251 07/18/2017   NEUTROABS 2.1 07/18/2017    ASSESSMENT & PLAN:  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, estrogen receptor positive ([place]) 01/28/2017: Bilateral mastectomies: Right: Grade 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%, PR 95%, HER-2 negative ratio 1.31, Ki-67 3%, 4/4 lymph nodes positive; left mastectomy: PASH and FC changes, no malignancy; T2 N2, stage IIA AJCC 8 02-14-17: 8/10 lymph nodes positive  CT chest 02/13/2017: 3 mm right middle lobe nodule likely benign benign cysts Angelica the liver, ovarian cysts few small sclerotic lesions Angelica the bone likely bone islands Bone scan 02/13/2017: No bone metastases  Treatment plan: 1. adjuvant chemotherapy with dose dense Adriamycin and Cytoxan x4 followed by Taxol weekly x12 3. Adjuvant radiation 4. Adjuvant antiestrogen therapy with tamoxifen (which was originally started prior to surgery) ---------------------------------------------------------------------- Current treatment: Completed 4 cycles of dose dense Adriamycin Cytoxan, today cycle12Taxol  Chemo toxicities: 1. Alopecia 2. Fatigue 3.Decrease  neutrophils:we decreasedthe dosage of Taxol to 65 mg/m square with cycle2The previous neutropenia issue has resolved.  Today is the last cycle of chemotherapy.  She hopes to start radiation after spring break. Return to clinic at the end of radiation.   I spent 25 minutes talking to the Floyd of which more than half was spent Angelica counseling and coordination of care.  Orders Placed This Encounter  Procedures  . Follicle stimulating hormone    Standing Status:   Future    Standing Expiration Date:   08/29/2018  . Estradiol, Ultra Sens   The Floyd has a good understanding of the overall plan. she agrees with it. she will call with any problems that may develop before the next visit here.   Angelica Ohara, MD 07/25/17

## 2017-07-25 NOTE — Patient Instructions (Signed)
[facility] Discharge Instructions for Patients Receiving Chemotherapy  Today you received the following chemotherapy agents:  Taxol.  To help prevent nausea and vomiting after your treatment, we encourage you to take your nausea medication as directed.   If you develop nausea and vomiting that is not controlled by your nausea medication, call the clinic.   BELOW ARE SYMPTOMS THAT SHOULD BE REPORTED IMMEDIATELY:  *FEVER GREATER THAN 100.5 F  *CHILLS WITH OR WITHOUT FEVER  NAUSEA AND VOMITING THAT IS NOT CONTROLLED WITH YOUR NAUSEA MEDICATION  *UNUSUAL SHORTNESS OF BREATH  *UNUSUAL BRUISING OR BLEEDING  TENDERNESS IN MOUTH AND THROAT WITH OR WITHOUT PRESENCE OF ULCERS  *URINARY PROBLEMS  *BOWEL PROBLEMS  UNUSUAL RASH Items with * indicate a potential emergency and should be followed up as soon as possible.  Feel free to call the clinic should you have any questions or concerns. The clinic phone number is [phone].  Please show the CHEMO ALERT CARD at check-in to the Emergency Department and triage nurse.   

## 2017-07-26 LAB — FOLLICLE STIMULATING HORMONE: FSH: 135 m[IU]/mL

## 2017-07-26 NOTE — Progress Notes (Signed)
Location of Breast Cancer: invasive mammary carcinoma of the right breast  Histology per Pathology Report:   02/14/17 Diagnosis 1. Lymph nodes, regional resection, Right Axillary - METASTATIC CARCINOMA IN EIGHT OF ELEVEN LYMPH NODES (8/11). 2. Breast, excision, Right Inferior Skin Margin of Inferior Flap - BREAST TISSUE WITH PREVIOUS BIOPSY SITE. - NO MALIGNANCY IDENTIFIED. - FINAL MARGIN CLEAR. 3. Breast, excision, Right Inferior Flap Deep Margin - BREAST TISSUE WITH PREVIOUS BIOPSY SITE. - NO MALIGNANCY IDENTIFIED. - FINAL MARGIN CLEAR  01/28/17 Diagnosis 1. Breast, simple mastectomy, Left - FIBROCYSTIC CHANGES WITH ADENOSIS, CALCIFICATIONS AND FOCAL USUAL DUCTAL HYPERPLASIA. - PSEUDOANGIOMATOUS STROMAL HYPERPLASIA (PASH). - FIBROADENOMA. - NO EVIDENCE OF MALIGNANCY. 2. Breast, simple mastectomy, Right - INVASIVE AND IN SITU LOBULAR CARCINOMA, 4.5 CM. - INVASIVE CARCINOMA FOCALLY INVOLVES ANTERIOR INFERIOR MARGIN. - PREVIOUS BIOPSY SITE. - FIBROCYSTIC CHANGES WITH FOCAL FLAT EPITHELIAL ATYPIA AND CALCIFICATION. 3. Lymph node, sentinel, biopsy, Right Axillary #1 - METASTATIC CARCINOMA IN ONE LYMPH NODE (1/1). 4. Lymph node, sentinel, biopsy, Right Axillary #2 - METASTATIC CARCINOMA IN ONE LYMPH NODE (1/1). 5. Lymph node, sentinel, biopsy, Right - METASTATIC CARCINOMA IN ONE LYMPH NODE (1/1). 6. Lymph node, sentinel, biopsy, Right Axillary #3 - METASTATIC CARCINOMA IN ONE LYMPH NODE (1/1).  01/02/17 Diagnosis 1. Breast, right, needle core biopsy, 6:30 o'clock - INVASIVE AND IN SITU MAMMARY CARCINOMA. - SEE COMMENT. 2. Breast, right, needle core biopsy, calcifications, upper, inner quadrant - FLAT EPITHELIAL ATYPIA (FEA) WITH CALCIFICATIONS.  Receptor Status: ER(95%), PR (95%), Her2-neu (negative), Ki-(3%)  Did patient present with symptoms (if so, please note symptoms) or was this found on screening mammography?: patient felt a lump herself.  Past/Anticipated  interventions by surgeon, if any: Procedure: 02/14/17 -Procedure: RIGHT AXILLARY LYMPH NODE DISSECTION ERAS PATHWAY;  Surgeon: Toth, Paul III, MD 02/14/17 - Procedure: RE-EXCISION INFERIOR FLAP;  Surgeon: Toth, Paul III, MD  01/28/17 - BILATERAL MASTECTOMY WITH RIGHT SENTINEL LYMPH NODE BIOPSY;  Surgeon: Toth, Paul III, MD  Past/Anticipated interventions by medical oncology, if any: adjuvant chemotherapy with dose dense Adriamycin and Cytoxan x4 followed by Taxol weekly x12.  She finished last Thursday.  Followed by adjuvant antiestrogen therapy with tamoxifen (which was originally started prior to surgery)   Lymphedema issues, if any: no   Pain issues, if any:  no   SAFETY ISSUES:  Prior radiation? no  Pacemaker/ICD? no  Possible current pregnancy?no  Is the patient on methotrexate? no  Current Complaints / other details:  Patient reports having cording on the right side and is currently getting physical therapy.  BP 135/84 (BP Location: Left Arm, Patient Position: Sitting)   Pulse 83   Temp 97.8 F (36.6 C) (Oral)   Ht 5' 3" (1.6 m)   Wt 121 lb (54.9 kg)   SpO2 100%   BMI 21.43 kg/m    Wt Readings from Last 3 Encounters:  08/01/17 121 lb (54.9 kg)  07/25/17 121 lb 1.6 oz (54.9 kg)  07/11/17 121 lb 9.6 oz (55.2 kg)      Hess, Karen R, RN 07/26/2017,8:43 AM   

## 2017-07-29 ENCOUNTER — Ambulatory Visit: Payer: BLUE CROSS/BLUE SHIELD | Admitting: Physical Therapy

## 2017-07-29 DIAGNOSIS — M25611 Stiffness of right shoulder, not elsewhere classified: Secondary | ICD-10-CM | POA: Diagnosis not present

## 2017-07-29 DIAGNOSIS — R29898 Other symptoms and signs involving the musculoskeletal system: Secondary | ICD-10-CM

## 2017-07-29 DIAGNOSIS — Z483 Aftercare following surgery for neoplasm: Secondary | ICD-10-CM

## 2017-07-29 NOTE — Therapy (Signed)
[place], [place], [address] Phone: [phone]   Fax:  [phone]  Physical Therapy Treatment  Patient Details  Name: Angelica Floyd MRN: [id number] Date of Birth: 1965-09-12 Referring Provider: Dr. Nicholas Lose   Encounter Date: 07/29/2017  PT End of Session - 07/29/17 1708    Visit Number  5    Number of Visits  13    Date for PT Re-Evaluation  09/02/17    PT Start Time  1346    PT Stop Time  1431    PT Time Calculation (min)  45 min    Activity Tolerance  Patient tolerated treatment well;Patient limited by pain    Behavior During Therapy  [facility] for tasks assessed/performed       Past Medical History:  Diagnosis Date  . Anxiety    since breast cancer  . 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, estrogen receptor positive ([place]) 01/08/2017    Past Surgical History:  Procedure Laterality Date  . ABDOMINAL HYSTERECTOMY    . ADENOIDECTOMY W/ MYRINGOTOMY    . APPENDECTOMY    . AXILLARY LYMPH NODE DISSECTION Right 02/14/2017   Procedure: RIGHT AXILLARY LYMPH NODE DISSECTION ERAS PATHWAY;  Surgeon: Jovita Kussmaul, MD;  Location: [place];  Service: General;  Laterality: Right;  . CESAREAN SECTION    . MASTECTOMY W/ SENTINEL NODE BIOPSY Bilateral 01/28/2017   RIGHT BREAST BIOPSY  . MASTECTOMY W/ SENTINEL NODE BIOPSY Bilateral 01/28/2017   Procedure: BILATERAL MASTECTOMY WITH RIGHT SENTINEL LYMPH NODE BIOPSY;  Surgeon: Jovita Kussmaul, MD;  Location: [place];  Service: General;  Laterality: Bilateral;  . PORTACATH PLACEMENT Left 02/14/2017   Procedure: INSERTION PORT-A-CATH;  Surgeon: Jovita Kussmaul, MD;  Location: [place];  Service: General;  Laterality: Left;  . RE-EXCISION OF BREAST CANCER,SUPERIOR MARGINS N/A 02/14/2017   Procedure: RE-EXCISION INFERIOR FLAP;  Surgeon: Jovita Kussmaul, MD;  Location: [place];  Service: General;  Laterality: N/A;  . WISDOM TOOTH EXTRACTION       There were no vitals filed for this visit.  Subjective Assessment - 07/29/17 1348    Subjective  Nothing new.  Stretching going pretty good.    Pertinent History  Bilateral mastectomy for right hormone receptive breast cancer with right SLNB 01/28/17 followed by re-excision and ALND 02/14/17. Has four more weeks of chemotherapy. Will start radiation in April. Otherwise healthy.    Currently in Pain?  No/denies         [facility] PT Assessment - 07/29/17 0001      AROM   Right Shoulder Flexion  137 Degrees prior to stretching; afterward, 139    Right Shoulder ABduction  133 Degrees prior to stretching; afterward, 165                  [facility] Adult PT Treatment/Exercise - 07/29/17 0001      Shoulder Exercises: Supine   Other Supine Exercises  supine over foam roller, relax shoulders back, then arms in horizontal abduction x 30 seconds, then active horizontal abduction x 10; then bilat. D2 actively x 10 with last one held x 30 seconds      Manual Therapy   Soft tissue mobilization  to cords at right axilla; also across right mastectomy incisionarea    Myofascial Release  crosshands across right axilla to try to release corrding, with shoulder in abduction and flexion; crosshands from right upper arm to abdomen; crosshands in vertical at  right chest; right UE myofascial pulling with movement into abduction. In left sidelying, crosshands across right axilla to patient tolerance.    Passive ROM  In Supine: Rt shoulder to pts tolerance into flexion, abduction, and er               PT Short Term Goals - 07/22/17 1437      PT SHORT TERM GOAL #1   Title  Pt. will report perception of at least 30% decrease in right axillary cording.    Baseline  Maybe 30% better as of 07/22/17    Status  Achieved        PT Long Term Goals - 07/03/17 1731      PT LONG TERM GOAL #1   Title  Pt. will report perception of at least 60% decrease in right axillary cording.    Time  8     Period  Weeks    Status  New      PT LONG TERM GOAL #2   Title  Pt. will be independent in HEP with stretches to minimize cording including right shoulder abduction.    Time  8    Period  Weeks    Status  New      PT LONG TERM GOAL #3   Title  right shoulder active flexion and abduction to at least 145 degrees each    Time  8    Period  [facility] Goals - 01/09/17 1430      Patient will be able to verbalize understanding of pertinent lymphedema risk reduction practices relevant to her diagnosis specifically related to skin care.   Time  1    Period  Days    Status  Achieved      Patient will be able to return demonstrate and/or verbalize understanding of the post-op home exercise program related to regaining shoulder range of motion.   Time  1    Period  Days    Status  Achieved      Patient will be able to verbalize understanding of the importance of attending the postoperative After Breast Cancer Class for further lymphedema risk reduction education and therapeutic exercise.   Time  1    Period  Days    Status  Achieved       [facility] Clinic Goals - 05/27/17 1226      CC Long Term Goal  #1   Title  Patient will be independent in her home exercise program for improving shoulder ROM.    Baseline  Progressed HEP to include Strength ABC Program today-05/13/17; pt is now consistent with all HEP-05/27/17    Status  Achieved      CC Long Term Goal  #2   Title  Incease bilateral shoulder active flexion to >/= 130 degrees for increased ease reaching.    Baseline  Rt 110 degrees (cording very limiting) and Lt 133 degrees=04/02/17; Rt 138 and Lt 151 degrees-05/13/17; Rt 147 and Lt 155 degrees-05/27/17    Status  Achieved      CC Long Term Goal  #3   Title  Incease bilateral shoulder active abduction to >/= 130 degrees for increased ease reaching.    Baseline  Rt 103 degrees (cording very limiting) and Lt 134 degrees-04/02/17; Rt 140 and Lt 153  degrees-05/13/17; Rt 144 and Lt 166 degrees-05/27/17    Status  Achieved      CC Long  Term Goal  #4   Title  Patient will report she has returned to running without increased edema in her right lateral trunk.    Baseline  Did run a half of the Thanksgiving [place] trot, but hasn't run since last chemo due to cold; Pt has gone running a few times up to 3 miles and reports this feeling good-05/13/17; Pt has been walk/running as she feels able now and reports no problems with this-05/27/17    Status  Achieved      CC Long Term Goal  #5   Title  Patient will verbalize understanding of lymphedema risk reduction practices.    Status  Achieved         Plan - 07/29/17 1708    Clinical Impression Statement  Pt. does very well tolerating fairly vigorous stretching and manual techniques, but is limited by pain, particularly with direct pressure on cording. Cording remains visible and palpable, with no release felt today.  AROM of right shoulder was measured before and after stretching; just 2 degrees of improvement in flexion today, but 32 degrees of improvement in abduction during today's session. The continued cording is frustrating for both patient and therapist, but she clearly responds to treatment, as evidenced by that improvement.    Rehab Potential  Excellent    Clinical Impairments Affecting Rehab Potential  finished chemo 07/25/17    PT Frequency  2x / week    PT Duration  4 weeks    PT Treatment/Interventions  ADLs/Self Care Home Management;Moist Heat;Therapeutic exercise;Patient/family education;Manual techniques;Manual lymph drainage;Scar mobilization;Passive range of motion;Taping    PT Next Visit Plan  Manual work to reduce cording: soft tissue mobilization, myofascial release.    PT Home Exercise Plan  strength ABC program, right shoulder abduction stretch    Consulted and Agree with Plan of Care  Patient       Patient will benefit from skilled therapeutic intervention in order to  improve the following deficits and impairments:  Decreased range of motion, Pain, Increased fascial restricitons  Visit Diagnosis: Stiffness of right shoulder, not elsewhere classified  Aftercare following surgery for neoplasm  Other symptoms and signs involving the musculoskeletal system     Problem List Patient Active Problem List   Diagnosis Date Noted  . Cancer of central portion of right female breast ([place]) 01/28/2017  . 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, estrogen receptor positive ([place]) 01/08/2017    SALISBURY,DONNA 07/29/2017, 5:12 PM  [place], [place], [address] Phone: [phone]   Fax:  [phone]  Name: Angelica Floyd MRN: [id number] Date of Birth: Jan 31, 1966  Serafina Royals, PT 07/29/17 5:12 PM

## 2017-07-30 LAB — ESTRADIOL, ULTRA SENS: Estradiol, Sensitive: <2.5 pg/mL

## 2017-07-31 ENCOUNTER — Encounter: Payer: BLUE CROSS/BLUE SHIELD | Admitting: Physical Therapy

## 2017-08-01 ENCOUNTER — Ambulatory Visit: Payer: BLUE CROSS/BLUE SHIELD

## 2017-08-01 ENCOUNTER — Encounter: Payer: Self-pay | Admitting: Radiation Oncology

## 2017-08-01 ENCOUNTER — Ambulatory Visit
Admission: RE | Admit: 2017-08-01 | Discharge: 2017-08-01 | Disposition: A | Discharge status: Home / Self Care | Payer: BLUE CROSS/BLUE SHIELD | Source: Ambulatory Visit | Attending: Radiation Oncology | Admitting: Radiation Oncology

## 2017-08-01 ENCOUNTER — Other Ambulatory Visit: Payer: Self-pay

## 2017-08-01 DIAGNOSIS — C50511 Malignant neoplasm of lower-outer quadrant of right female breast: Secondary | ICD-10-CM

## 2017-08-01 DIAGNOSIS — F419 Anxiety disorder, unspecified: Secondary | ICD-10-CM | POA: Diagnosis not present

## 2017-08-01 DIAGNOSIS — C50911 Malignant neoplasm of unspecified site of right female breast: Secondary | ICD-10-CM | POA: Insufficient documentation

## 2017-08-01 DIAGNOSIS — Z9013 Acquired absence of bilateral breasts and nipples: Secondary | ICD-10-CM | POA: Insufficient documentation

## 2017-08-01 DIAGNOSIS — Z170 Estrogen receptor positive status [ER+]: Secondary | ICD-10-CM | POA: Insufficient documentation

## 2017-08-01 DIAGNOSIS — M25611 Stiffness of right shoulder, not elsewhere classified: Secondary | ICD-10-CM | POA: Diagnosis not present

## 2017-08-01 DIAGNOSIS — Z483 Aftercare following surgery for neoplasm: Secondary | ICD-10-CM

## 2017-08-01 DIAGNOSIS — Z79899 Other long term (current) drug therapy: Secondary | ICD-10-CM | POA: Insufficient documentation

## 2017-08-01 DIAGNOSIS — R29898 Other symptoms and signs involving the musculoskeletal system: Secondary | ICD-10-CM

## 2017-08-01 NOTE — Progress Notes (Signed)
Radiation Oncology         [phone] ________________________________  Follow Up New Outpatient Consultation  Name: Angelica Floyd MRN: [id number]  Date: 08/01/2017  DOB: [birth year]  CC:Angelica Booze, NP  Nicholas Lose, MD   REFERRING PHYSICIAN: Nicholas Lose, MD  DIAGNOSIS: 52 year-old woman with invasive mammary carcinoma of the right breast, Grade 1, ER+/ PR+/ HER-2(-)   Classification:ClinicalForm:Breast, AJCC 8th Edition Stage IIIA (pT2, pN3a, cM0, G1, ER+, PR+, HER2-)   HISTORY OF PRESENT ILLNESS::Angelica Floyd is a 52 y.o. female who was seen initially in our multidisciplinary breast clinic in 12/2016. The patient had routine screening mammogram at [facility] on 12/03/2016 and no mammographic evidence of malignancy was seen. However, the patient returned for diagnostic mammogram and right breast ultrasound at [facility] on 12/24/2016 complaining of palpable mass in the right lower breast detected clinically. These scans showed a suspicious ill-marginated irregular heterogenous hypoechoic mass in the 6:30 o'clock position 3 cm from the nipple of the right breast, measuring 3.6 x 1.6 x 2.2 cm. Also seen were indeterminate calcifications in the upper inner quadrant of the right breast and no evidence of right axillary lymphadenopathy.   Biopsies of the right breast were performed on 01/02/2017. Biopsy of the right breast at the 6:30 o'clock position revealed invasive and in situ mammary carcinoma, Grade 2, ER 95% / PR 95% / HER-2 negative / Ki-67 3%. The malignant cells are negative for E-Cadherin, supporting a lobular phenotype. Biopsy of the upper inner quadrant right breast calcifications showed flat epithelial atypia (FEA) with calcifications.  Interval History:  She underwent bilateral mastectomies with sentinel node mapping on 01/28/17. 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 4/4 nodes were positive for metastatic carcinoma.  Left mastectomy with PASH and FC changes only; no malignancy. She underwent right axillary lymph node dissection on 02/14/17 with 8/11 nodes positive for malignancy.  She was started on adjuvant chemotherapy on 03/06/17 with ddAC x 4 followed by weekly Taxol x 12 which she tolerated relatively well and completed on 07/25/17.  She comes in today to discuss her surgical findings and further discuss the role for starting post-mastectomy adjuvant radiotherapy.  PREVIOUS RADIATION THERAPY: No  PAST MEDICAL HISTORY:  has a past medical history of Anxiety and 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, estrogen receptor positive ([place]) (01/08/2017).    PAST SURGICAL HISTORY: Past Surgical History:  Procedure Laterality Date  . ABDOMINAL HYSTERECTOMY    . ADENOIDECTOMY W/ MYRINGOTOMY    . APPENDECTOMY    . AXILLARY LYMPH NODE DISSECTION Right 02/14/2017   Procedure: RIGHT AXILLARY LYMPH NODE DISSECTION ERAS PATHWAY;  Surgeon: Jovita Kussmaul, MD;  Location: [place];  Service: General;  Laterality: Right;  . CESAREAN SECTION    . MASTECTOMY W/ SENTINEL NODE BIOPSY Bilateral 01/28/2017   RIGHT BREAST BIOPSY  . MASTECTOMY W/ SENTINEL NODE BIOPSY Bilateral 01/28/2017   Procedure: BILATERAL MASTECTOMY WITH RIGHT SENTINEL LYMPH NODE BIOPSY;  Surgeon: Jovita Kussmaul, MD;  Location: [place];  Service: General;  Laterality: Bilateral;  . PORTACATH PLACEMENT Left 02/14/2017   Procedure: INSERTION PORT-A-CATH;  Surgeon: Jovita Kussmaul, MD;  Location: [place];  Service: General;  Laterality: Left;  . RE-EXCISION OF BREAST CANCER,SUPERIOR MARGINS N/A 02/14/2017   Procedure: RE-EXCISION INFERIOR FLAP;  Surgeon: Jovita Kussmaul, MD;  Location: [place];  Service: General;  Laterality: N/A;  . WISDOM TOOTH EXTRACTION      FAMILY HISTORY: family history is not on file.  SOCIAL HISTORY:  reports that she has never smoked. She has  never used smokeless tobacco. She reports that she drinks about 3.0 oz of alcohol per week. She reports that she does not use drugs.  She lives [place], [place]. She enjoys running and is gradually easing herself back in to a daily running routine.  ALLERGIES: Patient has no known allergies.  MEDICATIONS:  Current Outpatient Medications  Medication Sig Dispense Refill  . ketotifen (ZADITOR) 0.025 % ophthalmic solution Apply 1 drop to eye daily as needed (allergies).    . LORazepam (ATIVAN) 0.5 MG tablet Take 1 tablet (0.5 mg total) by mouth at bedtime. 30 tablet 0  . Multiple Vitamins-Minerals (CENTRUM ADULTS) TABS Take 1 tablet by mouth daily.     . Acetaminophen (TYLENOL CHILDRENS CHEWABLES PO) Take by mouth as needed.    . docusate sodium (COLACE) 50 MG capsule Take 1 capsule (50 mg total) by mouth 2 (two) times daily. (Patient not taking: Reported on 08/01/2017) 10 capsule 0  . lidocaine-prilocaine (EMLA) cream Apply to affected area once (Patient not taking: Reported on 08/01/2017) 30 g 3  . magic mouthwash w/lidocaine SOLN Take 5 mLs by mouth 3 (three) times daily as needed for mouth pain. Swish 5-10ML 3 times a day as needed. Swish. Spit or [facility]. (Patient not taking: Reported on 08/01/2017) 240 mL 1   No current facility-administered medications for this encounter.    Gynecologic History  Age at first menstrual period? N/A  Are you still having periods? No Approximate date of last period? October 2015  If you are still having periods: Are your periods regular? N/A  If you no longer have periods: Have you used hormone replacement? No  If YES, for how long? N/A When did you stop? N/A Obstetric History:  How many children have you carried to term? 2 Your age at first live birth? 11  Pregnant now or trying to get pregnant? No  Have you used birth control pills or hormone shots for contraception? Yes  If so, for how long (or approximate dates)? 2778-2423  Would you be interested  in learning more about the options to preserve fertility? No Health Maintenance:  Have you ever had a colonoscopy? No If yes, date? N/A  Have you ever had a bone density? No If yes, date? N/A  Date of your last PAP smear? October 2017 Date of your FIRST mammogram? 2012   REVIEW OF SYSTEMS:  On review of systems, the patient reports that she is doing well overall. She is glad to be finished with chemotherapy. She uses glasses and contacts. She reports chest wall tightness s/p mastectomy and is working with physical therapy.  She denies chest pain, shortness of breath, cough, fever or chills.  She has not had significant swelling in the right upper extremity.  Her incisions have healed well without complication and she has not had recent drainage or bleeding for the surgical sites.  She continues with anxiety but this has improved. A complete review of systems is obtained and is otherwise negative.    General: Alert and oriented, in no acute distress HEENT: Head is normocephalic. Extraocular movements are intact.  Neck: Neck is supple, no palpable cervical or supraclavicular lymphadenopathy. Heart: Regular in rate and rhythm with no murmurs, rubs, or gallops. Chest: Clear to auscultation bilaterally, with no rhonchi, wheezes, or rales.  Abdomen: Soft, nontender, nondistended, with no rigidity or guarding. Extremities: No cyanosis or edema. Lymphatics: see Neck Exam Skin: No concerning lesions. Musculoskeletal: symmetric strength and muscle tone throughout. Neurologic: Cranial nerves II through XII are grossly intact. No obvious focalities. Speech is fluent. Coordination is intact. Psychiatric: Judgment and insight are intact. Affect is appropriate. Breast: S/P bilateral mastectomies with incisions well healed and without signs of infection.  There is no drainage or bleeding from the surgical incisions. A Port-a-cath is in place in the left chest wall and appears C/D/I.  She is non-tender to  palpation bilaterally.  There is some mild cording in the right axilla.   ECOG = 1  0 - Asymptomatic (Fully active, able to carry on all predisease activities without restriction)  1 - Symptomatic but completely ambulatory (Restricted in physically strenuous activity but ambulatory and able to carry out work of a light or sedentary nature. For example, light housework, office work)  2 - Symptomatic, <50% in bed during the day (Ambulatory and capable of all self care but unable to carry out any work activities. Up and about more than 50% of waking hours)  3 - Symptomatic, >50% in bed, but not bedbound (Capable of only limited self-care, confined to bed or chair 50% or more of waking hours)  4 - Bedbound (Completely disabled. Cannot carry on any self-care. Totally confined to bed or chair)  5 - Death   Eustace Pen MM, Creech RH, Tormey DC, et al. [phone]). "Toxicity and response criteria of the [facility]". [place] Oncol. 5 (6): 649-55  LABORATORY DATA:  Lab Results  Component Value Date   WBC 3.1 (L) 07/25/2017   HGB 12.0 07/25/2017   HCT 35.0 07/25/2017   MCV 92.6 07/25/2017   PLT 233 07/25/2017   NEUTROABS 1.8 07/25/2017   Lab Results  Component Value Date   NA 140 07/25/2017   K 3.8 07/25/2017   CL 105 07/25/2017   CO2 27 07/25/2017   GLUCOSE 103 07/25/2017   CREATININE 0.83 07/25/2017   CALCIUM 9.7 07/25/2017      RADIOGRAPHY: No results found.    IMPRESSION: 52 year-old woman with invasive lobular carcinoma of the right breast,Stage IIIA (pT2, pN3a, cM0, G1, ER+, PR+, HER2-)  with a focally positive surgical margin s/p mastectomy and multiple positive lymph nodes.   She has recently completed adjuvant chemotherapy which she tolerated well.  PLAN: Today, we talked to the patient about the findings and work-up thus far. We discussed the patient's final pathology as it pertains to her diagnosis of right breast cancer and general treatment for this,  highlighting the role of adjuvant radiotherapy in the management of post mastectomy patients. We discussed the available radiation techniques, and focused on the details of logistics and delivery. The recommendation is for a 6 1/2 week course of daily treatments to the right chest wall and axilla.  We discussed the risks, benefits, and side effects of radiotherapy.  At the conclusion of our conversation, the patient elects to proceed with adjuvant radiotherapy and would like to schedule CT Simulation for the week of April 1st,  anticipation of beginning treatments around 08/19/17, once they return from a planned mountain trip over Spring Break.     Nicholos Johns, PA-C  gg  &  Blair Promise, PhD, MD

## 2017-08-01 NOTE — Therapy (Signed)
[place], [place], [address] Phone: [phone]   Fax:  [phone]  Physical Therapy Treatment  Patient Details  Name: Angelica Floyd MRN: [id number] Date of Birth: March 03, 1966 Referring Provider: Dr. Nicholas Lose   Encounter Date: 08/01/2017  PT End of Session - 08/01/17 1151    Visit Number  6    Number of Visits  13    Date for PT Re-Evaluation  09/02/17    PT Start Time  1108    PT Stop Time  1150    PT Time Calculation (min)  42 min    Activity Tolerance  Patient tolerated treatment well    Behavior During Therapy  [facility] for tasks assessed/performed       Past Medical History:  Diagnosis Date  . Anxiety    since breast cancer  . 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, estrogen receptor positive ([place]) 01/08/2017    Past Surgical History:  Procedure Laterality Date  . ABDOMINAL HYSTERECTOMY    . ADENOIDECTOMY W/ MYRINGOTOMY    . APPENDECTOMY    . AXILLARY LYMPH NODE DISSECTION Right 02/14/2017   Procedure: RIGHT AXILLARY LYMPH NODE DISSECTION ERAS PATHWAY;  Surgeon: Jovita Kussmaul, MD;  Location: [place];  Service: General;  Laterality: Right;  . CESAREAN SECTION    . MASTECTOMY W/ SENTINEL NODE BIOPSY Bilateral 01/28/2017   RIGHT BREAST BIOPSY  . MASTECTOMY W/ SENTINEL NODE BIOPSY Bilateral 01/28/2017   Procedure: BILATERAL MASTECTOMY WITH RIGHT SENTINEL LYMPH NODE BIOPSY;  Surgeon: Jovita Kussmaul, MD;  Location: [place];  Service: General;  Laterality: Bilateral;  . PORTACATH PLACEMENT Left 02/14/2017   Procedure: INSERTION PORT-A-CATH;  Surgeon: Jovita Kussmaul, MD;  Location: [place];  Service: General;  Laterality: Left;  . RE-EXCISION OF BREAST CANCER,SUPERIOR MARGINS N/A 02/14/2017   Procedure: RE-EXCISION INFERIOR FLAP;  Surgeon: Jovita Kussmaul, MD;  Location: [place];  Service: General;  Laterality: N/A;  . WISDOM TOOTH EXTRACTION      There were no vitals  filed for this visit.  Subjective Assessment - 08/01/17 1109    Subjective  Cording is still there, it's just so frustrating. When I come here you guys loosen it up and I try to keep stretching to keep it loose but it always tightnes back down again. I met with Dr. Sondra Come today and he said I start radiation probably the second week of April.     Pertinent History  Bilateral mastectomy for right hormone receptive breast cancer with right SLNB 01/28/17 followed by re-excision and ALND 02/14/17. Just finished chemo week of 07/22/17. Will start radiation in April. Otherwise healthy.    Patient Stated Goals  Get ahead of things before radiation and decrease cording    Currently in Pain?  No/denies                      [facility] Adult PT Treatment/Exercise - 08/01/17 0001      Manual Therapy   Soft tissue mobilization  to cords at right axilla; also across right mastectomy incision area    Myofascial Release  crosshands across right axilla to try to release cording, with shoulder in abduction and flexion; crosshands from right upper arm to abdomen; crosshands in vertical at right chest; right UE myofascial pulling with movement into abduction. In left sidelying, crosshands across right axilla and at lateral aspect of incision and directly inferior to where tissue is the  tightest.    Passive ROM  In Supine: Rt shoulder to pts tolerance into flexion, abduction; then in Lt S/L horizontal abduction while stabilizing chest wall               PT Short Term Goals - 07/22/17 1437      PT SHORT TERM GOAL #1   Title  Pt. will report perception of at least 30% decrease in right axillary cording.    Baseline  Maybe 30% better as of 07/22/17    Status  Achieved        PT Long Term Goals - 07/03/17 1731      PT LONG TERM GOAL #1   Title  Pt. will report perception of at least 60% decrease in right axillary cording.    Time  8    Period  Weeks    Status  New      PT LONG TERM GOAL #2    Title  Pt. will be independent in HEP with stretches to minimize cording including right shoulder abduction.    Time  8    Period  Weeks    Status  New      PT LONG TERM GOAL #3   Title  right shoulder active flexion and abduction to at least 145 degrees each    Time  8    Period  [facility] Goals - 01/09/17 1430      Patient will be able to verbalize understanding of pertinent lymphedema risk reduction practices relevant to her diagnosis specifically related to skin care.   Time  1    Period  Days    Status  Achieved      Patient will be able to return demonstrate and/or verbalize understanding of the post-op home exercise program related to regaining shoulder range of motion.   Time  1    Period  Days    Status  Achieved      Patient will be able to verbalize understanding of the importance of attending the postoperative After Breast Cancer Class for further lymphedema risk reduction education and therapeutic exercise.   Time  1    Period  Days    Status  Achieved       [facility] Goals - 05/27/17 1226      CC Long Term Goal  #1   Title  Patient will be independent in her home exercise program for improving shoulder ROM.    Baseline  Progressed HEP to include Strength ABC Program today-05/13/17; pt is now consistent with all HEP-05/27/17    Status  Achieved      CC Long Term Goal  #2   Title  Incease bilateral shoulder active flexion to >/= 130 degrees for increased ease reaching.    Baseline  Rt 110 degrees (cording very limiting) and Lt 133 degrees=04/02/17; Rt 138 and Lt 151 degrees-05/13/17; Rt 147 and Lt 155 degrees-05/27/17    Status  Achieved      CC Long Term Goal  #3   Title  Incease bilateral shoulder active abduction to >/= 130 degrees for increased ease reaching.    Baseline  Rt 103 degrees (cording very limiting) and Lt 134 degrees-04/02/17; Rt 140 and Lt 153 degrees-05/13/17; Rt 144 and Lt 166 degrees-05/27/17    Status   Achieved      CC Long Term Goal  #4   Title  Patient will report  she has returned to running without increased edema in her right lateral trunk.    Baseline  Did run a half of the Thanksgiving [place] trot, but hasn't run since last chemo due to cold; Pt has gone running a few times up to 3 miles and reports this feeling good-05/13/17; Pt has been walk/running as she feels able now and reports no problems with this-05/27/17    Status  Achieved      CC Long Term Goal  #5   Title  Patient will verbalize understanding of lymphedema risk reduction practices.    Status  Achieved         Plan - 08/01/17 1152    Clinical Impression Statement  Pt continued to tolerate aggressive stretching today and was not limited by pain. She was also able to tolerate direct pressure to cording and by end of session minimal softening noted of tissue at pectoralis tendon in axilla and pt reported tissue feeling less tight as well. Cording overall continues to be persistent but pt tolerated increased stretching with less to no pain today which is improvement.     Rehab Potential  Excellent    Clinical Impairments Affecting Rehab Potential  finished chemo 07/25/17; due to start radiation 2nd week of April    PT Frequency  2x / week    PT Duration  4 weeks    PT Treatment/Interventions  ADLs/Self Care Home Management;Moist Heat;Therapeutic exercise;Patient/family education;Manual techniques;Manual lymph drainage;Scar mobilization;Passive range of motion;Taping    PT Next Visit Plan  Manual work to reduce cording: soft tissue mobilization, myofascial release; consider trying light heat before stretching, also consider thoracic mobility when in Lt S/L.    PT Home Exercise Plan  strength ABC program, right shoulder abduction stretch    Consulted and Agree with Plan of Care  Patient       Patient will benefit from skilled therapeutic intervention in order to improve the following deficits and impairments:  Decreased range  of motion, Pain, Increased fascial restricitons  Visit Diagnosis: Stiffness of right shoulder, not elsewhere classified  Aftercare following surgery for neoplasm  Other symptoms and signs involving the musculoskeletal system     Problem List Patient Active Problem List   Diagnosis Date Noted  . Cancer of central portion of right female breast ([place]) 01/28/2017  . 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, estrogen receptor positive ([place]) 01/08/2017    Otelia Limes, PTA 08/01/2017, 12:11 PM  [place], [place], [address] Phone: [phone]   Fax:  [phone]  Name: Angelica Floyd MRN: [id number] Date of Birth: 1966-03-26

## 2017-08-05 ENCOUNTER — Ambulatory Visit: Payer: BLUE CROSS/BLUE SHIELD | Admitting: Physical Therapy

## 2017-08-05 ENCOUNTER — Encounter (HOSPITAL_BASED_OUTPATIENT_CLINIC_OR_DEPARTMENT_OTHER): Payer: Self-pay | Admitting: *Deleted

## 2017-08-05 ENCOUNTER — Other Ambulatory Visit: Payer: Self-pay

## 2017-08-05 DIAGNOSIS — R29898 Other symptoms and signs involving the musculoskeletal system: Secondary | ICD-10-CM

## 2017-08-05 DIAGNOSIS — M25611 Stiffness of right shoulder, not elsewhere classified: Secondary | ICD-10-CM

## 2017-08-05 DIAGNOSIS — Z483 Aftercare following surgery for neoplasm: Secondary | ICD-10-CM

## 2017-08-05 NOTE — Pre-Procedure Instructions (Signed)
To come pick up Ensure pre-surgery drink 10 oz. - to drink by 0715 DOS.

## 2017-08-05 NOTE — Therapy (Signed)
[place], [place], [address] Phone: [phone]   Fax:  [phone]  Physical Therapy Treatment  Patient Details  Name: Angelica Floyd MRN: [id number] Date of Birth: 25-Jun-1965 Referring Provider: Dr. Nicholas Lose   Encounter Date: 08/05/2017  PT End of Session - 08/05/17 1714    Visit Number  7    Number of Visits  13    Date for PT Re-Evaluation  09/02/17    PT Start Time  9767    PT Stop Time  3419    PT Time Calculation (min)  47 min    Activity Tolerance  Patient tolerated treatment well    Behavior During Therapy  [facility] for tasks assessed/performed       Past Medical History:  Diagnosis Date  . Anxiety   . History of chemotherapy    finished chemo 07/29/2017  . 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, estrogen receptor positive ([place]) 01/08/2017  . Seasonal allergies     Past Surgical History:  Procedure Laterality Date  . ABDOMINAL HYSTERECTOMY     partial  . ADENOIDECTOMY W/ MYRINGOTOMY    . APPENDECTOMY    . AXILLARY LYMPH NODE DISSECTION Right 02/14/2017   Procedure: RIGHT AXILLARY LYMPH NODE DISSECTION ERAS PATHWAY;  Surgeon: Jovita Kussmaul, MD;  Location: [place];  Service: General;  Laterality: Right;  . CESAREAN SECTION    . MASTECTOMY W/ SENTINEL NODE BIOPSY Bilateral 01/28/2017   RIGHT BREAST BIOPSY  . MASTECTOMY W/ SENTINEL NODE BIOPSY Bilateral 01/28/2017   Procedure: BILATERAL MASTECTOMY WITH RIGHT SENTINEL LYMPH NODE BIOPSY;  Surgeon: Jovita Kussmaul, MD;  Location: [place];  Service: General;  Laterality: Bilateral;  . PORTACATH PLACEMENT Left 02/14/2017   Procedure: INSERTION PORT-A-CATH;  Surgeon: Jovita Kussmaul, MD;  Location: [place];  Service: General;  Laterality: Left;  . RE-EXCISION OF BREAST CANCER,SUPERIOR MARGINS N/A 02/14/2017   Procedure: RE-EXCISION INFERIOR FLAP;  Surgeon: Jovita Kussmaul, MD;  Location: [place];  Service: General;   Laterality: N/A;  . WISDOM TOOTH EXTRACTION      There were no vitals filed for this visit.  Subjective Assessment - 08/05/17 1519    Subjective  "It felt like maybe something gave a little bit the other day--I was in the shower and rubbing it and something gave way."    Pertinent History  Bilateral mastectomy for right hormone receptive breast cancer with right SLNB 01/28/17 followed by re-excision and ALND 02/14/17. Just finished chemo week of 07/22/17. Will start radiation in April. Otherwise healthy.    Currently in Pain?  No/denies         [facility] Main PT Assessment - 08/05/17 0001      AROM   Right Shoulder Flexion  138 Degrees prior to stretching, 142 followin    Right Shoulder ABduction  169 Degrees prior to stretching            No data recorded       [facility] Adult PT Treatment/Exercise - 08/05/17 0001      Manual Therapy   Soft tissue mobilization  to cords at right axilla; also across right mastectomy incision area    Myofascial Release  crosshands in vertical at right chest around mastectomy incision; attempted release across axilla but pressure at axilla and just inferior was painful; crosshands from right upper arm to right flank and from right upper arm to right mastectomy incision line; right UE pulling into flexion  with concurrent distraction on right chest distally    Passive ROM  In Supine: Rt shoulder to pts tolerance into flexion, abduction, and er               PT Short Term Goals - 07/22/17 1437      PT SHORT TERM GOAL #1   Title  Pt. will report perception of at least 30% decrease in right axillary cording.    Baseline  Maybe 30% better as of 07/22/17    Status  Achieved        PT Long Term Goals - 07/03/17 1731      PT LONG TERM GOAL #1   Title  Pt. will report perception of at least 60% decrease in right axillary cording.    Time  8    Period  Weeks    Status  New      PT LONG TERM GOAL #2   Title  Pt. will be independent in HEP with  stretches to minimize cording including right shoulder abduction.    Time  8    Period  Weeks    Status  New      PT LONG TERM GOAL #3   Title  right shoulder active flexion and abduction to at least 145 degrees each    Time  8    Period  [facility] Goals - 01/09/17 1430      Patient will be able to verbalize understanding of pertinent lymphedema risk reduction practices relevant to her diagnosis specifically related to skin care.   Time  1    Period  Days    Status  Achieved      Patient will be able to return demonstrate and/or verbalize understanding of the post-op home exercise program related to regaining shoulder range of motion.   Time  1    Period  Days    Status  Achieved      Patient will be able to verbalize understanding of the importance of attending the postoperative After Breast Cancer Class for further lymphedema risk reduction education and therapeutic exercise.   Time  1    Period  Days    Status  Achieved       Long Term Clinic Goals - 05/27/17 1226      CC Long Term Goal  #1   Title  Patient will be independent in her home exercise program for improving shoulder ROM.    Baseline  Progressed HEP to include Strength ABC Program today-05/13/17; pt is now consistent with all HEP-05/27/17    Status  Achieved      CC Long Term Goal  #2   Title  Incease bilateral shoulder active flexion to >/= 130 degrees for increased ease reaching.    Baseline  Rt 110 degrees (cording very limiting) and Lt 133 degrees=04/02/17; Rt 138 and Lt 151 degrees-05/13/17; Rt 147 and Lt 155 degrees-05/27/17    Status  Achieved      CC Long Term Goal  #3   Title  Incease bilateral shoulder active abduction to >/= 130 degrees for increased ease reaching.    Baseline  Rt 103 degrees (cording very limiting) and Lt 134 degrees-04/02/17; Rt 140 and Lt 153 degrees-05/13/17; Rt 144 and Lt 166 degrees-05/27/17    Status  Achieved      CC Long Term Goal  #4    Title  Patient will report she has  returned to running without increased edema in her right lateral trunk.    Baseline  Did run a half of the Thanksgiving [place] trot, but hasn't run since last chemo due to cold; Pt has gone running a few times up to 3 miles and reports this feeling good-05/13/17; Pt has been walk/running as she feels able now and reports no problems with this-05/27/17    Status  Achieved      CC Long Term Goal  #5   Title  Patient will verbalize understanding of lymphedema risk reduction practices.    Status  Achieved         Plan - 08/05/17 1715    Clinical Impression Statement  Pt. again with acute tenderness with direct pressure on cording at axilla area; tolerated stretching well.  AROM remains limited in flexion, with gain of just a few degrees within session.    Rehab Potential  Excellent    Clinical Impairments Affecting Rehab Potential  finished chemo 07/25/17; due to start radiation 2nd week of April    PT Frequency  2x / week    PT Duration  4 weeks    PT Treatment/Interventions  ADLs/Self Care Home Management;Moist Heat;Therapeutic exercise;Patient/family education;Manual techniques;Manual lymph drainage;Scar mobilization;Passive range of motion;Taping    PT Next Visit Plan  Check goals. Consider trying [place]-type tools for release of cording. Manual work to reduce cording: soft tissue mobilization, myofascial release; consider trying light heat before stretching, also consider thoracic mobility when in Lt S/L.    PT Home Exercise Plan  strength ABC program, right shoulder abduction stretch    Consulted and Agree with Plan of Care  Patient       Patient will benefit from skilled therapeutic intervention in order to improve the following deficits and impairments:  Decreased range of motion, Pain, Increased fascial restricitons  Visit Diagnosis: Stiffness of right shoulder, not elsewhere classified  Aftercare following surgery for neoplasm  Other symptoms  and signs involving the musculoskeletal system     Problem List Patient Active Problem List   Diagnosis Date Noted  . Cancer of central portion of right female breast ([place]) 01/28/2017  . 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, estrogen receptor positive ([place]) 01/08/2017    SALISBURY,DONNA 08/05/2017, 5:19 PM  [place], [place], [address] Phone: [phone]   Fax:  [phone]  Name: Angelica Floyd MRN: [id number] Date of Birth: 1965/08/04  Serafina Royals, PT 08/05/17 5:20 PM

## 2017-08-05 NOTE — Progress Notes (Signed)
Pt in to pick up Ensure Pre surgery drink.  Instructions reviewed.

## 2017-08-08 ENCOUNTER — Ambulatory Visit (HOSPITAL_BASED_OUTPATIENT_CLINIC_OR_DEPARTMENT_OTHER)
Admission: RE | Admit: 2017-08-08 | Discharge: 2017-08-08 | Disposition: A | Discharge status: Home / Self Care | Payer: BLUE CROSS/BLUE SHIELD | Source: Ambulatory Visit | Attending: General Surgery | Admitting: General Surgery

## 2017-08-08 ENCOUNTER — Encounter (HOSPITAL_BASED_OUTPATIENT_CLINIC_OR_DEPARTMENT_OTHER)
Admission: RE | Disposition: A | Discharge status: Home / Self Care | Payer: Self-pay | Source: Ambulatory Visit | Attending: General Surgery

## 2017-08-08 ENCOUNTER — Ambulatory Visit (HOSPITAL_BASED_OUTPATIENT_CLINIC_OR_DEPARTMENT_OTHER): Payer: BLUE CROSS/BLUE SHIELD | Admitting: Anesthesiology

## 2017-08-08 ENCOUNTER — Other Ambulatory Visit: Payer: Self-pay

## 2017-08-08 ENCOUNTER — Encounter (HOSPITAL_BASED_OUTPATIENT_CLINIC_OR_DEPARTMENT_OTHER): Payer: Self-pay | Admitting: Anesthesiology

## 2017-08-08 DIAGNOSIS — Z79810 Long term (current) use of selective estrogen receptor modulators (SERMs): Secondary | ICD-10-CM | POA: Insufficient documentation

## 2017-08-08 DIAGNOSIS — Z79899 Other long term (current) drug therapy: Secondary | ICD-10-CM | POA: Insufficient documentation

## 2017-08-08 DIAGNOSIS — Z853 Personal history of malignant neoplasm of breast: Secondary | ICD-10-CM | POA: Insufficient documentation

## 2017-08-08 DIAGNOSIS — Z452 Encounter for adjustment and management of vascular access device: Secondary | ICD-10-CM | POA: Insufficient documentation

## 2017-08-08 DIAGNOSIS — Z79891 Long term (current) use of opiate analgesic: Secondary | ICD-10-CM | POA: Diagnosis not present

## 2017-08-08 DIAGNOSIS — Z4001 Encounter for prophylactic removal of breast: Secondary | ICD-10-CM | POA: Insufficient documentation

## 2017-08-08 HISTORY — PX: PORT-A-CATH REMOVAL: SHX5289

## 2017-08-08 HISTORY — DX: Personal history of antineoplastic chemotherapy: Z92.21

## 2017-08-08 HISTORY — DX: Other seasonal allergic rhinitis: J30.2

## 2017-08-08 SURGERY — REMOVAL PORT-A-CATH
Anesthesia: Monitor Anesthesia Care | Site: Chest | Laterality: Left

## 2017-08-08 MED ORDER — ONDANSETRON HCL 4 MG/2ML IJ SOLN
INTRAMUSCULAR | Status: DC | PRN
  Administered 2017-08-08: 4 mg via INTRAVENOUS

## 2017-08-08 MED ORDER — LIDOCAINE HCL (PF) 1 % IJ SOLN
INTRAMUSCULAR | Status: AC
  Filled 2017-08-08: qty 30

## 2017-08-08 MED ORDER — LACTATED RINGERS IV SOLN
INTRAVENOUS | Status: DC
  Administered 2017-08-08: 10:00:00 via INTRAVENOUS

## 2017-08-08 MED ORDER — CHLORHEXIDINE GLUCONATE CLOTH 2 % EX PADS: 6.0000 | MEDICATED_PAD | Freq: Once | CUTANEOUS | Status: DC

## 2017-08-08 MED ORDER — DEXAMETHASONE SODIUM PHOSPHATE 10 MG/ML IJ SOLN
INTRAMUSCULAR | Status: DC | PRN
  Administered 2017-08-08: 5 mg via INTRAVENOUS

## 2017-08-08 MED ORDER — MIDAZOLAM HCL 2 MG/2ML IJ SOLN
1.0000 mg | INTRAMUSCULAR | Status: DC | PRN
  Administered 2017-08-08: 2 mg via INTRAVENOUS

## 2017-08-08 MED ORDER — HYDROCODONE-ACETAMINOPHEN 5-325 MG PO TABS: 1.0000 | ORAL_TABLET | Freq: Four times a day (QID) | ORAL | 0 refills | Status: DC | PRN

## 2017-08-08 MED ORDER — LIDOCAINE 2% (20 MG/ML) 5 ML SYRINGE
INTRAMUSCULAR | Status: DC | PRN
  Administered 2017-08-08: 40 mg via INTRAVENOUS

## 2017-08-08 MED ORDER — FENTANYL CITRATE (PF) 100 MCG/2ML IJ SOLN
50.0000 ug | INTRAMUSCULAR | Status: DC | PRN
  Administered 2017-08-08: 100 ug via INTRAVENOUS

## 2017-08-08 MED ORDER — FENTANYL CITRATE (PF) 100 MCG/2ML IJ SOLN
INTRAMUSCULAR | Status: AC
Start: 2017-08-08 — End: 2017-08-08
  Filled 2017-08-08: qty 2

## 2017-08-08 MED ORDER — MIDAZOLAM HCL 2 MG/2ML IJ SOLN
INTRAMUSCULAR | Status: AC
  Filled 2017-08-08: qty 2

## 2017-08-08 MED ORDER — KETOROLAC TROMETHAMINE 30 MG/ML IJ SOLN
INTRAMUSCULAR | Status: DC | PRN
  Administered 2017-08-08: 30 mg via INTRAVENOUS

## 2017-08-08 MED ORDER — SCOPOLAMINE 1 MG/3DAYS TD PT72: 1.0000 | MEDICATED_PATCH | Freq: Once | TRANSDERMAL | Status: DC | PRN

## 2017-08-08 MED ORDER — PROPOFOL 10 MG/ML IV BOLUS
INTRAVENOUS | Status: DC | PRN
  Administered 2017-08-08: 20 mg via INTRAVENOUS
  Administered 2017-08-08 (×2): 10 mg via INTRAVENOUS

## 2017-08-08 MED ORDER — LIDOCAINE HCL 1 % IJ SOLN
INTRAMUSCULAR | Status: DC | PRN
  Administered 2017-08-08: 10 mL via INTRAMUSCULAR

## 2017-08-08 SURGICAL SUPPLY — 26 items
BLADE SURG 15 STRL LF DISP TIS (BLADE) ×1 IMPLANT
BLADE SURG 15 STRL SS (BLADE) ×2
CHLORAPREP W/TINT 26ML (MISCELLANEOUS) ×3 IMPLANT
COVER BACK TABLE 60X90IN (DRAPES) ×3 IMPLANT
COVER MAYO STAND STRL (DRAPES) ×3 IMPLANT
DECANTER SPIKE VIAL GLASS SM (MISCELLANEOUS) ×3 IMPLANT
DERMABOND ADVANCED (GAUZE/BANDAGES/DRESSINGS) ×2
DERMABOND ADVANCED .7 DNX12 (GAUZE/BANDAGES/DRESSINGS) ×1 IMPLANT
DRAPE LAPAROTOMY 100X72 PEDS (DRAPES) ×3 IMPLANT
DRAPE UTILITY XL STRL (DRAPES) ×3 IMPLANT
ELECT COATED BLADE 2.86 ST (ELECTRODE) IMPLANT
ELECT REM PT RETURN 9FT ADLT (ELECTROSURGICAL)
ELECTRODE REM PT RTRN 9FT ADLT (ELECTROSURGICAL) IMPLANT
GLOVE BIO SURGEON STRL SZ7.5 (GLOVE) ×3 IMPLANT
GOWN STRL REUS W/ TWL LRG LVL3 (GOWN DISPOSABLE) ×2 IMPLANT
GOWN STRL REUS W/TWL LRG LVL3 (GOWN DISPOSABLE) ×4
NEEDLE HYPO 25X1 1.5 SAFETY (NEEDLE) ×3 IMPLANT
PACK BASIN DAY SURGERY FS (CUSTOM PROCEDURE TRAY) ×3 IMPLANT
PENCIL BUTTON HOLSTER BLD 10FT (ELECTRODE) IMPLANT
SLEEVE SCD COMPRESS KNEE MED (MISCELLANEOUS) IMPLANT
SUT MON AB 4-0 PC3 18 (SUTURE) ×3 IMPLANT
SUT VIC AB 3-0 SH 27 (SUTURE) ×2
SUT VIC AB 3-0 SH 27X BRD (SUTURE) ×1 IMPLANT
SYR CONTROL 10ML LL (SYRINGE) ×3 IMPLANT
TOWEL OR 17X24 6PK STRL BLUE (TOWEL DISPOSABLE) ×3 IMPLANT
TOWEL OR NON WOVEN STRL DISP B (DISPOSABLE) ×3 IMPLANT

## 2017-08-08 NOTE — Discharge Instructions (Signed)

## 2017-08-08 NOTE — Anesthesia Preprocedure Evaluation (Addendum)
Anesthesia Evaluation  Patient identified by MRN, date of birth, ID band Patient awake    Reviewed: Allergy & Precautions, NPO status , Patient's Chart, lab work & pertinent test results  Airway Mallampati: II  TM Distance: >3 FB Neck ROM: Full    Dental  (+) Teeth Intact, Dental Advisory Given, Caps,    Pulmonary neg pulmonary ROS,    Pulmonary exam normal breath sounds clear to auscultation       Cardiovascular negative cardio ROS Normal cardiovascular exam Rhythm:Regular Rate:Normal     Neuro/Psych PSYCHIATRIC DISORDERS Anxiety negative neurological ROS     GI/Hepatic negative GI ROS, Neg liver ROS,   Endo/Other  negative endocrine ROS  Renal/GU negative Renal ROS     Musculoskeletal negative musculoskeletal ROS (+)   Abdominal   Peds  Hematology negative hematology ROS (+)   Anesthesia Other Findings Day of surgery medications reviewed with the patient.  Right breast cancer  Reproductive/Obstetrics                            Anesthesia Physical Anesthesia Plan  ASA: II  Anesthesia Plan: MAC   Post-op Pain Management:    Induction: Intravenous  PONV Risk Score and Plan: 2 and Propofol infusion, Treatment may vary due to age or medical condition and Midazolam  Airway Management Planned: Simple Face Mask  Additional Equipment:   Intra-op Plan:   Post-operative Plan:   Informed Consent: I have reviewed the patients History and Physical, chart, labs and discussed the procedure including the risks, benefits and alternatives for the proposed anesthesia with the patient or authorized representative who has indicated his/her understanding and acceptance.   Dental advisory given  Plan Discussed with: CRNA and Anesthesiologist  Anesthesia Plan Comments:         Anesthesia Quick Evaluation

## 2017-08-08 NOTE — Anesthesia Procedure Notes (Signed)
Procedure Name: MAC Performed by: Karmina Zufall W, CRNA Pre-anesthesia Checklist: Patient identified, Emergency Drugs available, Suction available, Patient being monitored and Timeout performed Patient Re-evaluated:Patient Re-evaluated prior to induction Oxygen Delivery Method: Simple face mask       

## 2017-08-08 NOTE — Anesthesia Postprocedure Evaluation (Signed)
Anesthesia Post Note  Patient: Angelica Floyd  Procedure(s) Performed: REMOVAL PORT-A-CATH (Left Chest)     Patient location during evaluation: PACU Anesthesia Type: MAC Level of consciousness: awake and alert Pain management: pain level controlled Vital Signs Assessment: post-procedure vital signs reviewed and stable Respiratory status: spontaneous breathing, nonlabored ventilation and respiratory function stable Cardiovascular status: stable and blood pressure returned to baseline Postop Assessment: no apparent nausea or vomiting Anesthetic complications: no    Last Vitals:  Vitals:   08/08/17 1126 08/08/17 1145  BP:  121/72  Pulse: 91 95  Resp: 16 16  Temp:  36.7 C  SpO2: 99% 100%    Last Pain:  Vitals:   08/08/17 1145  TempSrc:   PainSc: 0-No pain                 Catalina Gravel

## 2017-08-08 NOTE — H&P (Signed)
Angelica Floyd  Location: [facility] Patient #: [id number] DOB: Sep 24, 1965 Married / Language: English / Race: White Female   History of Present Illness The patient is a 52 year old female who presents for a follow-up for Breast cancer. The patient is a 52 year old white female who is about 4 months status post right modified radical mastectomy for a T2 N3a right breast cancer that was ER and PR positive and HER-2 negative with a Ki-67 of 3%. She also had a left prophylactic mastectomy. She tolerated the surgery well. At this point she has 6 more weeks of chemotherapy to go. This will be followed by radiation therapy. Her only complaint is of some cording in the right axilla which has improved with physical therapy.   Allergies  No Known Allergies  Allergies Reconciled   Medication History  Breast Prosthesis (1 (one) as needed, Taken starting 03/12/2017) Active. (Mastectomy products) Compression Sleeve (1 (one) as needed, Taken starting 04/01/2017) Active. (Compression sleeve for right arm) Hydrocodone-Acetaminophen (5-'325MG'$  Tablet, Oral) Active. LORazepam (0.'5MG'$  Tablet, Oral) Active. Azithromycin ('250MG'$  Tablet, Oral) Active. Dexamethasone ('4MG'$  Tablet, Oral) Active. Lidocaine-Prilocaine (2.5-2.5% Cream, External) Active. Methocarbamol ('750MG'$  Tablet, Oral) Active. Ondansetron HCl ('8MG'$  Tablet, Oral) Active. Prochlorperazine Maleate ('10MG'$  Tablet, Oral) Active. Tamoxifen Citrate ('20MG'$  Tablet, Oral) Active. Medications Reconciled    Review of Systems  General Present- Weight Loss. Not Present- Appetite Loss, Chills, Fatigue, Fever, Night Sweats and Weight Gain. Skin Not Present- Change in Wart/Mole, Dryness, Hives, Jaundice, New Lesions, Non-Healing Wounds, Rash and Ulcer. HEENT Present- Wears glasses/contact lenses. Not Present- Earache, Hearing Loss, Hoarseness, Nose Bleed, Oral Ulcers, Ringing in the Ears, Seasonal Allergies, Sinus Pain, Sore Throat,  Visual Disturbances and Yellow Eyes. Respiratory Present- Snoring. Not Present- Bloody sputum, Chronic Cough, Difficulty Breathing and Wheezing. Breast Present- Breast Mass and Breast Pain. Not Present- Nipple Discharge and Skin Changes. Cardiovascular Not Present- Chest Pain, Difficulty Breathing Lying Down, Leg Cramps, Palpitations, Rapid Heart Rate, Shortness of Breath and Swelling of Extremities. Gastrointestinal Present- Change in Bowel Habits and Hemorrhoids. Not Present- Abdominal Pain, Bloating, Bloody Stool, Chronic diarrhea, Constipation, Difficulty Swallowing, Excessive gas, Gets full quickly at meals, Indigestion, Nausea, Rectal Pain and Vomiting. Female Genitourinary Not Present- Frequency, Nocturia, Painful Urination, Pelvic Pain and Urgency. Musculoskeletal Not Present- Back Pain, Joint Pain, Joint Stiffness, Muscle Pain, Muscle Weakness and Swelling of Extremities. Neurological Not Present- Decreased Memory, Fainting, Headaches, Numbness, Seizures, Tingling, Tremor, Trouble walking and Weakness. Psychiatric Present- Change in Sleep Pattern. Not Present- Anxiety, Bipolar, Depression, Fearful and Frequent crying. Endocrine Not Present- Cold Intolerance, Excessive Hunger, Hair Changes, Heat Intolerance, Hot flashes and New Diabetes. Hematology Not Present- Blood Thinners, Easy Bruising, Excessive bleeding, Gland problems, HIV and Persistent Infections.  Vitals  Weight: 120.38 lb Height: 63in Body Surface Area: 1.56 m Body Mass Index: 21.32 kg/m  Temp.: 33F(Oral)  Pulse: 110 (Regular)  BP: 138/82 (Sitting, Left Arm, Standard)       Physical Exam  General Mental Status-Alert. General Appearance-Consistent with stated age. Hydration-Well hydrated. Voice-Normal.  Head and Neck Head-normocephalic, atraumatic with no lesions or palpable masses. Trachea-midline. Thyroid Gland Characteristics - normal size and consistency.  Eye Eyeball -  Bilateral-Extraocular movements intact. Sclera/Conjunctiva - Bilateral-No scleral icterus.  Chest and Lung Exam Chest and lung exam reveals -quiet, even and easy respiratory effort with no use of accessory muscles and on auscultation, normal breath sounds, no adventitious sounds and normal vocal resonance. Inspection Chest Wall - Normal. Back - normal.  Breast Note: Both mastectomy incisions are  healing nicely with no sign of infection or seroma. The skin flaps are healthy. There is no palpable mass on either chest wall. There is no palpable axillary, supraclavicular, or cervical lymphadenopathy. There is still a palpable cord in the right axilla   Cardiovascular Cardiovascular examination reveals -normal heart sounds, regular rate and rhythm with no murmurs and normal pedal pulses bilaterally.  Abdomen Inspection Inspection of the abdomen reveals - No Hernias. Skin - Scar - no surgical scars. Palpation/Percussion Palpation and Percussion of the abdomen reveal - Soft, Non Tender, No Rebound tenderness, No Rigidity (guarding) and No hepatosplenomegaly. Auscultation Auscultation of the abdomen reveals - Bowel sounds normal.  Neurologic Neurologic evaluation reveals -alert and oriented x 3 with no impairment of recent or remote memory. Mental Status-Normal.  Musculoskeletal Normal Exam - Left-Upper Extremity Strength Normal and Lower Extremity Strength Normal. Normal Exam - Right-Upper Extremity Strength Normal and Lower Extremity Strength Normal.  Lymphatic Head & Neck  General Head & Neck Lymphatics: Bilateral - Description - Normal. Axillary  General Axillary Region: Bilateral - Description - Normal. Tenderness - Non Tender. Femoral & Inguinal  Generalized Femoral & Inguinal Lymphatics: Bilateral - Description - Normal. Tenderness - Non Tender.    Assessment & Plan  MALIGNANT NEOPLASM OF LOWER-OUTER QUADRANT OF RIGHT BREAST OF FEMALE, ESTROGEN RECEPTOR  POSITIVE (C50.511) Impression: The patient is about 4 months status post right modified radical mastectomy and left prophylactic mastectomy for an invasive right breast cancer. She tolerated the surgery well. At this point she continues to do well with no clinical evidence of recurrence. She will continue with 6 more weeks of chemotherapy followed by radiation therapy. I will plan to see her back in about 3 months to check her progress. She will continue to work on stretching and massaging the cords in the right axilla Current Plans Follow up with [place] in the office in 3 MONTHS.  Call us sooner as needed.

## 2017-08-08 NOTE — Op Note (Signed)
08/08/2017  11:13 AM  PATIENT:  Angelica Floyd  52 y.o. female  PRE-OPERATIVE DIAGNOSIS:  RIGHT BREAST CANCER  POST-OPERATIVE DIAGNOSIS:  RIGHT BREAST CANCER  PROCEDURE:  Procedure(s): REMOVAL PORT-A-CATH (Left)  SURGEON:  Surgeon(s) and Role:    * Jovita Kussmaul, MD - Primary  PHYSICIAN ASSISTANT:   ASSISTANTS: none   ANESTHESIA:   IV sedation  EBL:  1 mL   BLOOD ADMINISTERED:none  DRAINS: none   LOCAL MEDICATIONS USED:  MARCAINE    and LIDOCAINE   SPECIMEN:  No Specimen  DISPOSITION OF SPECIMEN:  N/A  COUNTS:  YES  TOURNIQUET:  * No tourniquets in log *  DICTATION: .Dragon Dictation   After informed consent was obtained the patient was brought to the operating room and placed in the supine position on the operating table.  After IV sedation had been given the patient's left chest was prepped with ChloraPrep, allowed to dry, and draped in usual sterile manner.  An appropriate timeout was performed.  The area around the port was infiltrated with the combination of 1% lidocaine and quarter percent Marcaine until a good field block was created.  A small incision was made with a 15 blade knife through her previous incision.  The incision was carried through the subcutaneous tissue sharply with a 15 blade knife until the capsule surrounding the port was opened.  The 2 anchoring stitches were divided and removed.  The port was then pushed out of its pocket and with gentle traction was removed from the patient without difficulty.  Pressure was held on the area for several minutes until it was completely hemostatic.  The deep layer of the wound was then closed with interrupted 3-0 Vicryl stitches.  The skin was then closed with a running 4-0 Monocryl subcuticular stitch.  Dermabond dressings were applied.  The patient tolerated the procedure well.  At the end of the case all needle sponge and instrument counts were correct.  The patient was then awakened and taken to recovery in  stable condition.  PLAN OF CARE: Discharge to home after PACU  PATIENT DISPOSITION:  PACU - hemodynamically stable.   Delay start of Pharmacological VTE agent (>24hrs) due to surgical blood loss or risk of bleeding: not applicable

## 2017-08-08 NOTE — Interval H&P Note (Signed)
History and Physical Interval Note:  08/08/2017 10:15 AM  Angelica Floyd  has presented today for surgery, with the diagnosis of RIGHT BREAST CANCER  The various methods of treatment have been discussed with the patient and family. After consideration of risks, benefits and other options for treatment, the patient has consented to  Procedure(s): REMOVAL PORT-A-CATH (N/A) as a surgical intervention .  The patient's history has been reviewed, patient examined, no change in status, stable for surgery.  I have reviewed the patient's chart and labs.  Questions were answered to the patient's satisfaction.     TOTH III,Windy Dudek S

## 2017-08-08 NOTE — Transfer of Care (Signed)
Immediate Anesthesia Transfer of Care Note  Patient: Angelica Floyd  Procedure(s) Performed: REMOVAL PORT-A-CATH (Left Chest)  Patient Location: PACU  Anesthesia Type:MAC  Level of Consciousness: awake, alert  and oriented  Airway & Oxygen Therapy: Patient Spontanous Breathing  Post-op Assessment: Report given to RN and Post -op Vital signs reviewed and stable  Post vital signs: Reviewed and stable  Last Vitals:  Vitals Value Taken Time  BP 109/78 08/08/2017 11:08 AM  Temp    Pulse 90 08/08/2017 11:09 AM  Resp 15 08/08/2017 11:09 AM  SpO2 95 % 08/08/2017 11:09 AM  Vitals shown include unvalidated device data.  Last Pain:  Vitals:   08/08/17 0948  TempSrc: Oral  PainSc: 0-No pain         Complications: No apparent anesthesia complications

## 2017-08-09 ENCOUNTER — Encounter (HOSPITAL_BASED_OUTPATIENT_CLINIC_OR_DEPARTMENT_OTHER): Payer: Self-pay | Admitting: General Surgery

## 2017-08-12 ENCOUNTER — Ambulatory Visit
Admission: RE | Admit: 2017-08-12 | Discharge: 2017-08-12 | Disposition: A | Discharge status: Home / Self Care | Payer: BLUE CROSS/BLUE SHIELD | Source: Ambulatory Visit | Attending: Radiation Oncology | Admitting: Radiation Oncology

## 2017-08-12 DIAGNOSIS — Z510 Encounter for antineoplastic radiation therapy: Secondary | ICD-10-CM | POA: Diagnosis not present

## 2017-08-12 DIAGNOSIS — Z170 Estrogen receptor positive status [ER+]: Secondary | ICD-10-CM | POA: Diagnosis not present

## 2017-08-12 DIAGNOSIS — C50511 Malignant neoplasm of lower-outer quadrant of right female breast: Secondary | ICD-10-CM | POA: Insufficient documentation

## 2017-08-12 DIAGNOSIS — Z923 Personal history of irradiation: Secondary | ICD-10-CM | POA: Diagnosis not present

## 2017-08-12 NOTE — Progress Notes (Signed)
  Radiation Oncology         [phone]) [phone] ________________________________  Name: Angelica Floyd MRN: [id number]  Date: 08/12/2017  DOB: [birth year]  SIMULATION AND TREATMENT PLANNING NOTE    ICD-10-CM   1. 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, estrogen receptor positive ([place]) C50.511    Z17.0     DIAGNOSIS: 52 year-old woman with invasive lobular carcinoma of the right breast,Stage IIIA (pT2, pN3a, cM0, G1, ER+, PR+, HER2-)  NARRATIVE:  The patient was brought to the [facility].  Identity was confirmed.  All relevant records and images related to the planned course of therapy were reviewed.  The patient freely provided informed written consent to proceed with treatment after reviewing the details related to the planned course of therapy. The consent form was witnessed and verified by the simulation staff.  Then, the patient was set-up in a stable reproducible  supine position for radiation therapy.  CT images were obtained.  Surface markings were placed.  The CT images were loaded into the planning software.  Then the target and avoidance structures were contoured.  Treatment planning then occurred.  The radiation prescription was entered and confirmed.  Then, I designed and supervised the construction of a total of 5 medically necessary complex treatment devices.  I have requested : 3D Simulation  I have requested a DVH of the following structures: heart, lungs, chest wall volume, spinal cord.  I have ordered:CBC  PLAN:  The patient will receive 50 gray in 25 fractions directed at the right chest wall area. The axillary region will receive 45 gray in 25 fractions area.  the patient will then proceed with a boost to the mastectomy scar region of 10 gray for a cumulative dose of 60 gray.   -----------------------------------   Optical Surface Tracking Plan:  Since intensity modulated radiotherapy (IMRT) and 3D conformal radiation treatment  methods are predicated on accurate and precise positioning for treatment, intrafraction motion monitoring is medically necessary to ensure accurate and safe treatment delivery.  The ability to quantify intrafraction motion without excessive ionizing radiation dose can only be performed with optical surface tracking. Accordingly, surface imaging offers the opportunity to obtain 3D measurements of patient position throughout IMRT and 3D treatments without excessive radiation exposure.  I am ordering optical surface tracking for this patient's upcoming course of radiotherapy. ________________________________      Blair Promise, PhD, MD  This document serves as a record of services personally performed by Gery Pray, MD. It was created on his behalf by Bethann Humble, a trained medical scribe. The creation of this record is based on the scribe's personal observations and the provider's statements to them. This document has been checked and approved by the attending provider.

## 2017-08-13 ENCOUNTER — Ambulatory Visit: Payer: BLUE CROSS/BLUE SHIELD | Attending: Hematology and Oncology

## 2017-08-13 DIAGNOSIS — M25611 Stiffness of right shoulder, not elsewhere classified: Secondary | ICD-10-CM | POA: Diagnosis not present

## 2017-08-13 DIAGNOSIS — R29898 Other symptoms and signs involving the musculoskeletal system: Secondary | ICD-10-CM | POA: Diagnosis present

## 2017-08-13 DIAGNOSIS — Z483 Aftercare following surgery for neoplasm: Secondary | ICD-10-CM | POA: Insufficient documentation

## 2017-08-13 NOTE — Therapy (Signed)
[place], [place], [address] Phone: [phone]   Fax:  [phone]  Physical Therapy Treatment  Patient Details  Name: EBONEE STOBER MRN: [id number] Date of Birth: 01/19/1966 Referring Provider: Dr. Nicholas Lose   Encounter Date: 08/13/2017  PT End of Session - 08/13/17 0845    Visit Number  8    Number of Visits  13    Date for PT Re-Evaluation  09/02/17    PT Start Time  0802    PT Stop Time  0843    PT Time Calculation (min)  41 min    Activity Tolerance  Patient tolerated treatment well    Behavior During Therapy  [facility] for tasks assessed/performed       Past Medical History:  Diagnosis Date  . Anxiety   . History of chemotherapy    finished chemo 07/29/2017  . 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, estrogen receptor positive ([place]) 01/08/2017  . Seasonal allergies     Past Surgical History:  Procedure Laterality Date  . ABDOMINAL HYSTERECTOMY     partial  . ADENOIDECTOMY W/ MYRINGOTOMY    . APPENDECTOMY    . AXILLARY LYMPH NODE DISSECTION Right 02/14/2017   Procedure: RIGHT AXILLARY LYMPH NODE DISSECTION ERAS PATHWAY;  Surgeon: Jovita Kussmaul, MD;  Location: [place];  Service: General;  Laterality: Right;  . CESAREAN SECTION    . MASTECTOMY W/ SENTINEL NODE BIOPSY Bilateral 01/28/2017   RIGHT BREAST BIOPSY  . MASTECTOMY W/ SENTINEL NODE BIOPSY Bilateral 01/28/2017   Procedure: BILATERAL MASTECTOMY WITH RIGHT SENTINEL LYMPH NODE BIOPSY;  Surgeon: Jovita Kussmaul, MD;  Location: [place];  Service: General;  Laterality: Bilateral;  . PORT-A-CATH REMOVAL Left 08/08/2017   Procedure: REMOVAL PORT-A-CATH;  Surgeon: Jovita Kussmaul, MD;  Location: [place];  Service: General;  Laterality: Left;  . PORTACATH PLACEMENT Left 02/14/2017   Procedure: INSERTION PORT-A-CATH;  Surgeon: Jovita Kussmaul, MD;  Location: [place];  Service: General;  Laterality: Left;  .  RE-EXCISION OF BREAST CANCER,SUPERIOR MARGINS N/A 02/14/2017   Procedure: RE-EXCISION INFERIOR FLAP;  Surgeon: Jovita Kussmaul, MD;  Location: [place];  Service: General;  Laterality: N/A;  . WISDOM TOOTH EXTRACTION      There were no vitals filed for this visit.      [facility] PT Assessment - 08/13/17 0001      AROM   Right Shoulder Flexion  151 Degrees after stretching    Right Shoulder ABduction  161 Degrees                   [facility] Adult PT Treatment/Exercise - 08/13/17 0001      Manual Therapy   Soft tissue mobilization  to cords at right axilla using Graston tool at times during session; also soft tissue to posterior axilla where pt was c/o of tightness and max tenderness, trigger point release and good softening of tissue by end of session    Myofascial Release  To Rt UE with pulling into abduction, then to Rt axillary cording with slow, long holds, also using Graston tool at times    Passive ROM  In Supine: Rt shoulder to pts tolerance into flexion and abduction               PT Short Term Goals - 07/22/17 1437      PT SHORT TERM GOAL #1   Title  Pt. will report perception  of at least 30% decrease in right axillary cording.    Baseline  Maybe 30% better as of 07/22/17    Status  Achieved        PT Long Term Goals - 08/13/17 0806      PT LONG TERM GOAL #1   Title  Pt. will report perception of at least 60% decrease in right axillary cording.    Baseline  Pt reports 50% improvement thus far-08/13/17    Status  On-going      PT LONG TERM GOAL #2   Title  Pt. will be independent in HEP with stretches to minimize cording including right shoulder abduction.    Baseline  Pt doing this throughout day-08/13/17    Status  Achieved      PT LONG TERM GOAL #3   Title  right shoulder active flexion and abduction to at least 145 degrees each    Baseline  Rt shoulder flexion 151 and abduction 161 degrees-08/13/17    Status  Achieved      Breast Clinic Goals -  01/09/17 1430      Patient will be able to verbalize understanding of pertinent lymphedema risk reduction practices relevant to her diagnosis specifically related to skin care.   Time  1    Period  Days    Status  Achieved      Patient will be able to return demonstrate and/or verbalize understanding of the post-op home exercise program related to regaining shoulder range of motion.   Time  1    Period  Days    Status  Achieved      Patient will be able to verbalize understanding of the importance of attending the postoperative After Breast Cancer Class for further lymphedema risk reduction education and therapeutic exercise.   Time  1    Period  Days    Status  Achieved       Long Term Clinic Goals - 05/27/17 1226      CC Long Term Goal  #1   Title  Patient will be independent in her home exercise program for improving shoulder ROM.    Baseline  Progressed HEP to include Strength ABC Program today-05/13/17; pt is now consistent with all HEP-05/27/17    Status  Achieved      CC Long Term Goal  #2   Title  Incease bilateral shoulder active flexion to >/= 130 degrees for increased ease reaching.    Baseline  Rt 110 degrees (cording very limiting) and Lt 133 degrees=04/02/17; Rt 138 and Lt 151 degrees-05/13/17; Rt 147 and Lt 155 degrees-05/27/17    Status  Achieved      CC Long Term Goal  #3   Title  Incease bilateral shoulder active abduction to >/= 130 degrees for increased ease reaching.    Baseline  Rt 103 degrees (cording very limiting) and Lt 134 degrees-04/02/17; Rt 140 and Lt 153 degrees-05/13/17; Rt 144 and Lt 166 degrees-05/27/17    Status  Achieved      CC Long Term Goal  #4   Title  Patient will report she has returned to running without increased edema in her right lateral trunk.    Baseline  Did run a half of the Thanksgiving [place] trot, but hasn't run since last chemo due to cold; Pt has gone running a few times up to 3 miles and reports this feeling good-05/13/17; Pt  has been walk/running as she feels able now and reports no problems with this-05/27/17  Status  Achieved      CC Long Term Goal  #5   Title  Patient will verbalize understanding of lymphedema risk reduction practices.    Status  Achieved         Plan - 08/13/17 0845    Clinical Impression Statement  Used Graston tool at times during manual therapy today for increased stretch with cording, but also for soft tissue at axilla. Pt also had increased palpable tightness at posterior axilla so focused manual therapy here with trigger point release. Pts tenderness was much improved by end of session and her A/ROM ended up being much improved from last week with flexion having increased 10 151 degrees from 138. Pt is placing herself onhold as hse starts radiation next week and would like to be able to come back for assess after radiation.    Rehab Potential  Excellent    Clinical Impairments Affecting Rehab Potential  finished chemo 07/25/17; due to start radiation 08/30/17    PT Frequency  2x / week    PT Duration  4 weeks    PT Treatment/Interventions  ADLs/Self Care Home Management;Moist Heat;Therapeutic exercise;Patient/family education;Manual techniques;Manual lymph drainage;Scar mobilization;Passive range of motion;Taping    PT Next Visit Plan  Reassess pt once she returns from finishing radiation    Consulted and Agree with Plan of Care  Patient       Patient will benefit from skilled therapeutic intervention in order to improve the following deficits and impairments:  Decreased range of motion, Pain, Increased fascial restricitons  Visit Diagnosis: Stiffness of right shoulder, not elsewhere classified  Aftercare following surgery for neoplasm  Other symptoms and signs involving the musculoskeletal system     Problem List Patient Active Problem List   Diagnosis Date Noted  . Cancer of central portion of right female breast ([place]) 01/28/2017  . Malignant neoplasm of lower-outer  quadrant of right breast of female, estrogen receptor positive ([place]) 01/08/2017    Otelia Limes, PTA 08/13/2017, 8:50 AM  [place], [place], [address] Phone: [phone]   Fax:  [phone]  Name: LIORA MYLES MRN: [id number] Date of Birth: [birth year]

## 2017-08-14 DIAGNOSIS — C50511 Malignant neoplasm of lower-outer quadrant of right female breast: Secondary | ICD-10-CM | POA: Diagnosis not present

## 2017-08-19 ENCOUNTER — Ambulatory Visit
Admission: RE | Admit: 2017-08-19 | Discharge: 2017-08-19 | Disposition: A | Discharge status: Home / Self Care | Payer: BLUE CROSS/BLUE SHIELD | Source: Ambulatory Visit | Attending: Radiation Oncology | Admitting: Radiation Oncology

## 2017-08-19 DIAGNOSIS — Z170 Estrogen receptor positive status [ER+]: Principal | ICD-10-CM

## 2017-08-19 DIAGNOSIS — C50511 Malignant neoplasm of lower-outer quadrant of right female breast: Secondary | ICD-10-CM | POA: Diagnosis not present

## 2017-08-19 NOTE — Progress Notes (Signed)
  Radiation Oncology         [phone]) [phone] ________________________________  Name: Angelica Floyd MRN: [id number]  Date: 08/19/2017  DOB: [birth year]  Simulation Verification Note    ICD-10-CM   1. 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, estrogen receptor positive ([place]) C50.511    Z17.0     Status: outpatient  NARRATIVE: The patient was brought to the treatment unit and placed in the planned treatment position. The clinical setup was verified. Then port films were obtained and uploaded to the radiation oncology medical record software.  The treatment beams were carefully compared against the planned radiation fields. The position location and shape of the radiation fields was reviewed. They targeted volume of tissue appears to be appropriately covered by the radiation beams. Organs at risk appear to be excluded as planned.  Based on my personal review, I approved the simulation verification. The patient's treatment will proceed as planned.  -----------------------------------  Blair Promise, PhD, MD

## 2017-08-20 ENCOUNTER — Ambulatory Visit
Admission: RE | Admit: 2017-08-20 | Discharge: 2017-08-20 | Disposition: A | Discharge status: Home / Self Care | Payer: BLUE CROSS/BLUE SHIELD | Source: Ambulatory Visit | Attending: Radiation Oncology | Admitting: Radiation Oncology

## 2017-08-20 DIAGNOSIS — C50511 Malignant neoplasm of lower-outer quadrant of right female breast: Secondary | ICD-10-CM

## 2017-08-20 DIAGNOSIS — Z170 Estrogen receptor positive status [ER+]: Principal | ICD-10-CM

## 2017-08-20 MED ORDER — ALRA NON-METALLIC DEODORANT (RAD-ONC)
1.0000 "application " | Freq: Once | TOPICAL | Status: AC
  Administered 2017-08-20: 1 via TOPICAL

## 2017-08-20 MED ORDER — RADIAPLEXRX EX GEL
Freq: Two times a day (BID) | CUTANEOUS | Status: DC
  Administered 2017-08-20: 12:00:00 via TOPICAL

## 2017-08-20 NOTE — Progress Notes (Signed)

## 2017-08-21 ENCOUNTER — Ambulatory Visit
Admission: RE | Admit: 2017-08-21 | Discharge: 2017-08-21 | Disposition: A | Discharge status: Home / Self Care | Payer: BLUE CROSS/BLUE SHIELD | Source: Ambulatory Visit | Attending: Radiation Oncology | Admitting: Radiation Oncology

## 2017-08-21 DIAGNOSIS — C50511 Malignant neoplasm of lower-outer quadrant of right female breast: Secondary | ICD-10-CM | POA: Diagnosis not present

## 2017-08-22 ENCOUNTER — Ambulatory Visit
Admission: RE | Admit: 2017-08-22 | Discharge: 2017-08-22 | Disposition: A | Discharge status: Home / Self Care | Payer: BLUE CROSS/BLUE SHIELD | Source: Ambulatory Visit | Attending: Radiation Oncology | Admitting: Radiation Oncology

## 2017-08-22 DIAGNOSIS — C50511 Malignant neoplasm of lower-outer quadrant of right female breast: Secondary | ICD-10-CM | POA: Diagnosis not present

## 2017-08-23 ENCOUNTER — Ambulatory Visit
Admission: RE | Admit: 2017-08-23 | Discharge: 2017-08-23 | Disposition: A | Discharge status: Home / Self Care | Payer: BLUE CROSS/BLUE SHIELD | Source: Ambulatory Visit | Attending: Radiation Oncology | Admitting: Radiation Oncology

## 2017-08-23 DIAGNOSIS — C50511 Malignant neoplasm of lower-outer quadrant of right female breast: Secondary | ICD-10-CM | POA: Diagnosis not present

## 2017-08-26 ENCOUNTER — Ambulatory Visit
Admission: RE | Admit: 2017-08-26 | Discharge: 2017-08-26 | Disposition: A | Discharge status: Home / Self Care | Payer: BLUE CROSS/BLUE SHIELD | Source: Ambulatory Visit | Attending: Radiation Oncology | Admitting: Radiation Oncology

## 2017-08-26 DIAGNOSIS — C50511 Malignant neoplasm of lower-outer quadrant of right female breast: Secondary | ICD-10-CM | POA: Diagnosis not present

## 2017-08-27 ENCOUNTER — Ambulatory Visit
Admission: RE | Admit: 2017-08-27 | Discharge: 2017-08-27 | Disposition: A | Discharge status: Home / Self Care | Payer: BLUE CROSS/BLUE SHIELD | Source: Ambulatory Visit | Attending: Radiation Oncology | Admitting: Radiation Oncology

## 2017-08-27 DIAGNOSIS — C50511 Malignant neoplasm of lower-outer quadrant of right female breast: Secondary | ICD-10-CM | POA: Diagnosis not present

## 2017-08-28 ENCOUNTER — Ambulatory Visit
Admission: RE | Admit: 2017-08-28 | Discharge: 2017-08-28 | Disposition: A | Discharge status: Home / Self Care | Payer: BLUE CROSS/BLUE SHIELD | Source: Ambulatory Visit | Attending: Radiation Oncology | Admitting: Radiation Oncology

## 2017-08-28 DIAGNOSIS — C50511 Malignant neoplasm of lower-outer quadrant of right female breast: Secondary | ICD-10-CM | POA: Diagnosis not present

## 2017-08-29 ENCOUNTER — Ambulatory Visit
Admission: RE | Admit: 2017-08-29 | Discharge: 2017-08-29 | Disposition: A | Discharge status: Home / Self Care | Payer: BLUE CROSS/BLUE SHIELD | Source: Ambulatory Visit | Attending: Radiation Oncology | Admitting: Radiation Oncology

## 2017-08-29 DIAGNOSIS — C50511 Malignant neoplasm of lower-outer quadrant of right female breast: Secondary | ICD-10-CM | POA: Diagnosis not present

## 2017-08-29 DIAGNOSIS — Z170 Estrogen receptor positive status [ER+]: Principal | ICD-10-CM

## 2017-08-30 ENCOUNTER — Ambulatory Visit: Payer: BLUE CROSS/BLUE SHIELD

## 2017-09-02 ENCOUNTER — Ambulatory Visit: Payer: BLUE CROSS/BLUE SHIELD

## 2017-09-03 ENCOUNTER — Ambulatory Visit
Admission: RE | Admit: 2017-09-03 | Discharge: 2017-09-03 | Disposition: A | Discharge status: Home / Self Care | Payer: BLUE CROSS/BLUE SHIELD | Source: Ambulatory Visit | Attending: Radiation Oncology | Admitting: Radiation Oncology

## 2017-09-03 DIAGNOSIS — C50511 Malignant neoplasm of lower-outer quadrant of right female breast: Secondary | ICD-10-CM

## 2017-09-03 DIAGNOSIS — Z170 Estrogen receptor positive status [ER+]: Principal | ICD-10-CM

## 2017-09-03 MED ORDER — SONAFINE EX EMUL
1.0000 "application " | Freq: Two times a day (BID) | CUTANEOUS | Status: DC
  Administered 2017-09-03: 1 via TOPICAL

## 2017-09-04 ENCOUNTER — Ambulatory Visit
Admission: RE | Admit: 2017-09-04 | Discharge: 2017-09-04 | Disposition: A | Discharge status: Home / Self Care | Payer: BLUE CROSS/BLUE SHIELD | Source: Ambulatory Visit | Attending: Radiation Oncology | Admitting: Radiation Oncology

## 2017-09-04 DIAGNOSIS — C50511 Malignant neoplasm of lower-outer quadrant of right female breast: Secondary | ICD-10-CM | POA: Diagnosis not present

## 2017-09-05 ENCOUNTER — Ambulatory Visit
Admission: RE | Admit: 2017-09-05 | Discharge: 2017-09-05 | Disposition: A | Discharge status: Home / Self Care | Payer: BLUE CROSS/BLUE SHIELD | Source: Ambulatory Visit | Attending: Radiation Oncology | Admitting: Radiation Oncology

## 2017-09-05 DIAGNOSIS — C50511 Malignant neoplasm of lower-outer quadrant of right female breast: Secondary | ICD-10-CM | POA: Diagnosis not present

## 2017-09-06 ENCOUNTER — Ambulatory Visit
Admission: RE | Admit: 2017-09-06 | Discharge: 2017-09-06 | Disposition: A | Discharge status: Home / Self Care | Payer: BLUE CROSS/BLUE SHIELD | Source: Ambulatory Visit | Attending: Radiation Oncology | Admitting: Radiation Oncology

## 2017-09-06 DIAGNOSIS — C50511 Malignant neoplasm of lower-outer quadrant of right female breast: Secondary | ICD-10-CM | POA: Diagnosis not present

## 2017-09-09 ENCOUNTER — Ambulatory Visit
Admission: RE | Admit: 2017-09-09 | Discharge: 2017-09-09 | Disposition: A | Discharge status: Home / Self Care | Payer: BLUE CROSS/BLUE SHIELD | Source: Ambulatory Visit | Attending: Radiation Oncology | Admitting: Radiation Oncology

## 2017-09-09 DIAGNOSIS — C50511 Malignant neoplasm of lower-outer quadrant of right female breast: Secondary | ICD-10-CM | POA: Diagnosis not present

## 2017-09-10 ENCOUNTER — Ambulatory Visit: Payer: BLUE CROSS/BLUE SHIELD

## 2017-09-10 ENCOUNTER — Ambulatory Visit
Admission: RE | Admit: 2017-09-10 | Discharge: 2017-09-10 | Disposition: A | Discharge status: Home / Self Care | Payer: BLUE CROSS/BLUE SHIELD | Source: Ambulatory Visit | Attending: Radiation Oncology | Admitting: Radiation Oncology

## 2017-09-10 DIAGNOSIS — C50511 Malignant neoplasm of lower-outer quadrant of right female breast: Secondary | ICD-10-CM | POA: Diagnosis not present

## 2017-09-11 ENCOUNTER — Ambulatory Visit
Admission: RE | Admit: 2017-09-11 | Discharge: 2017-09-11 | Disposition: A | Discharge status: Home / Self Care | Payer: BLUE CROSS/BLUE SHIELD | Source: Ambulatory Visit | Attending: Radiation Oncology | Admitting: Radiation Oncology

## 2017-09-11 ENCOUNTER — Ambulatory Visit: Payer: BLUE CROSS/BLUE SHIELD

## 2017-09-11 DIAGNOSIS — Z510 Encounter for antineoplastic radiation therapy: Secondary | ICD-10-CM | POA: Diagnosis not present

## 2017-09-11 DIAGNOSIS — Z170 Estrogen receptor positive status [ER+]: Secondary | ICD-10-CM | POA: Insufficient documentation

## 2017-09-11 DIAGNOSIS — C50511 Malignant neoplasm of lower-outer quadrant of right female breast: Secondary | ICD-10-CM | POA: Insufficient documentation

## 2017-09-12 ENCOUNTER — Ambulatory Visit
Admission: RE | Admit: 2017-09-12 | Discharge: 2017-09-12 | Disposition: A | Discharge status: Home / Self Care | Payer: BLUE CROSS/BLUE SHIELD | Source: Ambulatory Visit | Attending: Radiation Oncology | Admitting: Radiation Oncology

## 2017-09-12 DIAGNOSIS — C50511 Malignant neoplasm of lower-outer quadrant of right female breast: Secondary | ICD-10-CM | POA: Diagnosis not present

## 2017-09-13 ENCOUNTER — Ambulatory Visit
Admission: RE | Admit: 2017-09-13 | Discharge: 2017-09-13 | Disposition: A | Discharge status: Home / Self Care | Payer: BLUE CROSS/BLUE SHIELD | Source: Ambulatory Visit | Attending: Radiation Oncology | Admitting: Radiation Oncology

## 2017-09-13 DIAGNOSIS — C50511 Malignant neoplasm of lower-outer quadrant of right female breast: Secondary | ICD-10-CM | POA: Diagnosis not present

## 2017-09-16 ENCOUNTER — Ambulatory Visit
Admission: RE | Admit: 2017-09-16 | Discharge: 2017-09-16 | Disposition: A | Discharge status: Home / Self Care | Payer: BLUE CROSS/BLUE SHIELD | Source: Ambulatory Visit | Attending: Radiation Oncology | Admitting: Radiation Oncology

## 2017-09-16 ENCOUNTER — Ambulatory Visit: Payer: BLUE CROSS/BLUE SHIELD | Admitting: Radiation Oncology

## 2017-09-16 DIAGNOSIS — C50511 Malignant neoplasm of lower-outer quadrant of right female breast: Secondary | ICD-10-CM | POA: Diagnosis not present

## 2017-09-17 ENCOUNTER — Ambulatory Visit
Admission: RE | Admit: 2017-09-17 | Discharge: 2017-09-17 | Disposition: A | Discharge status: Home / Self Care | Payer: BLUE CROSS/BLUE SHIELD | Source: Ambulatory Visit | Attending: Radiation Oncology | Admitting: Radiation Oncology

## 2017-09-17 DIAGNOSIS — C50511 Malignant neoplasm of lower-outer quadrant of right female breast: Secondary | ICD-10-CM | POA: Diagnosis not present

## 2017-09-17 DIAGNOSIS — Z170 Estrogen receptor positive status [ER+]: Principal | ICD-10-CM

## 2017-09-17 NOTE — Progress Notes (Signed)
Simulation note  The patient was brought to the treatment room for simulation for the patient's upcoming electron treatment. The patient was setup in the treatment position and the target region was delineated. The patient will receive treatment to the righ  chest wall using an en face electron field. One customized block/complex treatment device has been constructed for this purpose, and this will be used on a daily basis during the patient's treatment. After appropriate set up was confirmed, skin markings were placed to allow accurate targeting of the treatment area during the patient's course of therapy.  ------------------------------------------------  -----------------------------------  Blair Promise, PhD, MD

## 2017-09-18 ENCOUNTER — Ambulatory Visit
Admission: RE | Admit: 2017-09-18 | Discharge: 2017-09-18 | Disposition: A | Discharge status: Home / Self Care | Payer: BLUE CROSS/BLUE SHIELD | Source: Ambulatory Visit | Attending: Radiation Oncology | Admitting: Radiation Oncology

## 2017-09-18 DIAGNOSIS — C50511 Malignant neoplasm of lower-outer quadrant of right female breast: Secondary | ICD-10-CM | POA: Diagnosis not present

## 2017-09-19 ENCOUNTER — Ambulatory Visit
Admission: RE | Admit: 2017-09-19 | Discharge: 2017-09-19 | Disposition: A | Discharge status: Home / Self Care | Payer: BLUE CROSS/BLUE SHIELD | Source: Ambulatory Visit | Attending: Radiation Oncology | Admitting: Radiation Oncology

## 2017-09-19 DIAGNOSIS — C50511 Malignant neoplasm of lower-outer quadrant of right female breast: Secondary | ICD-10-CM | POA: Diagnosis not present

## 2017-09-20 ENCOUNTER — Ambulatory Visit
Admission: RE | Admit: 2017-09-20 | Discharge: 2017-09-20 | Disposition: A | Discharge status: Home / Self Care | Payer: BLUE CROSS/BLUE SHIELD | Source: Ambulatory Visit | Attending: Radiation Oncology | Admitting: Radiation Oncology

## 2017-09-20 DIAGNOSIS — C50511 Malignant neoplasm of lower-outer quadrant of right female breast: Secondary | ICD-10-CM | POA: Diagnosis not present

## 2017-09-23 ENCOUNTER — Ambulatory Visit: Payer: BLUE CROSS/BLUE SHIELD | Admitting: Radiation Oncology

## 2017-09-23 ENCOUNTER — Ambulatory Visit
Admission: RE | Admit: 2017-09-23 | Discharge: 2017-09-23 | Disposition: A | Discharge status: Home / Self Care | Payer: BLUE CROSS/BLUE SHIELD | Source: Ambulatory Visit | Attending: Radiation Oncology | Admitting: Radiation Oncology

## 2017-09-23 ENCOUNTER — Ambulatory Visit: Payer: BLUE CROSS/BLUE SHIELD

## 2017-09-23 DIAGNOSIS — C50511 Malignant neoplasm of lower-outer quadrant of right female breast: Secondary | ICD-10-CM | POA: Diagnosis not present

## 2017-09-24 ENCOUNTER — Ambulatory Visit
Admission: RE | Admit: 2017-09-24 | Discharge: 2017-09-24 | Disposition: A | Discharge status: Home / Self Care | Payer: BLUE CROSS/BLUE SHIELD | Source: Ambulatory Visit | Attending: Radiation Oncology | Admitting: Radiation Oncology

## 2017-09-24 ENCOUNTER — Ambulatory Visit: Payer: BLUE CROSS/BLUE SHIELD | Admitting: Radiation Oncology

## 2017-09-24 ENCOUNTER — Ambulatory Visit: Payer: BLUE CROSS/BLUE SHIELD

## 2017-09-24 DIAGNOSIS — C50511 Malignant neoplasm of lower-outer quadrant of right female breast: Secondary | ICD-10-CM | POA: Diagnosis not present

## 2017-09-25 ENCOUNTER — Ambulatory Visit
Admission: RE | Admit: 2017-09-25 | Discharge: 2017-09-25 | Disposition: A | Discharge status: Home / Self Care | Payer: BLUE CROSS/BLUE SHIELD | Source: Ambulatory Visit | Attending: Radiation Oncology | Admitting: Radiation Oncology

## 2017-09-25 ENCOUNTER — Ambulatory Visit: Payer: BLUE CROSS/BLUE SHIELD

## 2017-09-25 DIAGNOSIS — C50511 Malignant neoplasm of lower-outer quadrant of right female breast: Secondary | ICD-10-CM | POA: Diagnosis not present

## 2017-09-25 DIAGNOSIS — Z170 Estrogen receptor positive status [ER+]: Principal | ICD-10-CM

## 2017-09-25 MED ORDER — SONAFINE EX EMUL: 1.0000 "application " | Freq: Once | CUTANEOUS | Status: DC

## 2017-09-25 MED ORDER — SILVER SULFADIAZINE 1 % EX CREA
TOPICAL_CREAM | Freq: Two times a day (BID) | CUTANEOUS | Status: DC
  Administered 2017-09-25: 10:00:00 via TOPICAL

## 2017-09-25 MED ORDER — SONAFINE EX EMUL
1.0000 "application " | Freq: Once | CUTANEOUS | Status: AC
  Administered 2017-09-25: 1 via TOPICAL

## 2017-09-25 NOTE — Progress Notes (Signed)
.  Simulation verification  The patient was brought to the treatment machine and placed in the plan treatment position.  Clinical set up was verified to ensure that the target region is appropriately covered for the patient's upcoming electron boost treatment.  The targeted volume of tissue is appropriately covered by the radiation field.  Based on my personal review, I approve the simulation verification.  The patient's treatment will proceed as planned.  ------------------------------------------------  -----------------------------------  Nivaan Dicenzo D. Razan Siler, PhD, MD  

## 2017-09-26 ENCOUNTER — Ambulatory Visit
Admission: RE | Admit: 2017-09-26 | Discharge: 2017-09-26 | Disposition: A | Discharge status: Home / Self Care | Payer: BLUE CROSS/BLUE SHIELD | Source: Ambulatory Visit | Attending: Radiation Oncology | Admitting: Radiation Oncology

## 2017-09-26 ENCOUNTER — Ambulatory Visit: Payer: BLUE CROSS/BLUE SHIELD

## 2017-09-26 DIAGNOSIS — C50511 Malignant neoplasm of lower-outer quadrant of right female breast: Secondary | ICD-10-CM | POA: Diagnosis not present

## 2017-09-27 ENCOUNTER — Ambulatory Visit: Payer: BLUE CROSS/BLUE SHIELD

## 2017-09-27 ENCOUNTER — Ambulatory Visit
Admission: RE | Admit: 2017-09-27 | Discharge: 2017-09-27 | Disposition: A | Discharge status: Home / Self Care | Payer: BLUE CROSS/BLUE SHIELD | Source: Ambulatory Visit | Attending: Radiation Oncology | Admitting: Radiation Oncology

## 2017-09-27 DIAGNOSIS — C50511 Malignant neoplasm of lower-outer quadrant of right female breast: Secondary | ICD-10-CM | POA: Diagnosis not present

## 2017-09-30 ENCOUNTER — Encounter: Payer: Self-pay | Admitting: *Deleted

## 2017-09-30 ENCOUNTER — Ambulatory Visit: Payer: BLUE CROSS/BLUE SHIELD

## 2017-09-30 ENCOUNTER — Ambulatory Visit
Admission: RE | Admit: 2017-09-30 | Discharge: 2017-09-30 | Disposition: A | Discharge status: Home / Self Care | Payer: BLUE CROSS/BLUE SHIELD | Source: Ambulatory Visit | Attending: Radiation Oncology | Admitting: Radiation Oncology

## 2017-09-30 DIAGNOSIS — C50511 Malignant neoplasm of lower-outer quadrant of right female breast: Secondary | ICD-10-CM | POA: Diagnosis not present

## 2017-10-01 ENCOUNTER — Inpatient Hospital Stay: Payer: BLUE CROSS/BLUE SHIELD | Admitting: Hematology and Oncology

## 2017-10-01 ENCOUNTER — Ambulatory Visit
Admission: RE | Admit: 2017-10-01 | Discharge: 2017-10-01 | Disposition: A | Discharge status: Home / Self Care | Payer: BLUE CROSS/BLUE SHIELD | Source: Ambulatory Visit | Attending: Radiation Oncology | Admitting: Radiation Oncology

## 2017-10-01 DIAGNOSIS — C50511 Malignant neoplasm of lower-outer quadrant of right female breast: Secondary | ICD-10-CM | POA: Diagnosis not present

## 2017-10-02 ENCOUNTER — Ambulatory Visit
Admission: RE | Admit: 2017-10-02 | Discharge: 2017-10-02 | Disposition: A | Discharge status: Home / Self Care | Payer: BLUE CROSS/BLUE SHIELD | Source: Ambulatory Visit | Attending: Radiation Oncology | Admitting: Radiation Oncology

## 2017-10-02 ENCOUNTER — Encounter: Payer: Self-pay | Admitting: Radiation Oncology

## 2017-10-02 DIAGNOSIS — C50511 Malignant neoplasm of lower-outer quadrant of right female breast: Secondary | ICD-10-CM | POA: Diagnosis not present

## 2017-10-02 NOTE — Progress Notes (Signed)
  Radiation Oncology         [phone] ________________________________  Name: Angelica Floyd MRN: [id number]  Date: 10/02/2017  DOB: 1965-06-22  End of Treatment Note  Diagnosis: 52 year-old woman with invasive lobular carcinoma of the right breast,Stage IIIA (pT2, pN3a, cM0, G1, ER+, PR+, HER2-)   Indication for treatment: Curative       Radiation treatment dates: 08/20/17-10/02/17  Site/dose: 1) Right chest wall/ 50 Gy in 25 fractions 2) Right axilla/ 45 Gy in 25 fractions 3) Right chest wall boost/ 10 Gy in 5 fractions  Beams/energy: 1) 3D/ 10X, 6X 2) 3D/ 6X 3) Clinical set-up technique/ 6E electrons  Narrative: The patient tolerated radiation treatment relatively well. During treatment the patient exhibited diffuse erythema and hyperpigmentation changes. There was mild moist desquamation to the axillary region and chest wall area. The patient complained of pain under the right arm related to skin peeling as well as itching to the treatment area. moist desquamation responded well to antibiotic cream  Plan: The patient has completed radiation treatment. The patient will return to radiation oncology clinic for routine followup in 2 weeks. I advised them to call or return sooner if they have any questions or concerns related to their recovery or treatment.  -----------------------------------  Blair Promise, PhD, MD  This document serves as a record of services personally performed by Gery Pray, MD. It was created on his behalf by Bethann Humble, a trained medical scribe. The creation of this record is based on the scribe's personal observations and the provider's statements to them. This document has been checked and approved by the attending provider.

## 2017-10-04 ENCOUNTER — Inpatient Hospital Stay: Payer: BLUE CROSS/BLUE SHIELD | Attending: Hematology and Oncology | Admitting: Hematology and Oncology

## 2017-10-04 ENCOUNTER — Telehealth: Payer: Self-pay | Admitting: Hematology and Oncology

## 2017-10-04 VITALS — BP 128/85 | HR 97 | Temp 97.5°F | Resp 18 | Ht 63.0 in | Wt 120.4 lb

## 2017-10-04 DIAGNOSIS — C773 Secondary and unspecified malignant neoplasm of axilla and upper limb lymph nodes: Secondary | ICD-10-CM | POA: Insufficient documentation

## 2017-10-04 DIAGNOSIS — C50511 Malignant neoplasm of lower-outer quadrant of right female breast: Secondary | ICD-10-CM | POA: Insufficient documentation

## 2017-10-04 DIAGNOSIS — Z923 Personal history of irradiation: Secondary | ICD-10-CM | POA: Insufficient documentation

## 2017-10-04 DIAGNOSIS — Z9221 Personal history of antineoplastic chemotherapy: Secondary | ICD-10-CM | POA: Insufficient documentation

## 2017-10-04 DIAGNOSIS — Z79811 Long term (current) use of aromatase inhibitors: Secondary | ICD-10-CM | POA: Diagnosis not present

## 2017-10-04 DIAGNOSIS — Z170 Estrogen receptor positive status [ER+]: Secondary | ICD-10-CM

## 2017-10-04 DIAGNOSIS — L598 Other specified disorders of the skin and subcutaneous tissue related to radiation: Secondary | ICD-10-CM | POA: Diagnosis not present

## 2017-10-04 DIAGNOSIS — Z79899 Other long term (current) drug therapy: Secondary | ICD-10-CM | POA: Diagnosis not present

## 2017-10-04 DIAGNOSIS — Z780 Asymptomatic menopausal state: Secondary | ICD-10-CM | POA: Diagnosis not present

## 2017-10-04 DIAGNOSIS — Z9013 Acquired absence of bilateral breasts and nipples: Secondary | ICD-10-CM | POA: Diagnosis not present

## 2017-10-04 MED ORDER — LETROZOLE 2.5 MG PO TABS: 2.5000 mg | ORAL_TABLET | Freq: Every day | ORAL | 3 refills | Status: DC

## 2017-10-04 NOTE — Telephone Encounter (Signed)
Gave patient AVS and calendar of upcoming august appointments °

## 2017-10-04 NOTE — Assessment & Plan Note (Signed)
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 adjuvant chemotherapy with dose dense Adriamycin and Cytoxan x4 followed by Taxol weekly x12 completed 07/25/17 3. Adjuvant radiation 08/20/17- 10/02/17 4. Adjuvant antiestrogen therapy with tamoxifen (which was originally started prior to surgery) resumed 10/12/17 ---------------------------------------------------------------------- I discussed with the patient to restart Tamoxifen until shes in menopause and then switch to aromatase inhibitors. Previously she tolerated Tamoxifen extremely well. RTC in 3 months for SCP visit

## 2017-10-04 NOTE — Progress Notes (Signed)
Patient Care Team: Jettie Booze, NP as PCP - General (Family Medicine) Nicholas Lose, MD as Consulting Physician (Hematology and Oncology) Jovita Kussmaul, MD as Consulting Physician (General Surgery) Gery Pray, MD as Consulting Physician (Radiation Oncology)  DIAGNOSIS:  Encounter Diagnosis  Name Primary?  . 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, estrogen receptor positive ([place])     SUMMARY OF ONCOLOGIC HISTORY:   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, estrogen receptor positive ([place])   01/02/2017 Initial Diagnosis    Palpable right breast mass retroareolar 6:30 position: 3.6 cm size axilla negative, 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 HER-2 negative ratio 1.31 Ki-67 3% in addition calcifications UIQ 1.4 cm stereotactic biopsy flat epithelial atypia; clips are 4.3 cm apart, T2 N0 stage IB clinical stage AJCC 8      01/28/2017 Surgery    Bilateral mastectomies: Right: Grade 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%, PR 95%, HER-2 negative ratio 1.31, Ki-67 3%, 4/4 lymph nodes positive; left mastectomy: PASH and FC changes, no malignancy; T2 N2,  stage IIA AJCC 8      02/14/2017 Surgery    Right axillary lymph node dissection 8/11 lymph nodes positive      03/06/2017 - 07/25/2017 Chemotherapy    Dose dense AC x4 followed by Taxol x12      08/20/2017 - 10/02/2017 Radiation Therapy    Adj XRT       CHIEF COMPLIANT: Follow-up after adjuvant radiation therapy  INTERVAL HISTORY: Angelica Floyd is a 41-year with above-mentioned history of right breast cancer treated with bilateral mastectomy followed by adjuvant chemotherapy and radiation.  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  She is here to discuss adjuvant antiestrogen treatment plan.  REVIEW OF SYSTEMS:   Constitutional: Denies fevers, chills or abnormal weight loss Eyes: Denies blurriness of vision Ears, nose, mouth, throat, and face: Denies  mucositis or sore throat Respiratory: Denies cough, dyspnea or wheezes Cardiovascular: Denies palpitation, chest discomfort Gastrointestinal:  Denies nausea, heartburn or change in bowel habits Skin: Denies abnormal skin rashes Lymphatics: Denies new lymphadenopathy or easy bruising Neurological:Denies numbness, tingling or new weaknesses Behavioral/Psych: Mood is stable, no new changes  Extremities: No lower extremity edema Breast: Severe radiation dermatitis All other systems were reviewed with the patient and are negative.  I have reviewed the past medical history, past surgical history, social history and family history with the patient and they are unchanged from previous note.  ALLERGIES:  has No Known Allergies.  MEDICATIONS:  Current Outpatient Medications  Medication Sig Dispense Refill  . acetaminophen (TYLENOL) 325 MG tablet Take 650 mg by mouth every 6 (six) hours as needed.    Marland Kitchen HYDROcodone-acetaminophen (NORCO/VICODIN) 5-325 MG tablet Take 1-2 tablets by mouth every 6 (six) hours as needed for moderate pain or severe pain. 10 tablet 0  . LORazepam (ATIVAN) 0.5 MG tablet Take 1 tablet (0.5 mg total) by mouth at bedtime. 30 tablet 0  . Multiple Vitamin (MULTIVITAMIN) tablet Take 1 tablet by mouth daily.     No current facility-administered medications for this visit.     PHYSICAL EXAMINATION: ECOG PERFORMANCE STATUS: 1 - Symptomatic but completely ambulatory  Vitals:   10/04/17 0919  BP: 128/85  Pulse: 97  Resp: 18  Temp: (!) 97.5 F (36.4 C)  SpO2: 100%   Filed Weights   10/04/17 0919  Weight: 120 lb 6.4 oz (54.6 kg)    GENERAL:alert, no  distress and comfortable SKIN: skin color, texture, turgor are normal, no rashes or significant lesions EYES: normal, Conjunctiva are pink and non-injected, sclera clear OROPHARYNX:no exudate, no erythema and lips, buccal mucosa, and tongue normal  NECK: supple, thyroid normal size, non-tender, without nodularity LYMPH:   no palpable lymphadenopathy in the cervical, axillary or inguinal LUNGS: clear to auscultation and percussion with normal breathing effort HEART: regular rate & rhythm and no murmurs and no lower extremity edema ABDOMEN:abdomen soft, non-tender and normal bowel sounds MUSCULOSKELETAL:no cyanosis of digits and no clubbing  NEURO: alert & oriented x 3 with fluent speech, no focal motor/sensory deficits EXTREMITIES: No lower extremity edema  LABORATORY DATA:  I have reviewed the data as listed CMP Latest Ref Rng & Units 07/25/2017 07/18/2017 07/11/2017  Glucose 70 - 140 mg/dL 103 101 106  BUN 7 - 26 mg/dL '12 9 9  '$ Creatinine 0.60 - 1.10 mg/dL 0.83 0.87 0.84  Sodium 136 - 145 mmol/L 140 139 139  Potassium 3.5 - 5.1 mmol/L 3.8 3.6 3.8  Chloride 98 - 109 mmol/L 105 104 104  CO2 22 - 29 mmol/L '27 26 27  '$ Calcium 8.4 - 10.4 mg/dL 9.7 9.6 9.7  Total Protein 6.4 - 8.3 g/dL 6.7 6.7 6.7  Total Bilirubin 0.2 - 1.2 mg/dL 0.3 0.4 0.3  Alkaline Phos 40 - 150 U/L 54 57 63  AST 5 - 34 U/L '22 23 24  '$ ALT 0 - 55 U/L '23 27 26    '$ Lab Results  Component Value Date   WBC 3.1 (L) 07/25/2017   HGB 12.0 07/25/2017   HCT 35.0 07/25/2017   MCV 92.6 07/25/2017   PLT 233 07/25/2017   NEUTROABS 1.8 07/25/2017    ASSESSMENT & PLAN:  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, estrogen receptor positive ([place]) 01/28/2017: Bilateral mastectomies: Right: 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 adjuvant chemotherapy with dose dense Adriamycin and Cytoxan x4 followed by Taxol weekly x12  completed 07/25/17 3. Adjuvant radiation 08/20/17- 10/02/17 4. Adjuvant antiestrogen therapy with tamoxifen (which was originally started prior to surgery) switched to letrozole to be started 10/26/2017 ---------------------------------------------------------------------- Patient is menopausal based upon the blood work done in March. Previously she tolerated Tamoxifen extremely well.  Letrozole counseling: We discussed the risks and benefits of anti-estrogen therapy with aromatase inhibitors. These include but not limited to insomnia, hot flashes, mood changes, vaginal dryness, bone density loss, and weight gain. We strongly believe that the benefits far outweigh the risks. Patient understands these risks and consented to starting treatment. Planned treatment duration is 7 years.  RTC in 3 months for SCP visit     No orders of the defined types were placed in this encounter.  The patient has a good understanding of the overall plan. she agrees with it. she will call with any problems that may develop before the next visit here.   Harriette Ohara, MD 10/04/17

## 2017-10-17 ENCOUNTER — Encounter: Payer: Self-pay | Admitting: Radiation Oncology

## 2017-10-17 ENCOUNTER — Other Ambulatory Visit: Payer: Self-pay

## 2017-10-17 ENCOUNTER — Ambulatory Visit
Admission: RE | Admit: 2017-10-17 | Discharge: 2017-10-17 | Disposition: A | Discharge status: Home / Self Care | Payer: BLUE CROSS/BLUE SHIELD | Source: Ambulatory Visit | Attending: Radiation Oncology | Admitting: Radiation Oncology

## 2017-10-17 VITALS — BP 137/96 | HR 87 | Temp 98.0°F | Resp 20 | Ht 63.0 in | Wt 120.0 lb

## 2017-10-17 DIAGNOSIS — Z170 Estrogen receptor positive status [ER+]: Secondary | ICD-10-CM | POA: Diagnosis not present

## 2017-10-17 DIAGNOSIS — Z79899 Other long term (current) drug therapy: Secondary | ICD-10-CM | POA: Diagnosis not present

## 2017-10-17 DIAGNOSIS — C50511 Malignant neoplasm of lower-outer quadrant of right female breast: Secondary | ICD-10-CM | POA: Diagnosis not present

## 2017-10-17 DIAGNOSIS — Z923 Personal history of irradiation: Secondary | ICD-10-CM | POA: Diagnosis not present

## 2017-10-17 NOTE — Progress Notes (Signed)
Pt here today for a 2 week follow-up for right breast cancer. Pt denies having any pain. Pt denies having any fatigue. Pt states Pt states that she has darkening and some redness under her right arm. Pt states that most of the skin has finished peeling. Pt states that she is using neosporin and aquaphor.  BP (!) 137/96 (BP Location: Left Arm, Patient Position: Sitting, Cuff Size: Normal)   Pulse 87   Temp 98 F (36.7 C) (Oral)   Resp 20   Ht 5\' 3"  (1.6 m)   Wt 120 lb (54.4 kg)   SpO2 100%   BMI 21.26 kg/m    Wt Readings from Last 3 Encounters:  10/17/17 120 lb (54.4 kg)  10/04/17 120 lb 6.4 oz (54.6 kg)  08/08/17 120 lb 12.8 oz (54.8 kg)

## 2017-10-17 NOTE — Progress Notes (Signed)
Radiation Oncology         [phone] ________________________________  Name: Angelica Floyd MRN: [id number]  Date: 10/17/2017  DOB: [birth year]  Follow-Up Visit Note  CC: Jettie Booze, NP  Nicholas Lose, MD    ICD-10-CM   1. 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, estrogen receptor positive ([place]) C50.511    Z17.0     Diagnosis:  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, estrogen receptor positive ([place])  Interval Since Last Radiation: 2 weeks, 08/20/2017 - 10/02/2017  Narrative:  The patient returns today for close follow-up. She had developed a brisk reaction at the completion of her treatment. She notes energy levels doing well, almost 100%. She will go back to physical therapy next week.  On review of systems, patient notes soreness in right armpit, and minimal itching at radiation site. Patient denies swelling, tingling, or itching in arm, coughing or trouble breathing, and any other symptoms.  She plans to take trips to [place], [place], and [place] over the 4th of July week.    ALLERGIES:  has No Known Allergies.  Meds: Current Outpatient Medications  Medication Sig Dispense Refill  . acetaminophen (TYLENOL) 325 MG tablet Take 650 mg by mouth every 6 (six) hours as needed.    Marland Kitchen HYDROcodone-acetaminophen (NORCO/VICODIN) 5-325 MG tablet Take 1-2 tablets by mouth every 6 (six) hours as needed for moderate pain or severe pain. 10 tablet 0  . [START ON 10/26/2017] letrozole (FEMARA) 2.5 MG tablet Take 1 tablet (2.5 mg total) by mouth daily. 90 tablet 3  . LORazepam (ATIVAN) 0.5 MG tablet Take 1 tablet (0.5 mg total) by mouth at bedtime. 30 tablet 0  . Multiple Vitamin (MULTIVITAMIN) tablet Take 1 tablet by mouth daily.     No current facility-administered medications for this encounter.     Physical Findings: The patient is in no acute distress. Patient is alert and oriented.  height is 5\' 3"  (1.6 m) and weight is  120 lb (54.4 kg). Her oral temperature is 98 F (36.7 C). Her blood pressure is 137/96 (abnormal) and her pulse is 87. Her respiration is 20 and oxygen saturation is 100%. .   Lungs are clear to auscultation bilaterally. Heart has regular rate and rhythm. No palpable cervical, supraclavicular, or axillary adenopathy. Abdomen soft, non-tender, normal bowel sounds. Right chest wall: patient continues to have some erythema along the surgical site. Skin is overall healed at this time. Left chest wall: no palpable or physical signs of cancer  Lab Findings: Lab Results  Component Value Date   WBC 3.1 (L) 07/25/2017   HGB 12.0 07/25/2017   HCT 35.0 07/25/2017   MCV 92.6 07/25/2017   PLT 233 07/25/2017    Radiographic Findings: No results found.  Impression:  The patient is recovering from the effects of radiation. Skin is essentially healed at this time. She will use neosporin over the area of concern/redness..  Plan:  Pt will follow up in 1 months. She will start letrozole in the next few weeks. Pt will go through survivorship in the next few weeks. The patient has decided not to pursue additional surgery at this time.   -----------------------------------  Blair Promise, PhD, MD  This document serves as a record of services personally performed by Gery Pray, MD. It was created on his behalf by Wilburn Mylar, a trained medical scribe. The creation of this record is based on the scribe's personal observations and the provider's statements  to them. This document has been checked and approved by the attending provider.

## 2017-10-23 ENCOUNTER — Ambulatory Visit: Payer: BLUE CROSS/BLUE SHIELD | Attending: Hematology and Oncology

## 2017-10-23 DIAGNOSIS — Z483 Aftercare following surgery for neoplasm: Secondary | ICD-10-CM | POA: Insufficient documentation

## 2017-10-23 DIAGNOSIS — R29898 Other symptoms and signs involving the musculoskeletal system: Secondary | ICD-10-CM | POA: Diagnosis present

## 2017-10-23 DIAGNOSIS — L599 Disorder of the skin and subcutaneous tissue related to radiation, unspecified: Secondary | ICD-10-CM | POA: Insufficient documentation

## 2017-10-23 DIAGNOSIS — M25611 Stiffness of right shoulder, not elsewhere classified: Secondary | ICD-10-CM | POA: Diagnosis not present

## 2017-10-23 DIAGNOSIS — R293 Abnormal posture: Secondary | ICD-10-CM | POA: Diagnosis present

## 2017-10-23 NOTE — Therapy (Signed)
[place], [place], [address] Phone: [phone]   Fax:  [phone]  Physical Therapy Treatment  Patient Details  Name: Angelica Floyd MRN: [id number] Date of Birth: 05-21-1965 Referring Provider: Dr. Nicholas Lose   Encounter Date: 10/23/2017  PT End of Session - 10/23/17 1023    Visit Number  9    Number of Visits  21    Date for PT Re-Evaluation  11/20/17    PT Start Time  0937    PT Stop Time  1019    PT Time Calculation (min)  42 min    Activity Tolerance  Patient tolerated treatment well    Behavior During Therapy  [facility] for tasks assessed/performed       Past Medical History:  Diagnosis Date  . Anxiety   . History of chemotherapy    finished chemo 07/29/2017  . 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, estrogen receptor positive ([place]) 01/08/2017  . Seasonal allergies     Past Surgical History:  Procedure Laterality Date  . ABDOMINAL HYSTERECTOMY     partial  . ADENOIDECTOMY W/ MYRINGOTOMY    . APPENDECTOMY    . AXILLARY LYMPH NODE DISSECTION Right 02/14/2017   Procedure: RIGHT AXILLARY LYMPH NODE DISSECTION ERAS PATHWAY;  Surgeon: Jovita Kussmaul, MD;  Location: [place];  Service: General;  Laterality: Right;  . CESAREAN SECTION    . MASTECTOMY W/ SENTINEL NODE BIOPSY Bilateral 01/28/2017   RIGHT BREAST BIOPSY  . MASTECTOMY W/ SENTINEL NODE BIOPSY Bilateral 01/28/2017   Procedure: BILATERAL MASTECTOMY WITH RIGHT SENTINEL LYMPH NODE BIOPSY;  Surgeon: Jovita Kussmaul, MD;  Location: [place];  Service: General;  Laterality: Bilateral;  . PORT-A-CATH REMOVAL Left 08/08/2017   Procedure: REMOVAL PORT-A-CATH;  Surgeon: Jovita Kussmaul, MD;  Location: [place];  Service: General;  Laterality: Left;  . PORTACATH PLACEMENT Left 02/14/2017   Procedure: INSERTION PORT-A-CATH;  Surgeon: Jovita Kussmaul, MD;  Location: [place];  Service: General;  Laterality: Left;   . RE-EXCISION OF BREAST CANCER,SUPERIOR MARGINS N/A 02/14/2017   Procedure: RE-EXCISION INFERIOR FLAP;  Surgeon: Jovita Kussmaul, MD;  Location: [place];  Service: General;  Laterality: N/A;  . WISDOM TOOTH EXTRACTION      There were no vitals filed for this visit.  Subjective Assessment - 10/23/17 0943    Subjective  I finished radiation 10/02/17 and ended up being really burnt and sore by the end, like the top layer just peeled off but no open sores. I had to stop my HEP stretching towards the end because it just hurt too much. I've started getting back into it over the past week because it took that long to start feel like I could move it again without the skin opening. I'm working on getting back into doing HEP daily but I am using my arm more now. I've noticed my Rt sholder is "crunching" again after having not moved it alot over past few weeks.     Pertinent History  Bilateral mastectomy for right hormone receptive breast cancer with right SLNB 01/28/17 followed by re-excision and ALND 02/14/17. Just finished chemo week of 07/22/17. Will start radiation in April. Otherwise healthy.    Patient Stated Goals  Get ahead of things before radiation and decrease cording    Currently in Pain?  No/denies         [facility] PT Assessment - 10/23/17 0001  AROM   Right Shoulder Flexion  142 Degrees    Right Shoulder ABduction  139 Degrees after stretching                   [facility] Adult PT Treatment/Exercise - 10/23/17 0001      Manual Therapy   Manual Therapy  Myofascial release;Passive ROM;Scapular mobilization    Soft tissue mobilization  --    Myofascial Release  To Rt UE with pulling into abduction, then to Rt axillary cording area but very gently due to irradiated tissue    Scapular Mobilization  Briefly in Lt S/L into retraction/protractin and depression with P/ROM of Rt UE. Pts scapula is well mobile so did not do this long    Passive ROM  In Supine: Rt shoulder to pts tolerance  into flexion, abduction, and er; and then into Lt S/L for flexion and abduction but pulling at skin felt with end ROM abduction so did not cont this               PT Short Term Goals - 07/22/17 1437      PT SHORT TERM GOAL #1   Title  Pt. will report perception of at least 30% decrease in right axillary cording.    Baseline  Maybe 30% better as of 07/22/17    Status  Achieved        PT Long Term Goals - 10/23/17 0940      PT LONG TERM GOAL #1   Title  Pt. will report perception of at least 60% decrease in right axillary cording.    Baseline  Pt reports 50% improvement thus far-08/13/17; pt still reports about 60-70% improved after finishing radiation-10/23/17    Status  Achieved      PT LONG TERM GOAL #2   Title  Pt. will be independent in HEP with stretches to minimize cording including right shoulder abduction.    Baseline  Pt doing this throughout day-08/13/17; pt has reported getting back into these slowly as she was very burnt from radiation-10/23/17    Status  On-going      PT LONG TERM GOAL #3   Title  right shoulder active flexion and abduction to at least 145 degrees each    Baseline  Rt shoulder flexion 151 and abduction 161 degrees-08/13/17; Rt flexion 136 (142 after stretching) and abduction 126 (139 after stretching) degrees-10/23/17    Status  On-going      Breast Clinic Goals - 01/09/17 1430      Patient will be able to verbalize understanding of pertinent lymphedema risk reduction practices relevant to her diagnosis specifically related to skin care.   Time  1    Period  Days    Status  Achieved      Patient will be able to return demonstrate and/or verbalize understanding of the post-op home exercise program related to regaining shoulder range of motion.   Time  1    Period  Days    Status  Achieved      Patient will be able to verbalize understanding of the importance of attending the postoperative After Breast Cancer Class for further lymphedema risk  reduction education and therapeutic exercise.   Time  1    Period  Days    Status  Achieved       Long Term Clinic Goals - 05/27/17 1226      CC Long Term Goal  #1   Title  Patient will be independent in  her home exercise program for improving shoulder ROM.    Baseline  Progressed HEP to include Strength ABC Program today-05/13/17; pt is now consistent with all HEP-05/27/17    Status  Achieved      CC Long Term Goal  #2   Title  Incease bilateral shoulder active flexion to >/= 130 degrees for increased ease reaching.    Baseline  Rt 110 degrees (cording very limiting) and Lt 133 degrees=04/02/17; Rt 138 and Lt 151 degrees-05/13/17; Rt 147 and Lt 155 degrees-05/27/17    Status  Achieved      CC Long Term Goal  #3   Title  Incease bilateral shoulder active abduction to >/= 130 degrees for increased ease reaching.    Baseline  Rt 103 degrees (cording very limiting) and Lt 134 degrees-04/02/17; Rt 140 and Lt 153 degrees-05/13/17; Rt 144 and Lt 166 degrees-05/27/17    Status  Achieved      CC Long Term Goal  #4   Title  Patient will report she has returned to running without increased edema in her right lateral trunk.    Baseline  Did run a half of the Thanksgiving [place] trot, but hasn't run since last chemo due to cold; Pt has gone running a few times up to 3 miles and reports this feeling good-05/13/17; Pt has been walk/running as she feels able now and reports no problems with this-05/27/17    Status  Achieved      CC Long Term Goal  #5   Title  Patient will verbalize understanding of lymphedema risk reduction practices.    Status  Achieved         Plan - 10/23/17 1024    Clinical Impression Statement  Pt returns today after having finished radiation on 10/02/17. No openingd on skin but still tender to touch per pt report and some pink areas that are most tender. Pt has had decrease of ROM since last visit and reports feeling tighter as she wasn't able to cont stretching the last few  weeks due to compromised tissue from radiation treatments. Pt will benefit from return to therapy to help her regain Rt sholder ROM (which was improved today after stretching) and decrease tissue tightness at Rt chest and axilla.     Rehab Potential  Excellent    Clinical Impairments Affecting Rehab Potential  finished chemo 07/25/17; due to start radiation 08/30/17    PT Frequency  2x / week    PT Duration  4 weeks    PT Treatment/Interventions  ADLs/Self Care Home Management;Moist Heat;Therapeutic exercise;Patient/family education;Manual techniques;Manual lymph drainage;Scar mobilization;Passive range of motion;Taping    PT Next Visit Plan  Renewal done today. Focus on regaining end ROM being mindful of irradiated tissue and decreasing tightness at Rt chest and axilla. Also encouraging pt to return fully to her HEP.    Consulted and Agree with Plan of Care  Patient       Patient will benefit from skilled therapeutic intervention in order to improve the following deficits and impairments:  Decreased range of motion, Pain, Increased fascial restricitons  Visit Diagnosis: Stiffness of right shoulder, not elsewhere classified  Aftercare following surgery for neoplasm  Other symptoms and signs involving the musculoskeletal system  Abnormal posture     Problem List Patient Active Problem List   Diagnosis Date Noted  . Cancer of central portion of right female breast ([place]) 01/28/2017  . 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, estrogen receptor positive ([place])  01/08/2017    Otelia Limes, PTA 10/23/2017, 11:20 AM  [place], [place], [address] Phone: [phone]   Fax:  [phone]  Name: FLORINE SPRENKLE MRN: [id number] Date of Birth: 05-06-1966

## 2017-10-29 ENCOUNTER — Ambulatory Visit: Payer: BLUE CROSS/BLUE SHIELD

## 2017-10-29 DIAGNOSIS — R29898 Other symptoms and signs involving the musculoskeletal system: Secondary | ICD-10-CM

## 2017-10-29 DIAGNOSIS — R293 Abnormal posture: Secondary | ICD-10-CM

## 2017-10-29 DIAGNOSIS — M25611 Stiffness of right shoulder, not elsewhere classified: Secondary | ICD-10-CM | POA: Diagnosis not present

## 2017-10-29 DIAGNOSIS — Z483 Aftercare following surgery for neoplasm: Secondary | ICD-10-CM

## 2017-10-29 NOTE — Therapy (Signed)
[place], [place], [address] Phone: [phone]   Fax:  [phone]  Physical Therapy Treatment  Patient Details  Name: Angelica Floyd MRN: [id number] Date of Birth: January 03, 1966 Referring Provider: Dr. Lindi Adie   Encounter Date: 10/29/2017  PT End of Session - 10/29/17 0932    Visit Number  10    Number of Visits  21    Date for PT Re-Evaluation  11/20/17    PT Start Time  0850    PT Stop Time  0931    PT Time Calculation (min)  41 min    Activity Tolerance  Patient tolerated treatment well    Behavior During Therapy  [facility] for tasks assessed/performed       Past Medical History:  Diagnosis Date  . Anxiety   . History of chemotherapy    finished chemo 07/29/2017  . 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, estrogen receptor positive ([place]) 01/08/2017  . Seasonal allergies     Past Surgical History:  Procedure Laterality Date  . ABDOMINAL HYSTERECTOMY     partial  . ADENOIDECTOMY W/ MYRINGOTOMY    . APPENDECTOMY    . AXILLARY LYMPH NODE DISSECTION Right 02/14/2017   Procedure: RIGHT AXILLARY LYMPH NODE DISSECTION ERAS PATHWAY;  Surgeon: Jovita Kussmaul, MD;  Location: [place];  Service: General;  Laterality: Right;  . CESAREAN SECTION    . MASTECTOMY W/ SENTINEL NODE BIOPSY Bilateral 01/28/2017   RIGHT BREAST BIOPSY  . MASTECTOMY W/ SENTINEL NODE BIOPSY Bilateral 01/28/2017   Procedure: BILATERAL MASTECTOMY WITH RIGHT SENTINEL LYMPH NODE BIOPSY;  Surgeon: Jovita Kussmaul, MD;  Location: [place];  Service: General;  Laterality: Bilateral;  . PORT-A-CATH REMOVAL Left 08/08/2017   Procedure: REMOVAL PORT-A-CATH;  Surgeon: Jovita Kussmaul, MD;  Location: [place];  Service: General;  Laterality: Left;  . PORTACATH PLACEMENT Left 02/14/2017   Procedure: INSERTION PORT-A-CATH;  Surgeon: Jovita Kussmaul, MD;  Location: [place];  Service: General;  Laterality: Left;  .  RE-EXCISION OF BREAST CANCER,SUPERIOR MARGINS N/A 02/14/2017   Procedure: RE-EXCISION INFERIOR FLAP;  Surgeon: Jovita Kussmaul, MD;  Location: [place];  Service: General;  Laterality: N/A;  . WISDOM TOOTH EXTRACTION      There were no vitals filed for this visit.  Subjective Assessment - 10/29/17 0850    Subjective  I felt good after my last session. I could tell we stretched good and my skin did okay. I started Letrozole Saturday.     Pertinent History  Bilateral mastectomy for right hormone receptive breast cancer with right SLNB 01/28/17 followed by re-excision and ALND 02/14/17. Just finished chemo week of 07/22/17. Will start radiation in April. Otherwise healthy.    Patient Stated Goals  Get ahead of things before radiation and decrease cording    Currently in Pain?  No/denies                       [facility] Adult PT Treatment/Exercise - 10/29/17 0001      Shoulder Exercises: Pulleys   Flexion  2 minutes    Flexion Limitations  VCs to remind pt to relax shoulders    ABduction  2 minutes      Shoulder Exercises: Therapy Ball   Flexion  Both;10 reps Forward lean into end of stretch    ABduction  Right;Left;10 reps Same side lean into end of stretch  Manual Therapy   Myofascial Release  To Rt UE with pulling into abduction, then to Rt axillary cording area but very gently due to irradiated tissue    Scapular Mobilization  --    Passive ROM  In Supine: Rt shoulder to pts tolerance into flexion and abduction with slow prolonged holds at end ROM and to pts tolerance               PT Short Term Goals - 07/22/17 1437      PT SHORT TERM GOAL #1   Title  Pt. will report perception of at least 30% decrease in right axillary cording.    Baseline  Maybe 30% better as of 07/22/17    Status  Achieved        PT Long Term Goals - 10/23/17 0940      PT LONG TERM GOAL #1   Title  Pt. will report perception of at least 60% decrease in right axillary cording.     Baseline  Pt reports 50% improvement thus far-08/13/17; pt still reports about 60-70% improved after finishing radiation-10/23/17    Status  Achieved      PT LONG TERM GOAL #2   Title  Pt. will be independent in HEP with stretches to minimize cording including right shoulder abduction.    Baseline  Pt doing this throughout day-08/13/17; pt has reported getting back into these slowly as she was very burnt from radiation-10/23/17    Status  On-going      PT LONG TERM GOAL #3   Title  right shoulder active flexion and abduction to at least 145 degrees each    Baseline  Rt shoulder flexion 151 and abduction 161 degrees-08/13/17; Rt flexion 136 (142 after stretching) and abduction 126 (139 after stretching) degrees-10/23/17    Status  On-going      Breast Clinic Goals - 01/09/17 1430      Patient will be able to verbalize understanding of pertinent lymphedema risk reduction practices relevant to her diagnosis specifically related to skin care.   Time  1    Period  Days    Status  Achieved      Patient will be able to return demonstrate and/or verbalize understanding of the post-op home exercise program related to regaining shoulder range of motion.   Time  1    Period  Days    Status  Achieved      Patient will be able to verbalize understanding of the importance of attending the postoperative After Breast Cancer Class for further lymphedema risk reduction education and therapeutic exercise.   Time  1    Period  Days    Status  Achieved       Long Term Clinic Goals - 05/27/17 1226      CC Long Term Goal  #1   Title  Patient will be independent in her home exercise program for improving shoulder ROM.    Baseline  Progressed HEP to include Strength ABC Program today-05/13/17; pt is now consistent with all HEP-05/27/17    Status  Achieved      CC Long Term Goal  #2   Title  Incease bilateral shoulder active flexion to >/= 130 degrees for increased ease reaching.    Baseline  Rt 110 degrees  (cording very limiting) and Lt 133 degrees=04/02/17; Rt 138 and Lt 151 degrees-05/13/17; Rt 147 and Lt 155 degrees-05/27/17    Status  Achieved      CC Long Term Goal  #  3   Title  Incease bilateral shoulder active abduction to >/= 130 degrees for increased ease reaching.    Baseline  Rt 103 degrees (cording very limiting) and Lt 134 degrees-04/02/17; Rt 140 and Lt 153 degrees-05/13/17; Rt 144 and Lt 166 degrees-05/27/17    Status  Achieved      CC Long Term Goal  #4   Title  Patient will report she has returned to running without increased edema in her right lateral trunk.    Baseline  Did run a half of the Thanksgiving [place] trot, but hasn't run since last chemo due to cold; Pt has gone running a few times up to 3 miles and reports this feeling good-05/13/17; Pt has been walk/running as she feels able now and reports no problems with this-05/27/17    Status  Achieved      CC Long Term Goal  #5   Title  Patient will verbalize understanding of lymphedema risk reduction practices.    Status  Achieved         Plan - 10/29/17 0932    Clinical Impression Statement  Resumed AA/ROM to bil shoulders with pulleys and ball roll up wall. Then continued manual therapy focusing on end ROM but being mindful of irradiated tissue throughout session. Pt reports feeling much looser at end of session.    Rehab Potential  Excellent    Clinical Impairments Affecting Rehab Potential  finished chemo 07/25/17; radiation from 08/30/17-10/02/17    PT Frequency  2x / week    PT Duration  4 weeks    PT Treatment/Interventions  ADLs/Self Care Home Management;Moist Heat;Therapeutic exercise;Patient/family education;Manual techniques;Manual lymph drainage;Scar mobilization;Passive range of motion;Taping    PT Next Visit Plan  Focus on regaining end ROM being mindful of irradiated tissue and decreasing tightness at Rt chest and axilla. Also encouraging pt to return fully to her HEP.    Consulted and Agree with Plan of Care   Patient       Patient will benefit from skilled therapeutic intervention in order to improve the following deficits and impairments:  Decreased range of motion, Pain, Increased fascial restricitons  Visit Diagnosis: Stiffness of right shoulder, not elsewhere classified  Aftercare following surgery for neoplasm  Other symptoms and signs involving the musculoskeletal system  Abnormal posture     Problem List Patient Active Problem List   Diagnosis Date Noted  . Cancer of central portion of right female breast ([place]) 01/28/2017  . 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, estrogen receptor positive ([place]) 01/08/2017    Otelia Limes, PTA 10/29/2017, 9:35 AM  [place], [place], [address] Phone: [phone]   Fax:  [phone]  Name: Angelica Floyd MRN: [id number] Date of Birth: 08/27/1965

## 2017-10-31 ENCOUNTER — Encounter: Payer: Self-pay | Admitting: Physical Therapy

## 2017-10-31 ENCOUNTER — Ambulatory Visit: Payer: BLUE CROSS/BLUE SHIELD | Admitting: Physical Therapy

## 2017-10-31 DIAGNOSIS — L599 Disorder of the skin and subcutaneous tissue related to radiation, unspecified: Secondary | ICD-10-CM

## 2017-10-31 DIAGNOSIS — R293 Abnormal posture: Secondary | ICD-10-CM

## 2017-10-31 DIAGNOSIS — M25611 Stiffness of right shoulder, not elsewhere classified: Secondary | ICD-10-CM | POA: Diagnosis not present

## 2017-10-31 DIAGNOSIS — Z483 Aftercare following surgery for neoplasm: Secondary | ICD-10-CM

## 2017-10-31 DIAGNOSIS — R29898 Other symptoms and signs involving the musculoskeletal system: Secondary | ICD-10-CM

## 2017-10-31 NOTE — Therapy (Signed)
[place], [place], [address] Phone: [phone]   Fax:  [phone]  Physical Therapy Treatment  Patient Details  Name: Angelica Floyd MRN: [id number] Date of Birth: 05-20-1965 Referring Provider: Dr. Lindi Adie   Encounter Date: 10/31/2017  PT End of Session - 10/31/17 1641    Visit Number  11    Number of Visits  21    Date for PT Re-Evaluation  11/20/17    PT Start Time  1520    PT Stop Time  1600    PT Time Calculation (min)  40 min    Activity Tolerance  Patient tolerated treatment well    Behavior During Therapy  [facility] for tasks assessed/performed       Past Medical History:  Diagnosis Date  . Anxiety   . History of chemotherapy    finished chemo 07/29/2017  . 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, estrogen receptor positive ([place]) 01/08/2017  . Seasonal allergies     Past Surgical History:  Procedure Laterality Date  . ABDOMINAL HYSTERECTOMY     partial  . ADENOIDECTOMY W/ MYRINGOTOMY    . APPENDECTOMY    . AXILLARY LYMPH NODE DISSECTION Right 02/14/2017   Procedure: RIGHT AXILLARY LYMPH NODE DISSECTION ERAS PATHWAY;  Surgeon: Jovita Kussmaul, MD;  Location: [place];  Service: General;  Laterality: Right;  . CESAREAN SECTION    . MASTECTOMY W/ SENTINEL NODE BIOPSY Bilateral 01/28/2017   RIGHT BREAST BIOPSY  . MASTECTOMY W/ SENTINEL NODE BIOPSY Bilateral 01/28/2017   Procedure: BILATERAL MASTECTOMY WITH RIGHT SENTINEL LYMPH NODE BIOPSY;  Surgeon: Jovita Kussmaul, MD;  Location: [place];  Service: General;  Laterality: Bilateral;  . PORT-A-CATH REMOVAL Left 08/08/2017   Procedure: REMOVAL PORT-A-CATH;  Surgeon: Jovita Kussmaul, MD;  Location: [place];  Service: General;  Laterality: Left;  . PORTACATH PLACEMENT Left 02/14/2017   Procedure: INSERTION PORT-A-CATH;  Surgeon: Jovita Kussmaul, MD;  Location: [place];  Service: General;  Laterality: Left;  .  RE-EXCISION OF BREAST CANCER,SUPERIOR MARGINS N/A 02/14/2017   Procedure: RE-EXCISION INFERIOR FLAP;  Surgeon: Jovita Kussmaul, MD;  Location: [place];  Service: General;  Laterality: N/A;  . WISDOM TOOTH EXTRACTION      There were no vitals filed for this visit.  Subjective Assessment - 10/31/17 1635    Subjective  Pt states she is feels like some areas in her right chest feel like they are bruised.  she still has tightness in her cording in her axilla     Pertinent History  Bilateral mastectomy for right hormone receptive breast cancer with right SLNB 01/28/17 followed by re-excision and ALND 02/14/17. Finished chemo week of 07/22/17.  Completed chemo 10/03/2017     Patient Stated Goals  to get her shoulder range of motion back     Currently in Pain?  No/denies                       [facility] Adult PT Treatment/Exercise - 10/31/17 0001      Self-Care   Self-Care  Other Self-Care Comments    Other Self-Care Comments   provided patch of peach foam on white foam for pt to try inside sports bra at full area at lateral incision       Shoulder Exercises: Supine   Other Supine Exercises  supine with purple squishy ball at thoracic spine and pillow under head for stretching  of chest opening.       Shoulder Exercises: Sidelying   ABduction  AROM;Right;5 reps very tight in axilla     Other Sidelying Exercises  small circles with hand pointed to ceiling     Other Sidelying Exercises  horizontal abduction with posterio thoraic rotation       Manual Therapy   Soft tissue mobilization  with massage cream gently to tight pec major muscle in supine and to posterior shoulder in sidelying                PT Short Term Goals - 07/22/17 1437      PT SHORT TERM GOAL #1   Title  Pt. will report perception of at least 30% decrease in right axillary cording.    Baseline  Maybe 30% better as of 07/22/17    Status  Achieved        PT Long Term Goals - 10/23/17 0940      PT LONG TERM  GOAL #1   Title  Pt. will report perception of at least 60% decrease in right axillary cording.    Baseline  Pt reports 50% improvement thus far-08/13/17; pt still reports about 60-70% improved after finishing radiation-10/23/17    Status  Achieved      PT LONG TERM GOAL #2   Title  Pt. will be independent in HEP with stretches to minimize cording including right shoulder abduction.    Baseline  Pt doing this throughout day-08/13/17; pt has reported getting back into these slowly as she was very burnt from radiation-10/23/17    Status  On-going      PT LONG TERM GOAL #3   Title  right shoulder active flexion and abduction to at least 145 degrees each    Baseline  Rt shoulder flexion 151 and abduction 161 degrees-08/13/17; Rt flexion 136 (142 after stretching) and abduction 126 (139 after stretching) degrees-10/23/17    Status  On-going      Breast Clinic Goals - 01/09/17 1430      Patient will be able to verbalize understanding of pertinent lymphedema risk reduction practices relevant to her diagnosis specifically related to skin care.   Time  1    Period  Days    Status  Achieved      Patient will be able to return demonstrate and/or verbalize understanding of the post-op home exercise program related to regaining shoulder range of motion.   Time  1    Period  Days    Status  Achieved      Patient will be able to verbalize understanding of the importance of attending the postoperative After Breast Cancer Class for further lymphedema risk reduction education and therapeutic exercise.   Time  1    Period  Days    Status  Achieved       Long Term Clinic Goals - 05/27/17 1226      CC Long Term Goal  #1   Title  Patient will be independent in her home exercise program for improving shoulder ROM.    Baseline  Progressed HEP to include Strength ABC Program today-05/13/17; pt is now consistent with all HEP-05/27/17    Status  Achieved      CC Long Term Goal  #2   Title  Incease bilateral  shoulder active flexion to >/= 130 degrees for increased ease reaching.    Baseline  Rt 110 degrees (cording very limiting) and Lt 133 degrees=04/02/17; Rt 138 and Lt 151 degrees-05/13/17;  Rt 147 and Lt 155 degrees-05/27/17    Status  Achieved      CC Long Term Goal  #3   Title  Incease bilateral shoulder active abduction to >/= 130 degrees for increased ease reaching.    Baseline  Rt 103 degrees (cording very limiting) and Lt 134 degrees-04/02/17; Rt 140 and Lt 153 degrees-05/13/17; Rt 144 and Lt 166 degrees-05/27/17    Status  Achieved      CC Long Term Goal  #4   Title  Patient will report she has returned to running without increased edema in her right lateral trunk.    Baseline  Did run a half of the Thanksgiving [place] trot, but hasn't run since last chemo due to cold; Pt has gone running a few times up to 3 miles and reports this feeling good-05/13/17; Pt has been walk/running as she feels able now and reports no problems with this-05/27/17    Status  Achieved      CC Long Term Goal  #5   Title  Patient will verbalize understanding of lymphedema risk reduction practices.    Status  Achieved         Plan - 10/31/17 1641    Clinical Impression Statement  Pt had reduction of tightness in right pec major area with massage cream soft tissue work  She said she felt loosening of shoulder after treatment     Clinical Impairments Affecting Rehab Potential  finished chemo 07/25/17; radiation from 08/30/17-10/02/17    PT Frequency  2x / week    PT Duration  4 weeks    PT Treatment/Interventions  ADLs/Self Care Home Management;Moist Heat;Therapeutic exercise;Patient/family education;Manual techniques;Manual lymph drainage;Scar mobilization;Passive range of motion;Taping    PT Next Visit Plan  Focus on regaining end ROM being mindful of irradiated tissue and decreasing tightness at Rt chest and axilla. Continue with soft tissue work with massage cream Also encouraging pt to return fully to her HEP.        Patient will benefit from skilled therapeutic intervention in order to improve the following deficits and impairments:  Decreased range of motion, Pain, Increased fascial restricitons  Visit Diagnosis: Stiffness of right shoulder, not elsewhere classified  Other symptoms and signs involving the musculoskeletal system  Abnormal posture  Aftercare following surgery for neoplasm  Disorder of the skin and subcutaneous tissue related to radiation, unspecified     Problem List Patient Active Problem List   Diagnosis Date Noted  . Cancer of central portion of right female breast ([place]) 01/28/2017  . 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, estrogen receptor positive ([place]) 01/08/2017   Donato Heinz. Owens Shark PT   Norwood Levo 10/31/2017, 4:46 PM  [place], [place], [address] Phone: [phone]   Fax:  [phone]  Name: Angelica Floyd MRN: [id number] Date of Birth: 1965-10-12

## 2017-11-05 ENCOUNTER — Ambulatory Visit: Payer: BLUE CROSS/BLUE SHIELD

## 2017-11-05 DIAGNOSIS — R293 Abnormal posture: Secondary | ICD-10-CM

## 2017-11-05 DIAGNOSIS — Z483 Aftercare following surgery for neoplasm: Secondary | ICD-10-CM

## 2017-11-05 DIAGNOSIS — M25611 Stiffness of right shoulder, not elsewhere classified: Secondary | ICD-10-CM

## 2017-11-05 DIAGNOSIS — R29898 Other symptoms and signs involving the musculoskeletal system: Secondary | ICD-10-CM

## 2017-11-05 DIAGNOSIS — L599 Disorder of the skin and subcutaneous tissue related to radiation, unspecified: Secondary | ICD-10-CM

## 2017-11-05 NOTE — Therapy (Signed)
[place], [place], [address] Phone: [phone]   Fax:  [phone]  Physical Therapy Treatment  Patient Details  Name: Angelica Floyd MRN: [id number] Date of Birth: 10/20/1965 Referring Provider: Dr. Lindi Adie   Encounter Date: 11/05/2017  PT End of Session - 11/05/17 0932    Visit Number  12    Number of Visits  21    Date for PT Re-Evaluation  11/20/17    PT Start Time  0845    PT Stop Time  0931    PT Time Calculation (min)  46 min    Activity Tolerance  Patient tolerated treatment well    Behavior During Therapy  [facility] for tasks assessed/performed       Past Medical History:  Diagnosis Date  . Anxiety   . History of chemotherapy    finished chemo 07/29/2017  . 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, estrogen receptor positive ([place]) 01/08/2017  . Seasonal allergies     Past Surgical History:  Procedure Laterality Date  . ABDOMINAL HYSTERECTOMY     partial  . ADENOIDECTOMY W/ MYRINGOTOMY    . APPENDECTOMY    . AXILLARY LYMPH NODE DISSECTION Right 02/14/2017   Procedure: RIGHT AXILLARY LYMPH NODE DISSECTION ERAS PATHWAY;  Surgeon: Jovita Kussmaul, MD;  Location: [facility];  Service: General;  Laterality: Right;  . CESAREAN SECTION    . MASTECTOMY W/ SENTINEL NODE BIOPSY Bilateral 01/28/2017   RIGHT BREAST BIOPSY  . MASTECTOMY W/ SENTINEL NODE BIOPSY Bilateral 01/28/2017   Procedure: BILATERAL MASTECTOMY WITH RIGHT SENTINEL LYMPH NODE BIOPSY;  Surgeon: Jovita Kussmaul, MD;  Location: [facility];  Service: General;  Laterality: Bilateral;  . PORT-A-CATH REMOVAL Left 08/08/2017   Procedure: REMOVAL PORT-A-CATH;  Surgeon: Jovita Kussmaul, MD;  Location: [facility];  Service: General;  Laterality: Left;  . PORTACATH PLACEMENT Left 02/14/2017   Procedure: INSERTION PORT-A-CATH;  Surgeon: Jovita Kussmaul, MD;  Location: [facility];  Service: General;  Laterality: Left;  .  RE-EXCISION OF BREAST CANCER,SUPERIOR MARGINS N/A 02/14/2017   Procedure: RE-EXCISION INFERIOR FLAP;  Surgeon: Jovita Kussmaul, MD;  Location: [facility];  Service: General;  Laterality: N/A;  . WISDOM TOOTH EXTRACTION      There were no vitals filed for this visit.  Subjective Assessment - 11/05/17 0850    Subjective  [name] really worked on the front of my shoulder last time and I hadn't realized how tight it was in there! I did a pilates class for tthe first time yesterday and that was really good. I'm going to do a few more.     Pertinent History  Bilateral mastectomy for right hormone receptive breast cancer with right SLNB 01/28/17 followed by re-excision and ALND 02/14/17. Finished chemo week of 07/22/17.  Completed chemo 10/03/2017     Patient Stated Goals  to get her shoulder range of motion back     Currently in Pain?  No/denies                       [facility] Adult PT Treatment/Exercise - 11/05/17 0001      Manual Therapy   Soft tissue mobilization  with massage cream gently to tight pec major muscle in supine and to posterior shoulder in sidelying     Passive ROM  In Supine: Rt shoulder to pts tolerance into flexion and abduction with slow prolonged holds at end ROM and  to pts tolerance               PT Short Term Goals - 07/22/17 1437      PT SHORT TERM GOAL #1   Title  Pt. will report perception of at least 30% decrease in right axillary cording.    Baseline  Maybe 30% better as of 07/22/17    Status  Achieved        PT Long Term Goals - 10/23/17 0940      PT LONG TERM GOAL #1   Title  Pt. will report perception of at least 60% decrease in right axillary cording.    Baseline  Pt reports 50% improvement thus far-08/13/17; pt still reports about 60-70% improved after finishing radiation-10/23/17    Status  Achieved      PT LONG TERM GOAL #2   Title  Pt. will be independent in HEP with stretches to minimize cording including right shoulder abduction.     Baseline  Pt doing this throughout day-08/13/17; pt has reported getting back into these slowly as she was very burnt from radiation-10/23/17    Status  On-going      PT LONG TERM GOAL #3   Title  right shoulder active flexion and abduction to at least 145 degrees each    Baseline  Rt shoulder flexion 151 and abduction 161 degrees-08/13/17; Rt flexion 136 (142 after stretching) and abduction 126 (139 after stretching) degrees-10/23/17    Status  On-going      Breast Clinic Goals - 01/09/17 1430      Patient will be able to verbalize understanding of pertinent lymphedema risk reduction practices relevant to her diagnosis specifically related to skin care.   Time  1    Period  Days    Status  Achieved      Patient will be able to return demonstrate and/or verbalize understanding of the post-op home exercise program related to regaining shoulder range of motion.   Time  1    Period  Days    Status  Achieved      Patient will be able to verbalize understanding of the importance of attending the postoperative After Breast Cancer Class for further lymphedema risk reduction education and therapeutic exercise.   Time  1    Period  Days    Status  Achieved       Long Term Clinic Goals - 05/27/17 1226      CC Long Term Goal  #1   Title  Patient will be independent in her home exercise program for improving shoulder ROM.    Baseline  Progressed HEP to include Strength ABC Program today-05/13/17; pt is now consistent with all HEP-05/27/17    Status  Achieved      CC Long Term Goal  #2   Title  Incease bilateral shoulder active flexion to >/= 130 degrees for increased ease reaching.    Baseline  Rt 110 degrees (cording very limiting) and Lt 133 degrees=04/02/17; Rt 138 and Lt 151 degrees-05/13/17; Rt 147 and Lt 155 degrees-05/27/17    Status  Achieved      CC Long Term Goal  #3   Title  Incease bilateral shoulder active abduction to >/= 130 degrees for increased ease reaching.    Baseline  Rt  103 degrees (cording very limiting) and Lt 134 degrees-04/02/17; Rt 140 and Lt 153 degrees-05/13/17; Rt 144 and Lt 166 degrees-05/27/17    Status  Achieved      CC Long  Term Goal  #4   Title  Patient will report she has returned to running without increased edema in her right lateral trunk.    Baseline  Did run a half of the Thanksgiving [place] trot, but hasn't run since last chemo due to cold; Pt has gone running a few times up to 3 miles and reports this feeling good-05/13/17; Pt has been walk/running as she feels able now and reports no problems with this-05/27/17    Status  Achieved      CC Long Term Goal  #5   Title  Patient will verbalize understanding of lymphedema risk reduction practices.    Status  Achieved         Plan - 11/05/17 0932    Clinical Impression Statement  Continued with manual therapy focusing on tightness at anterior joint capsule with biotone, and then briefly to posterior shoulder. Also included P/ROM throughout session. Pt has one more visit on Friday and then will be out of town for a few weeks for vacation. She is considering D/C or cont after return. To discuss further with PT at that visit.     Rehab Potential  Excellent    Clinical Impairments Affecting Rehab Potential  finished chemo 07/25/17; radiation from 08/30/17-10/02/17    PT Frequency  2x / week    PT Duration  4 weeks    PT Treatment/Interventions  ADLs/Self Care Home Management;Moist Heat;Therapeutic exercise;Patient/family education;Manual techniques;Manual lymph drainage;Scar mobilization;Passive range of motion;Taping    PT Next Visit Plan  Pt considering D/C or on hold until she returns from vacation. Focus on regaining end ROM being mindful of irradiated tissue and decreasing tightness at Rt chest and axilla. Continue with soft tissue work with massage cream Also encouraging pt to return fully to her HEP.    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 Cancer of central portion of right female breast ([place]) 01/28/2017  . 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, estrogen receptor positive ([place]) 01/08/2017    Otelia Limes, PTA 11/05/2017, 9:35 AM  [place], [place], [address] Phone: [phone]   Fax:  [phone]  Name: Angelica Floyd MRN: [id number] Date of Birth: [birth year]

## 2017-11-08 ENCOUNTER — Ambulatory Visit: Payer: BLUE CROSS/BLUE SHIELD | Admitting: Physical Therapy

## 2017-11-08 ENCOUNTER — Encounter (HOSPITAL_COMMUNITY): Payer: Self-pay

## 2017-11-08 ENCOUNTER — Ambulatory Visit (HOSPITAL_COMMUNITY)
Admission: EM | Admit: 2017-11-08 | Discharge: 2017-11-08 | Disposition: A | Discharge status: Home / Self Care | Payer: BLUE CROSS/BLUE SHIELD | Attending: Family Medicine | Admitting: Family Medicine

## 2017-11-08 DIAGNOSIS — R29898 Other symptoms and signs involving the musculoskeletal system: Secondary | ICD-10-CM

## 2017-11-08 DIAGNOSIS — R222 Localized swelling, mass and lump, trunk: Secondary | ICD-10-CM

## 2017-11-08 DIAGNOSIS — M25611 Stiffness of right shoulder, not elsewhere classified: Secondary | ICD-10-CM

## 2017-11-08 DIAGNOSIS — L599 Disorder of the skin and subcutaneous tissue related to radiation, unspecified: Secondary | ICD-10-CM

## 2017-11-08 NOTE — Discharge Instructions (Addendum)
Location, size, and texture of this nodule make this almost certainly a scar tissue from your Port-A-Cath.  Nevertheless, having had your experience and knowing that physical exam is not completely reliable, you need to make an appointment with your surgeon as soon as you get back from vacation to have this evaluated further.  This is not a clot and will not cause any sudden death.

## 2017-11-08 NOTE — ED Triage Notes (Signed)
Pt presents with lump on left chest area.

## 2017-11-08 NOTE — Therapy (Signed)
[place], [place], [address] Phone: [phone]   Fax:  [phone]  Physical Therapy Treatment  Patient Details  Name: Angelica Floyd MRN: [id number] Date of Birth: [birth year] Referring Provider: Dr. Lindi Adie   Encounter Date: 11/08/2017  PT End of Session - 11/08/17 1009    Visit Number  13    Number of Visits  21    Date for PT Re-Evaluation  11/20/17    PT Start Time  0850    PT Stop Time  0933    PT Time Calculation (min)  43 min    Activity Tolerance  Patient tolerated treatment well    Behavior During Therapy  [facility] for tasks assessed/performed       Past Medical History:  Diagnosis Date  . Anxiety   . History of chemotherapy    finished chemo 07/29/2017  . 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, estrogen receptor positive ([place]) 01/08/2017  . Seasonal allergies     Past Surgical History:  Procedure Laterality Date  . ABDOMINAL HYSTERECTOMY     partial  . ADENOIDECTOMY W/ MYRINGOTOMY    . APPENDECTOMY    . AXILLARY LYMPH NODE DISSECTION Right 02/14/2017   Procedure: RIGHT AXILLARY LYMPH NODE DISSECTION ERAS PATHWAY;  Surgeon: Jovita Kussmaul, MD;  Location: [facility];  Service: General;  Laterality: Right;  . CESAREAN SECTION    . MASTECTOMY W/ SENTINEL NODE BIOPSY Bilateral 01/28/2017   RIGHT BREAST BIOPSY  . MASTECTOMY W/ SENTINEL NODE BIOPSY Bilateral 01/28/2017   Procedure: BILATERAL MASTECTOMY WITH RIGHT SENTINEL LYMPH NODE BIOPSY;  Surgeon: Jovita Kussmaul, MD;  Location: [facility];  Service: General;  Laterality: Bilateral;  . PORT-A-CATH REMOVAL Left 08/08/2017   Procedure: REMOVAL PORT-A-CATH;  Surgeon: Jovita Kussmaul, MD;  Location: [facility];  Service: General;  Laterality: Left;  . PORTACATH PLACEMENT Left 02/14/2017   Procedure: INSERTION PORT-A-CATH;  Surgeon: Jovita Kussmaul, MD;  Location: [place];  Service: General;  Laterality: Left;  .  RE-EXCISION OF BREAST CANCER,SUPERIOR MARGINS N/A 02/14/2017   Procedure: RE-EXCISION INFERIOR FLAP;  Surgeon: Jovita Kussmaul, MD;  Location: [facility];  Service: General;  Laterality: N/A;  . WISDOM TOOTH EXTRACTION      There were no vitals filed for this visit.  Subjective Assessment - 11/08/17 0852    Subjective  I feel like this is loosening up--what I'm coming for.  And I felt this little spot (inferior to Portacath site) that feels like a little bb.    Pertinent History  Bilateral mastectomy for right hormone receptive breast cancer with right SLNB 01/28/17 followed by re-excision and ALND 02/14/17. Finished chemo week of 07/22/17.  Completed chemo 10/03/2017     Patient Stated Goals  to get her shoulder range of motion back     Currently in Pain?  No/denies         [facility] PT Assessment - 11/08/17 0001      Palpation   Palpation comment  Small nodule that patient felt just inferior to her Port site is easily palpable to this therapist. Advised patient to get it looked at by a doctor today, before she goes on vacation.                   [facility] Adult PT Treatment/Exercise - 11/08/17 0001      Shoulder Exercises: Supine   Other Supine Exercises  supine over towel roll, isometric  shoulder extension with scapular retraction, 5 counts x 5 x 2    Other Supine Exercises  supine over towel roll, active D2 bilat. x 10 for right shoulder stretch      Manual Therapy   Soft tissue mobilization  supine with right arm into horizontal abduction, soft tissue mobilization at right axilla    Myofascial Release  crosshands across right axilla and chest, but limited by discomfort of the pressure at right upper arm.  Rt. UE myofascial pulling with some concurrent distraction at right axilla, but discomfort with hand grip on arm for this one. In supine, crosshands from right upper arm to mid-chest. At right edge of mat, right arm in horizontal abduction and one hand supporting/distracting upper arm  with other hand releasing at right chest.    Passive ROM  In supine for Rt. shoulder er, abduction and flexion. In supine over towel roll, prolonged pect minor stretches to tolerance.               PT Short Term Goals - 07/22/17 1437      PT SHORT TERM GOAL #1   Title  Pt. will report perception of at least 30% decrease in right axillary cording.    Baseline  Maybe 30% better as of 07/22/17    Status  Achieved        PT Long Term Goals - 10/23/17 0940      PT LONG TERM GOAL #1   Title  Pt. will report perception of at least 60% decrease in right axillary cording.    Baseline  Pt reports 50% improvement thus far-08/13/17; pt still reports about 60-70% improved after finishing radiation-10/23/17    Status  Achieved      PT LONG TERM GOAL #2   Title  Pt. will be independent in HEP with stretches to minimize cording including right shoulder abduction.    Baseline  Pt doing this throughout day-08/13/17; pt has reported getting back into these slowly as she was very burnt from radiation-10/23/17    Status  On-going      PT LONG TERM GOAL #3   Title  right shoulder active flexion and abduction to at least 145 degrees each    Baseline  Rt shoulder flexion 151 and abduction 161 degrees-08/13/17; Rt flexion 136 (142 after stretching) and abduction 126 (139 after stretching) degrees-10/23/17    Status  On-going      Breast Clinic Goals - 01/09/17 1430      Patient will be able to verbalize understanding of pertinent lymphedema risk reduction practices relevant to her diagnosis specifically related to skin care.   Time  1    Period  Days    Status  Achieved      Patient will be able to return demonstrate and/or verbalize understanding of the post-op home exercise program related to regaining shoulder range of motion.   Time  1    Period  Days    Status  Achieved      Patient will be able to verbalize understanding of the importance of attending the postoperative After Breast Cancer  Class for further lymphedema risk reduction education and therapeutic exercise.   Time  1    Period  Days    Status  Achieved       Long Term Clinic Goals - 05/27/17 1226      CC Long Term Goal  #1   Title  Patient will be independent in her home exercise program for improving  shoulder ROM.    Baseline  Progressed HEP to include Strength ABC Program today-05/13/17; pt is now consistent with all HEP-05/27/17    Status  Achieved      CC Long Term Goal  #2   Title  Incease bilateral shoulder active flexion to >/= 130 degrees for increased ease reaching.    Baseline  Rt 110 degrees (cording very limiting) and Lt 133 degrees=04/02/17; Rt 138 and Lt 151 degrees-05/13/17; Rt 147 and Lt 155 degrees-05/27/17    Status  Achieved      CC Long Term Goal  #3   Title  Incease bilateral shoulder active abduction to >/= 130 degrees for increased ease reaching.    Baseline  Rt 103 degrees (cording very limiting) and Lt 134 degrees-04/02/17; Rt 140 and Lt 153 degrees-05/13/17; Rt 144 and Lt 166 degrees-05/27/17    Status  Achieved      CC Long Term Goal  #4   Title  Patient will report she has returned to running without increased edema in her right lateral trunk.    Baseline  Did run a half of the Thanksgiving [place] trot, but hasn't run since last chemo due to cold; Pt has gone running a few times up to 3 miles and reports this feeling good-05/13/17; Pt has been walk/running as she feels able now and reports no problems with this-05/27/17    Status  Achieved      CC Long Term Goal  #5   Title  Patient will verbalize understanding of lymphedema risk reduction practices.    Status  Achieved         Plan - 11/08/17 1009    Clinical Impression Statement  Continued manual work including PROM, myofascial release, and soft tissue work. Pt. felt like the last two sessions had helped to release right pect tightness, so now she felt stretch down into her chest and not just at anterior shoulder. She did  report feeling good, feeling looser at end of session. She will be on vacation for the next two weeks and was encouraged to do her stretching during that time.    Rehab Potential  Excellent    Clinical Impairments Affecting Rehab Potential  finished chemo 07/25/17; radiation from 08/30/17-10/02/17    PT Frequency  2x / week    PT Duration  4 weeks    PT Treatment/Interventions  ADLs/Self Care Home Management;Moist Heat;Therapeutic exercise;Patient/family education;Manual techniques;Manual lymph drainage;Scar mobilization;Passive range of motion;Taping    PT Next Visit Plan  Pt. to be on vacation the next two weeks. If she returns afterwards, will need renewal. Focus on regaining end ROM being mindful of irradiated tissue and work on decreasing tightness at Rt chest and axilla.     PT Home Exercise Plan  continue stretching program she learned earlier    Consulted and Agree with Plan of Care  Patient       Patient will benefit from skilled therapeutic intervention in order to improve the following deficits and impairments:  Decreased range of motion, Pain, Increased fascial restricitons  Visit Diagnosis: Stiffness of right shoulder, not elsewhere classified  Other symptoms and signs involving the musculoskeletal system  Disorder of the skin and subcutaneous tissue related to radiation, unspecified     Problem List Patient Active Problem List   Diagnosis Date Noted  . Cancer of central portion of right female breast ([place]) 01/28/2017  . 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, estrogen receptor positive ([place]) 01/08/2017  [place] 11/08/2017, 10:13 AM  [place], [place], [address] Phone: [phone]   Fax:  [phone]  Name: Angelica Floyd MRN: [id number] Date of Birth: 04/09/1966  Serafina Royals, PT 11/08/17 10:13 AM

## 2017-11-08 NOTE — ED Provider Notes (Signed)
[place]   [id number] 11/08/17 Arrival Time: 0954   SUBJECTIVE:  Angelica Floyd is a 52 y.o. female who presents to the urgent care with complaint of new chest nodule found upper left subclavicular area last night.  The nodule is nontender.  Patient had a Port-A-Cath for her chemotherapy last year when she had breast cancer in her right breast and subsequent mastectomy with chemotherapy.     Past Medical History:  Diagnosis Date  . Anxiety   . History of chemotherapy    finished chemo 07/29/2017  . 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 Seasonal allergies    History reviewed. No pertinent family history. Social History   Socioeconomic History  . Marital status: Married    Spouse name: Not on file  . Number of children: Not on file  . Years of education: Not on file  . Highest education level: Not on file  Occupational History  . Not on file  Social Needs  . Financial resource strain: Not on file  . Food insecurity:    Worry: Not on file    Inability: Not on file  . Transportation needs:    Medical: Not on file    Non-medical: Not on file  Tobacco Use  . Smoking status: Never Smoker  . Smokeless tobacco: Never Used  Substance and Sexual Activity  . Alcohol use: Not Currently  . Drug use: No  . Sexual activity: Not on file  Lifestyle  . Physical activity:    Days per week: Not on file    Minutes per session: Not on file  . Stress: Not on file  Relationships  . Social connections:    Talks on phone: Not on file    Gets together: Not on file    Attends religious service: Not on file    Active member of club or organization: Not on file    Attends meetings of clubs or organizations: Not on file    Relationship status: Not on file  . Intimate partner violence:    Fear of current or ex partner: Not on file    Emotionally abused: Not on file    Physically abused: Not on  file    Forced sexual activity: Not on file  Other Topics Concern  . Not on file  Social History Narrative  . Not on file   Current Meds  Medication Sig  . letrozole (FEMARA) 2.5 MG tablet Take 1 tablet (2.5 mg total) by mouth daily.  . Multiple Vitamin (MULTIVITAMIN) tablet Take 1 tablet by mouth daily.   No Known Allergies    ROS: As per HPI, remainder of ROS negative.   OBJECTIVE:   Vitals:   11/08/17 1010  BP: (!) 144/85  Pulse: (!) 101  Resp: 20  Temp: 98.4 F (36.9 C)  SpO2: 99%     General appearance: alert; no distress Eyes: PERRL; EOMI; conjunctiva normal HENT: normocephalic; atraumatic;  oral mucosa normal Neck: supple Chest: Left lateral subclavicular area has a well-healed surgical scar from Port-A-Cath.  Beneath this there is a 4 mm hard fixed nodule with no tenderness or fluctuance. Back: no CVA tenderness Extremities: no cyanosis or edema; symmetrical with no gross deformities Skin: warm and dry Neurologic: normal gait; grossly normal Psychological: alert and cooperative; normal mood and affect      Labs:  Results for orders placed or performed in visit on 37/10/62  FSH-Follicle stimulating hormone  Result Value Ref Range   FSH 135.0 mIU/mL    Labs Reviewed - No data to display  No results found.     ASSESSMENT & PLAN:  1. Nodule of anterior chest wall   Location, size, and texture of this nodule make this almost certainly a scar tissue from your Port-A-Cath.  Nevertheless, having had your experience and knowing that physical exam is not completely reliable, you need to make an appointment with your surgeon as soon as you get back from vacation to have this evaluated further.  This is not a clot and will not cause any sudden death.  No orders of the defined types were placed in this encounter.   Reviewed expectations re: course of current medical issues. Questions answered. Outlined signs and symptoms indicating need for more  acute intervention. Patient verbalized understanding. After Visit Summary given.       Robyn Haber, MD 11/08/17 [phone]

## 2017-11-26 ENCOUNTER — Ambulatory Visit
Admission: RE | Admit: 2017-11-26 | Discharge: 2017-11-26 | Disposition: A | Discharge status: Home / Self Care | Payer: BLUE CROSS/BLUE SHIELD | Source: Ambulatory Visit | Attending: Hematology and Oncology | Admitting: Hematology and Oncology

## 2017-11-26 DIAGNOSIS — Z780 Asymptomatic menopausal state: Secondary | ICD-10-CM

## 2017-11-28 ENCOUNTER — Encounter: Payer: Self-pay | Admitting: Radiation Oncology

## 2017-11-28 ENCOUNTER — Other Ambulatory Visit: Payer: Self-pay

## 2017-11-28 ENCOUNTER — Ambulatory Visit
Admission: RE | Admit: 2017-11-28 | Discharge: 2017-11-28 | Disposition: A | Discharge status: Home / Self Care | Payer: BLUE CROSS/BLUE SHIELD | Source: Ambulatory Visit | Attending: Radiation Oncology | Admitting: Radiation Oncology

## 2017-11-28 VITALS — BP 125/86 | HR 90 | Temp 98.1°F | Resp 18 | Wt 122.6 lb

## 2017-11-28 DIAGNOSIS — C50511 Malignant neoplasm of lower-outer quadrant of right female breast: Secondary | ICD-10-CM

## 2017-11-28 DIAGNOSIS — Z170 Estrogen receptor positive status [ER+]: Principal | ICD-10-CM

## 2017-11-28 NOTE — Progress Notes (Signed)
Radiation Oncology         [phone] ________________________________  Name: Angelica Floyd MRN: [id number]  Date: 11/28/2017  DOB: 08/05/1965  Follow-Up Visit Note  CC: Angelica Booze, NP  Angelica Lose, MD    ICD-10-CM   1. 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, Grade 1 ER+/PR+, HER2-  Cancer Staging 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, estrogen receptor positive (Angelica Floyd) Staging form: Breast, AJCC 8th Edition - Clinical stage from 01/09/2017: Stage IB (cT2, cN0, cM0, G2, ER: Positive, PR: Positive, HER2: Negative) - Unsigned - Pathologic stage from 02/13/2017: Stage IIIA (pT2, pN3a, cM0, G1, ER+, PR+, HER2-) - Signed by Angelica Pray, MD on 08/01/2017  Interval Since Last Radiation: 2 months, 08/20/2017 - 10/02/2017 Right chest wall/ 50 Gy in 25 fractions Right axilla/ 45 Gy in 25 fractions Right chest wall boost/ 10 Gy in 5 fractions  Narrative:  The patient returns today for close follow-up. She recently returned from vacation.  On 11/08/17, she reported to urgent care for nodule to her left chest wall. She will see her surgeon next week. This was near her Port-A-Cath site and urgent care MD felt this was related to scar tissue from her Port-A-Cath insertion.  On review of systems, patient notes fatigue and tenderness to radiation site. She denies any other symptoms.  ALLERGIES:  has No Known Allergies.  Meds: Current Outpatient Medications  Medication Sig Dispense Refill  . acetaminophen (TYLENOL) 325 MG tablet Take 650 mg by mouth every 6 (six) hours as needed.    . calcium-vitamin D (OSCAL WITH D) 500-200 MG-UNIT tablet Take 2 tablets by mouth.    . letrozole (FEMARA) 2.5 MG tablet Take 1 tablet (2.5 mg total) by mouth daily. 90 tablet 3  . Multiple Vitamin (MULTIVITAMIN) tablet Take  1 tablet by mouth daily.    Marland Kitchen HYDROcodone-acetaminophen (NORCO/VICODIN) 5-325 MG tablet Take 1-2 tablets by mouth every 6 (six) hours as needed for moderate pain or severe pain. (Patient not taking: Reported on 10/23/2017) 10 tablet 0  . LORazepam (ATIVAN) 0.5 MG tablet Take 1 tablet (0.5 mg total) by mouth at bedtime. (Patient not taking: Reported on 11/28/2017) 30 tablet 0   No current facility-administered medications for this encounter.     Physical Findings: The patient is in no acute distress. Patient is alert and oriented.  weight is 122 lb 9.6 oz (55.6 kg). Her oral temperature is 98.1 F (36.7 C). Her blood pressure is 125/86 and her pulse is 90. Her respiration is 18 and oxygen saturation is 100%. .   Lungs are clear to auscultation bilaterally. Heart has regular rate and rhythm. No palpable cervical, supraclavicular, or axillary adenopathy. Abdomen soft, non-tender, normal bowel sounds. Right chest wall: shows hyperpigmentation changes, skin is healed well, no palpable signs of recurrence. Left chest wall: 2-3 mm nodule inferior to her port-a-cath scar, which is soft and feel consistent with scar tissue. (Patient will be seeing her surgeon next week regarding this issue.) left mastectomy scar is well healed, with no signs of infection or tumor.  Lab Findings: Lab Results  Component Value Date   WBC 3.1 (L) 07/25/2017   HGB 12.0 07/25/2017   HCT 35.0 07/25/2017   MCV 92.6 07/25/2017   PLT 233 07/25/2017    Radiographic Findings: Dg Bone Density  Result Date: 11/26/2017 EXAM: DUAL X-RAY ABSORPTIOMETRY (DXA) FOR BONE MINERAL DENSITY IMPRESSION: Referring Physician:  Nicholas Floyd Your patient completed a BMD test using Lunar IDXA DXA system ( analysis version: 16 ) manufactured by [employer]. PATIENT: Name: Angelica, Floyd Patient ID: [id number] Birth Date: 1966/05/01 Height: 63.7 in. Sex: Female Measured: 11/26/2017 Weight: 118.8 lbs. Indications: Breast Cancer History,  Caucasian, Estrogen Deficient, Height Loss (781.91), Hysterectomy, Letrozole, Postmenopausal, Secondary Osteoporosis, History of Fracture (Adult) (V15.51) Fractures: Elbow Treatments: Calcium (E943.0), Hormone Therapy For Cancer, Vitamin D (E933.5) ASSESSMENT: The BMD measured at Femur Neck Right is 0.863 g/cm2 with a T-score of -1.3. This patient is considered osteopenic according to [facility]) criteria. The scan quality is good. L-3 was excluded due to degenerative changes. Site Region Measured Date Measured Age YA BMD Significant CHANGE T-score DualFemur Neck Right 11/26/2017    50.8         -1.3    0.863 g/cm2 AP Spine  L1-L4 (L3) 11/26/2017    50.8         -0.6    1.097 g/cm2 DualFemur Total Mean 11/26/2017    50.8         -0.5    0.941 g/cm2 World Health Organization [facility]) criteria for post-menopausal, Caucasian Women: Normal       T-score at or above -1 SD Osteopenia   T-score between -1 and -2.5 SD Osteoporosis T-score at or below -2.5 SD RECOMMENDATION: 1. All patients should optimize calcium and vitamin D intake. 2. Consider FDA approved medical therapies in postmenopausal women and men aged 68 years and older, based on the following: a. A hip or vertebral (clinical or morphometric) fracture b. T- score < or = -2.5 at the femoral neck or spine after appropriate evaluation to exclude secondary causes c. Low bone mass (T-score between -1.0 and -2.5 at the femoral neck or spine) and a 10 year probability of a hip fracture > or = 3% or a 10 year probability of a major osteoporosis-related fracture > or = 20% based on the [place]-adapted WHO algorithm d. Clinician judgment and/or patient preferences may indicate treatment for people with 10-year fracture probabilities above or below these levels FOLLOW-UP: People with diagnosed cases of osteoporosis or at high risk for fracture should have regular bone mineral density tests. For patients eligible for Medicare, routine testing is allowed once  every 2 years. The testing frequency can be increased to one year for patients who have rapidly progressing disease, those who are receiving or discontinuing medical therapy to restore bone mass, or have additional risk factors. I have reviewed this report and agree with the above findings. [facility] FRAX* 10-year Probability of Fracture Based on femoral neck BMD: DualFemur (Right) Major Osteoporotic Fracture: 7.6% Hip Fracture:                0.6% Population:                  [place] (Caucasian) Risk Factors: Secondary Osteoporosis, History of Fracture (Adult) (V15.51) *FRAX is a Materials engineer of the [employer] for Metabolic Bone Disease, a [place] (WHO) [facility]. ASSESSMENT: The probability of a major osteoporotic fracture is 7.6 % within the next ten years. The probability of hip fracture is 0.6 % within the next 10 years. Electronically Signed   By: Rolm Baptise M.D.   On: 11/26/2017 09:17   Impression:  The patient is recovering from the effects of radiation. No  evidence of recurrence on the physical exam. Patient seems to be tolerating letrozole well except for hot flashes.   Plan: PRN follow up. Patient will continue follow up with medical oncology and surgeon.  -----------------------------------  Blair Promise, PhD, MD  This document serves as a record of services personally performed by Angelica Pray, MD. It was created on his behalf by Wilburn Mylar, a trained medical scribe. The creation of this record is based on the scribe's personal observations and the provider's statements to them. This document has been checked and approved by the attending provider.

## 2017-11-28 NOTE — Progress Notes (Signed)
Pt presents today for follow up with Dr. Sondra Come. Pt is unaccompanied. Pt denies c/o pain. Pt reports fatigue as mild. Pt is using lotion on breast/radiation field. Pt reports skin is hyperpigmented but otherwise WNL. Pt reports breast still "feels bruised". Pt reports small "lump" at port insertion/removal site.   BP 125/86 (BP Location: Left Arm, Patient Position: Sitting, Cuff Size: Normal)   Pulse 90   Temp 98.1 F (36.7 C) (Oral)   Resp 18   Wt 122 lb 9.6 oz (55.6 kg)   SpO2 100%   BMI 21.72 kg/m   Wt Readings from Last 3 Encounters:  11/28/17 122 lb 9.6 oz (55.6 kg)  10/17/17 120 lb (54.4 kg)  10/04/17 120 lb 6.4 oz (54.6 kg)

## 2017-12-09 ENCOUNTER — Other Ambulatory Visit: Payer: Self-pay | Admitting: General Surgery

## 2017-12-09 DIAGNOSIS — R222 Localized swelling, mass and lump, trunk: Secondary | ICD-10-CM

## 2017-12-11 ENCOUNTER — Telehealth: Payer: Self-pay

## 2017-12-11 NOTE — Telephone Encounter (Signed)
Spoke with pt reminding of SCP visit with NP on 12/20/17 at 10 am.  Pt said she will come to appt.

## 2017-12-12 ENCOUNTER — Ambulatory Visit
Admission: RE | Admit: 2017-12-12 | Discharge: 2017-12-12 | Disposition: A | Discharge status: Home / Self Care | Payer: BLUE CROSS/BLUE SHIELD | Source: Ambulatory Visit | Attending: General Surgery | Admitting: General Surgery

## 2017-12-12 DIAGNOSIS — R222 Localized swelling, mass and lump, trunk: Secondary | ICD-10-CM

## 2017-12-20 ENCOUNTER — Encounter: Payer: Self-pay | Admitting: *Deleted

## 2017-12-20 ENCOUNTER — Encounter: Payer: Self-pay | Admitting: Adult Health

## 2017-12-20 ENCOUNTER — Inpatient Hospital Stay: Payer: BLUE CROSS/BLUE SHIELD | Attending: Hematology and Oncology | Admitting: Adult Health

## 2017-12-20 ENCOUNTER — Telehealth: Payer: Self-pay | Admitting: Hematology and Oncology

## 2017-12-20 VITALS — BP 123/86 | HR 77 | Temp 97.8°F | Resp 18 | Ht 63.0 in | Wt 121.8 lb

## 2017-12-20 DIAGNOSIS — Z79811 Long term (current) use of aromatase inhibitors: Secondary | ICD-10-CM | POA: Insufficient documentation

## 2017-12-20 DIAGNOSIS — Z9221 Personal history of antineoplastic chemotherapy: Secondary | ICD-10-CM | POA: Insufficient documentation

## 2017-12-20 DIAGNOSIS — Z9013 Acquired absence of bilateral breasts and nipples: Secondary | ICD-10-CM | POA: Insufficient documentation

## 2017-12-20 DIAGNOSIS — C50511 Malignant neoplasm of lower-outer quadrant of right female breast: Secondary | ICD-10-CM | POA: Diagnosis not present

## 2017-12-20 DIAGNOSIS — R232 Flushing: Secondary | ICD-10-CM | POA: Diagnosis not present

## 2017-12-20 DIAGNOSIS — F419 Anxiety disorder, unspecified: Secondary | ICD-10-CM | POA: Diagnosis not present

## 2017-12-20 DIAGNOSIS — M8580 Other specified disorders of bone density and structure, unspecified site: Secondary | ICD-10-CM | POA: Diagnosis not present

## 2017-12-20 DIAGNOSIS — Z79899 Other long term (current) drug therapy: Secondary | ICD-10-CM | POA: Insufficient documentation

## 2017-12-20 DIAGNOSIS — Z170 Estrogen receptor positive status [ER+]: Secondary | ICD-10-CM | POA: Diagnosis not present

## 2017-12-20 DIAGNOSIS — Z923 Personal history of irradiation: Secondary | ICD-10-CM | POA: Insufficient documentation

## 2017-12-20 DIAGNOSIS — M25611 Stiffness of right shoulder, not elsewhere classified: Secondary | ICD-10-CM

## 2017-12-20 NOTE — Telephone Encounter (Signed)
Gave patient avs and calendar.  Patient needed early a.m. Appt.

## 2017-12-20 NOTE — Progress Notes (Signed)
CLINIC:  Survivorship   REASON FOR VISIT:  Routine follow-up post-treatment for a recent history of breast cancer.  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 HER-2 negative ratio 1.31 Ki-67 3% in addition calcifications UIQ 1.4 cm stereotactic biopsy flat epithelial atypia; clips are 4.3 cm apart, T2 N0 stage IB clinical stage AJCC 8    01/28/2017 Surgery    Bilateral mastectomies: Right: Grade 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%, PR 95%, HER-2 negative ratio 1.31, Ki-67 3%, 4/4 lymph nodes positive; left mastectomy: PASH and FC changes, no malignancy; T2 N2,  stage IIA AJCC 8    02/14/2017 Surgery    Right axillary lymph node dissection 8/11 lymph nodes positive    03/06/2017 - 07/25/2017 Chemotherapy    Dose dense AC x4 followed by Taxol x12    08/20/2017 - 10/02/2017 Radiation Therapy    Adj XRT    10/2017 -  Anti-estrogen oral therapy    Letrozole daily     INTERVAL HISTORY:  Angelica Floyd presents to the [facility] today for our initial meeting to review her survivorship care plan detailing her treatment course for breast cancer, as well as monitoring long-term side effects of that treatment, education regarding health maintenance, screening, and overall wellness and health promotion.     Overall, Angelica Floyd reports feeling quite well.  She is taking Letrozole daily and is tolerating it well.  She has some hot flashes, but they are brief and manageable.  She also notes joint stiffness at night/morning, that improves with movement.  She denies vaginal dryness.    She continues to work from home in [employer], [employer].    Angelica Floyd notes that she has a small nodule in her left upper outer quadrant of left chest wall just below her port  incision scar that underwent ultrasound.  It was consistent with fat necrosis, but she tells me that it will be monitored closely.  She also notes some difficulty with ROM limitation in her right shoulder and wonders if she should return to physical therapy.      REVIEW OF SYSTEMS:  Review of Systems  Constitutional: Negative for appetite change, chills, fatigue, fever and unexpected weight change.  HENT:   Negative for hearing loss, lump/mass and trouble swallowing.   Eyes: Negative for eye problems and icterus.  Respiratory: Negative for chest tightness, cough and shortness of breath.   Cardiovascular: Negative for chest pain, leg swelling and palpitations.  Gastrointestinal: Negative for abdominal distention, abdominal pain, constipation, diarrhea, nausea and vomiting.  Endocrine: Positive for hot flashes.  Musculoskeletal: Positive for arthralgias.  Skin: Negative for itching and rash.  Neurological: Negative for dizziness, extremity weakness, headaches and numbness.  Hematological: Negative for adenopathy. Does not bruise/bleed easily.  Psychiatric/Behavioral: The patient is not nervous/anxious.   Breast: Denies any new nodularity, masses, tenderness, nipple changes, or nipple discharge.      ONCOLOGY TREATMENT TEAM:  1. Surgeon:  Dr. Marlou Starks at [facility] 2. Medical Oncologist: Dr. Lindi Adie  3. Radiation Oncologist: Dr. Sondra Come    PAST MEDICAL/SURGICAL HISTORY:  Past Medical History:  Diagnosis Date  . Anxiety   . History of chemotherapy    finished chemo 07/29/2017  . 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  of female, estrogen receptor positive ([place]) 01/08/2017  . Seasonal allergies    Past Surgical History:  Procedure Laterality Date  . ABDOMINAL HYSTERECTOMY     partial  . ADENOIDECTOMY W/ MYRINGOTOMY    . APPENDECTOMY    . AXILLARY LYMPH NODE DISSECTION Right 02/14/2017   Procedure: RIGHT AXILLARY LYMPH NODE DISSECTION ERAS PATHWAY;   Surgeon: Jovita Kussmaul, MD;  Location: [place];  Service: General;  Laterality: Right;  . CESAREAN SECTION    . MASTECTOMY W/ SENTINEL NODE BIOPSY Bilateral 01/28/2017   RIGHT BREAST BIOPSY  . MASTECTOMY W/ SENTINEL NODE BIOPSY Bilateral 01/28/2017   Procedure: BILATERAL MASTECTOMY WITH RIGHT SENTINEL LYMPH NODE BIOPSY;  Surgeon: Jovita Kussmaul, MD;  Location: [place];  Service: General;  Laterality: Bilateral;  . PORT-A-CATH REMOVAL Left 08/08/2017   Procedure: REMOVAL PORT-A-CATH;  Surgeon: Jovita Kussmaul, MD;  Location: [place];  Service: General;  Laterality: Left;  . PORTACATH PLACEMENT Left 02/14/2017   Procedure: INSERTION PORT-A-CATH;  Surgeon: Jovita Kussmaul, MD;  Location: [place];  Service: General;  Laterality: Left;  . RE-EXCISION OF BREAST CANCER,SUPERIOR MARGINS N/A 02/14/2017   Procedure: RE-EXCISION INFERIOR FLAP;  Surgeon: Jovita Kussmaul, MD;  Location: [place];  Service: General;  Laterality: N/A;  . WISDOM TOOTH EXTRACTION       ALLERGIES:  No Known Allergies   CURRENT MEDICATIONS:  Outpatient Encounter Medications as of 12/20/2017  Medication Sig  . acetaminophen (TYLENOL) 325 MG tablet Take 650 mg by mouth every 6 (six) hours as needed.  . calcium-vitamin D (OSCAL WITH D) 500-200 MG-UNIT tablet Take 2 tablets by mouth.  . letrozole (FEMARA) 2.5 MG tablet Take 1 tablet (2.5 mg total) by mouth daily.  Marland Kitchen LORazepam (ATIVAN) 0.5 MG tablet Take 1 tablet (0.5 mg total) by mouth at bedtime.  . Multiple Vitamin (MULTIVITAMIN) tablet Take 1 tablet by mouth daily.  . [DISCONTINUED] HYDROcodone-acetaminophen (NORCO/VICODIN) 5-325 MG tablet Take 1-2 tablets by mouth every 6 (six) hours as needed for moderate pain or severe pain. (Patient not taking: Reported on 10/23/2017)   No facility-administered encounter medications on file as of 12/20/2017.      ONCOLOGIC FAMILY HISTORY:  Non contributory   GENETIC COUNSELING/TESTING: Not at this time  SOCIAL HISTORY:    Social History   Socioeconomic History  . Marital status: Married    Spouse name: Not on file  . Number of children: Not on file  . Years of education: Not on file  . Highest education level: Not on file  Occupational History  . Not on file  Social Needs  . Financial resource strain: Not on file  . Food insecurity:    Worry: Not on file    Inability: Not on file  . Transportation needs:    Medical: Not on file    Non-medical: Not on file  Tobacco Use  . Smoking status: Never Smoker  . Smokeless tobacco: Never Used  Substance and Sexual Activity  . Alcohol use: Not Currently  . Drug use: No  . Sexual activity: Not on file  Lifestyle  . Physical activity:    Days per week: Not on file    Minutes per session: Not on file  . Stress: Not on file  Relationships  . Social connections:    Talks on phone: Not on file    Gets together: Not on file    Attends religious service: Not on file    Active member of club  or organization: Not on file    Attends meetings of clubs or organizations: Not on file    Relationship status: Not on file  . Intimate partner violence:    Fear of current or ex partner: Not on file    Emotionally abused: Not on file    Physically abused: Not on file    Forced sexual activity: Not on file  Other Topics Concern  . Not on file  Social History Narrative  . Not on file      PHYSICAL EXAMINATION:  Vital Signs:   Vitals:   12/20/17 0946  BP: 123/86  Pulse: 77  Resp: 18  Temp: 97.8 F (36.6 C)  SpO2: 100%   Filed Weights   12/20/17 0946  Weight: 121 lb 12.8 oz (55.2 kg)   General: Well-nourished, well-appearing female in no acute distress.  She is unaccompanied today.   HEENT: Head is normocephalic.  Pupils equal and reactive to light. Conjunctivae clear without exudate.  Sclerae anicteric. Oral mucosa is pink, moist.  Oropharynx is pink without lesions or erythema.  Lymph: No cervical, supraclavicular, or infraclavicular  lymphadenopathy noted on palpation.  Cardiovascular: Regular rate and rhythm.Marland Kitchen Respiratory: Clear to auscultation bilaterally. Chest expansion symmetric; breathing non-labored.  Breasts: right chest wall s/p mastectomy, without nodules, masses, thickness noted, left chest wall, s/p mastectomy without nodules, masses noted, she does have a small 62m nodule about1cm below the port scar line GI: Abdomen soft and round; non-tender, non-distended. Bowel sounds normoactive.  GU: Deferred.  Neuro: No focal deficits. Steady gait.  Psych: Mood and affect normal and appropriate for situation.  Extremities: No edema. MSK: No focal spinal tenderness to palpation. Limited right shoulder ROM on abduction  Full range of motion in bilateral upper extremities Skin: Warm and dry.  LABORATORY DATA:  None for this visit.  DIAGNOSTIC IMAGING:  None for this visit.   ASSESSMENT AND PLAN:  Ms.. PGendronis a pleasant 52y.o. 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  She presents to the Survivorship Clinic for our initial meeting and routine follow-up post-completion of treatment for breast cancer.    1. Stage IIIA right/left breast cancer:  Ms. PRansomis continuing to recover from definitive treatment for breast cancer. She will follow-up with her medical oncologist, Dr. GLindi Adiein 2 months with history and physical exam per surveillance protocol.  She will continue her anti-estrogen therapy with Letrozole. Thus far, she is tolerating the Letrozole well, with minimal side effects.  We discussed Natalee today and she met with research RN to review it today as well.  Today, a comprehensive survivorship care plan and treatment summary was reviewed with the patient today detailing her  breast cancer diagnosis, treatment course, potential late/long-term effects of treatment, appropriate follow-up care with recommendations for the future, and patient education resources.  A copy of this summary, along with a letter will be sent to the patient's primary care provider via mail/fax/In Basket message after today's visit.    2. ROM limitation: referral placed for PT.  3. Bone health:  Given Angelica Floyd's age/history of breast cancer and her current treatment regimen including anti-estrogen therapy with  Letrozole, she is at risk for bone demineralization.  Her last DEXA scan was 11/26/2017, and was consistent with osteopenia with a T s co  In the meantime, she was encouraged  to increase her consumption of foods rich in calcium, as well as increase her weight-bearing activities.  She was given education on specific activities to promote bone health.  4. Cancer screening:  Due to Angelica Floyd's history and her age, she should receive screening for skin cancers, colon cancer, and gynecologic cancers.  The information and recommendations are listed on the patient's comprehensive care plan/treatment summary and were reviewed in detail with the patient.    5. Health maintenance and wellness promotion: Angelica Floyd was encouraged to consume 5-7 servings of fruits and vegetables per day. We reviewed the "Nutrition Rainbow" handout, as well as the handout "Take Control of Your Health and Reduce Your Cancer Risk" from the [facility].  She was also encouraged to engage in moderate to vigorous exercise for 30 minutes per day most days of the week. We discussed the [employer] fitness program, which is designed for cancer survivors to help them become more physically fit after cancer treatments.  She was instructed to limit her alcohol consumption and continue to abstain from tobacco use.     6. Support services/counseling: It is not uncommon for this period of the patient's cancer care  trajectory to be one of many emotions and stressors.  We discussed an opportunity for her to participate in the next session of [facility] ("Finding Your New Normal") support group series designed for patients after they have completed treatment.   Angelica Floyd was encouraged to take advantage of our many other support services programs, support groups, and/or counseling in coping with her new life as a cancer survivor after completing anti-cancer treatment.  She was offered support today through active listening and expressive supportive counseling.  She was given information regarding our available services and encouraged to contact me with any questions or for help enrolling in any of our support group/programs.    Dispo:   -Return to cancer center in 2 months for f/u with Dr. Lindi Adie  -She is welcome to return back to the Survivorship Clinic at any time; no additional follow-up needed at this time.  -Consider referral back to survivorship as a long-term survivor for continued surveillance  A total of (50) minutes of face-to-face time was spent with this patient with greater than 50% of that time in counseling and care-coordination.   Gardenia Phlegm, NP Survivorship Program [facility] [phone]   Note: PRIMARY CARE PROVIDER Jettie Booze, [place] [phone]

## 2017-12-30 ENCOUNTER — Other Ambulatory Visit: Payer: Self-pay

## 2017-12-30 ENCOUNTER — Encounter: Payer: Self-pay | Admitting: Rehabilitation

## 2017-12-30 ENCOUNTER — Ambulatory Visit: Payer: BLUE CROSS/BLUE SHIELD | Attending: Hematology and Oncology | Admitting: Rehabilitation

## 2017-12-30 DIAGNOSIS — M25612 Stiffness of left shoulder, not elsewhere classified: Secondary | ICD-10-CM

## 2017-12-30 DIAGNOSIS — M25611 Stiffness of right shoulder, not elsewhere classified: Secondary | ICD-10-CM

## 2017-12-30 DIAGNOSIS — Z170 Estrogen receptor positive status [ER+]: Secondary | ICD-10-CM | POA: Diagnosis present

## 2017-12-30 DIAGNOSIS — L599 Disorder of the skin and subcutaneous tissue related to radiation, unspecified: Secondary | ICD-10-CM | POA: Diagnosis present

## 2017-12-30 DIAGNOSIS — C50511 Malignant neoplasm of lower-outer quadrant of right female breast: Secondary | ICD-10-CM | POA: Diagnosis present

## 2017-12-30 DIAGNOSIS — Z483 Aftercare following surgery for neoplasm: Secondary | ICD-10-CM | POA: Insufficient documentation

## 2017-12-30 DIAGNOSIS — R29898 Other symptoms and signs involving the musculoskeletal system: Secondary | ICD-10-CM | POA: Diagnosis present

## 2017-12-30 DIAGNOSIS — R293 Abnormal posture: Secondary | ICD-10-CM | POA: Diagnosis present

## 2017-12-30 NOTE — Therapy (Signed)
[place], [place], [address] Phone: [phone]   Fax:  [phone]  Physical Therapy Evaluation  Patient Details  Name: Angelica Floyd MRN: [id number] Date of Birth: Aug 27, 1965 Referring Provider: [name], PA-C   Encounter Date: 12/30/2017  PT End of Session - 12/30/17 0844    Visit Number  1    Number of Visits  9    Date for PT Re-Evaluation  01/27/18    PT Start Time  0802    PT Stop Time  0842    PT Time Calculation (min)  40 min    Activity Tolerance  Patient tolerated treatment well    Behavior During Therapy  [facility] for tasks assessed/performed       Past Medical History:  Diagnosis Date  . Anxiety   . History of chemotherapy    finished chemo 07/29/2017  . 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, estrogen receptor positive ([place]) 01/08/2017  . Seasonal allergies     Past Surgical History:  Procedure Laterality Date  . ABDOMINAL HYSTERECTOMY     partial  . ADENOIDECTOMY W/ MYRINGOTOMY    . APPENDECTOMY    . AXILLARY LYMPH NODE DISSECTION Right 02/14/2017   Procedure: RIGHT AXILLARY LYMPH NODE DISSECTION ERAS PATHWAY;  Surgeon: Jovita Kussmaul, MD;  Location: [place];  Service: General;  Laterality: Right;  . CESAREAN SECTION    . MASTECTOMY W/ SENTINEL NODE BIOPSY Bilateral 01/28/2017   RIGHT BREAST BIOPSY  . MASTECTOMY W/ SENTINEL NODE BIOPSY Bilateral 01/28/2017   Procedure: BILATERAL MASTECTOMY WITH RIGHT SENTINEL LYMPH NODE BIOPSY;  Surgeon: Jovita Kussmaul, MD;  Location: [place];  Service: General;  Laterality: Bilateral;  . PORT-A-CATH REMOVAL Left 08/08/2017   Procedure: REMOVAL PORT-A-CATH;  Surgeon: Jovita Kussmaul, MD;  Location: [place];  Service: General;  Laterality: Left;  . PORTACATH PLACEMENT Left 02/14/2017   Procedure: INSERTION PORT-A-CATH;  Surgeon: Jovita Kussmaul, MD;  Location: [place];  Service: General;  Laterality:  Left;  . RE-EXCISION OF BREAST CANCER,SUPERIOR MARGINS N/A 02/14/2017   Procedure: RE-EXCISION INFERIOR FLAP;  Surgeon: Jovita Kussmaul, MD;  Location: [place];  Service: General;  Laterality: N/A;  . WISDOM TOOTH EXTRACTION      There were no vitals filed for this visit.   Subjective Assessment - 12/30/17 0805    Subjective  Just want to get the rest of the shoulder ROM. Some strengthening as well     Pertinent History  Bilateral mastectomy for right hormone receptive breast cancer with right SLNB 01/28/17 followed by re-excision and ALND 02/14/17. Finished chemo week of 07/22/17.  Completed chemo 10/03/2017     Patient Stated Goals  just improve ROM; play pickleball better     Currently in Pain?  No/denies         [facility] PT Assessment - 12/30/17 0001      Assessment   Medical Diagnosis  right breast cancer    Referring Provider  [name], PA-C    Onset Date/Surgical Date  01/28/17    Hand Dominance  Right    Prior Therapy  yes here      Precautions   Precaution Comments  cancer precautions      Restrictions   Weight Bearing Restrictions  No      Balance Screen   Has the patient fallen in the past 6 months  No    Has the patient had a  decrease in activity level because of a fear of falling?   No    Is the patient reluctant to leave their home because of a fear of falling?   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  Pt has strength ABC program but not really doing it      Cognition   Overall Cognitive Status  Within Functional Limits for tasks assessed      ROM / Strength   AROM / PROM / Strength  Strength;PROM      AROM   Right Shoulder Flexion   140 Degrees    Right Shoulder ABduction  136 Degrees    Right Shoulder Internal Rotation  70 Degrees    Right Shoulder External Rotation  80 Degrees    Left Shoulder Flexion  150 Degrees    Left Shoulder ABduction  155 Degrees    Left Shoulder Internal Rotation  90 Degrees    Left Shoulder External Rotation  70 Degrees      PROM   PROM Assessment Site  Shoulder    Right/Left Shoulder  Right    Right Shoulder Flexion  146 Degrees    Right Shoulder ABduction  155 Degrees    Right Shoulder Internal Rotation  80 Degrees    Right Shoulder External Rotation  60 Degrees      Strength   Overall Strength Comments  not overall weak but not consistent in the hold    Strength Assessment Site  Shoulder    Right/Left Shoulder  Right;Left    Right Shoulder Flexion  4/5    Right Shoulder ABduction  4/5    Right Shoulder Internal Rotation  4/5    Right Shoulder External Rotation  4/5    Left Shoulder Flexion  4+/5    Left Shoulder ABduction  5/5    Left Shoulder Internal Rotation  4+/5    Left Shoulder External Rotation  4+/5      Palpation   Palpation comment  Rt radiation fibrosis over Rt breast incision but able to slide skin over chest wall         LYMPHEDEMA/ONCOLOGY QUESTIONNAIRE - 12/30/17 0842      Type   Cancer Type  s/p bil mastectomy      Surgeries   Mastectomy Date  01/28/17    Sentinel Lymph Node Biopsy Date  01/28/17    Axillary Lymph Node Dissection Date  02/14/17    Number Lymph Nodes Removed  15      Treatment   Active Chemotherapy Treatment  No    Past Chemotherapy Treatment  Yes    Active Radiation Treatment  No    Past Radiation Treatment  Yes    Current Hormone Treatment  No    Past Hormone Therapy  Yes    Date  03/06/17    Drug Name  Tamoxifen      What other symptoms do you have   Are you Having Heaviness or Tightness  No    Are you having Pain  No  Lymphedema Stage   Stage  --   did not want to remeasure today due to no concerns          Katina Dung - 12/30/17 0001    Open a tight or new jar  Mild difficulty    Do heavy household chores (wash walls, wash floors)  Moderate difficulty    Carry a shopping bag or briefcase  No difficulty    Wash your back  Mild difficulty    Use a knife to cut food  No difficulty    Recreational activities in which you take some force or impact through your arm, shoulder, or hand (golf, hammering, tennis)  Mild difficulty    During the past week, to what extent has your arm, shoulder or hand problem interfered with your normal social activities with family, friends, neighbors, or groups?  Not at all    During the past week, to what extent has your arm, shoulder or hand problem limited your work or other regular daily activities  Slightly    Arm, shoulder, or hand pain.  Mild    Tingling (pins and needles) in your arm, shoulder, or hand  None    Difficulty Sleeping  Mild difficulty    DASH Score  18.18 %        Objective measurements completed on examination: See above findings.      [facility] Adult PT Treatment/Exercise - 12/30/17 0001      Exercises   Exercises  Shoulder      Shoulder Exercises: Stretch   Other Shoulder Stretches  performed in clinic for HEP: supine flexion 3x30", doorway stretch 1x30", supine behind head chest stretch x 60", open book 5x5" with cueing for all              PT Education - 12/30/17 0844    Education provided  Yes    Education Details  new HEP, POC    Person(s) Educated  Patient    Methods  Explanation;Demonstration;Verbal cues;Handout    Comprehension  Verbalized understanding       PT Short Term Goals - 07/22/17 1437      PT SHORT TERM GOAL #1   Title  Pt. will report perception of at least 30% decrease in right axillary cording.    Baseline  Maybe 30% better as of 07/22/17    Status  Achieved        PT Long Term Goals - 12/30/17 1024      PT LONG TERM GOAL #1   Title  Pt will play pickleball without limitations in  overhead play    Time  3    Period  Weeks    Status  New    Target Date  01/27/18      PT LONG TERM GOAL #2   Title  Pt will improve Rt shoulder AROM to flex: 150,  abd; 155,  ER: 85    Time  3    Period  Weeks    Status  New    Target Date  01/27/18      PT LONG TERM GOAL #3   Title  Pt will report no limitations with reaching overhead     Time  3    Period  Weeks    Status  New    Target Date  01/27/18      PT LONG TERM GOAL #4   Title  Pt will demonstrate 5/5 Rt shoulder strength with no shakiness during holds  Time  3    Period  Weeks    Status  New    Target Date  01/27/18      PT LONG TERM GOAL #5   Title  Pt will improve QDASH to 10% or less    Time  3    Period  Weeks    Status  New    Target Date  01/27/18             Plan - 12/30/17 0845    Clinical Impression Statement  Angelica Floyd returns to PT wanting to see if she can improve the last of the Rt shoulder stiffness and lack of strength.  She was seen here previously for the same.  She has limited shoulder end range of motion on the Rt, limited ROM with reaching and playing pickleball, and weakness/loss of dynamic stability on the Rt side/shoulder.   The Left shoulder has some decreased ROM as may be addressed as needed     History and Personal Factors relevant to plan of care:  Bilateral mastectomy for right hormone receptive breast cancer with right SLNB 01/28/17 followed by re-excision and ALND 02/14/17. Finished chemo week of 07/22/17.  Completed chemo 10/03/2017     Clinical Presentation  Stable    Clinical Decision Making  Low    Rehab Potential  Excellent    Clinical Impairments Affecting Rehab Potential  finished chemo 07/25/17; radiation from 08/30/17-10/02/17    PT Frequency  2x / week    PT Duration  3 weeks    PT Treatment/Interventions  ADLs/Self Care Home Management;Therapeutic exercise;Patient/family education;Manual techniques;Manual lymph drainage;Scar mobilization;Passive range of motion;Taping     PT Next Visit Plan  Rt shoulder PROM, myofascial release, working into scapular strengthening and shoulder strengthening (has strength ABC program already but not doing it)  Lt shoulder needs?     PT Home Exercise Plan  given medbridge handout 12/30/17,  Access Code: [email or web address]        Patient will benefit from skilled therapeutic intervention in order to improve the following deficits and impairments:  Decreased range of motion, Pain, Increased fascial restricitons, Decreased strength  Visit Diagnosis: Stiffness of right shoulder, not elsewhere classified  Disorder of the skin and subcutaneous tissue related to radiation, unspecified  Abnormal posture  Aftercare following surgery for neoplasm  Stiffness of left shoulder, not elsewhere classified     Problem List Patient Active Problem List   Diagnosis Date Noted  . Cancer of central portion of right female breast ([place]) 01/28/2017  . 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, estrogen receptor positive ([place]) 01/08/2017    Angelica Floyd, PT 12/30/2017, 11:52 AM  [place], [place], [address] Phone: [phone]   Fax:  [phone]  Name: Angelica Floyd MRN: [id number] Date of Birth: 08/26/1965

## 2017-12-30 NOTE — Patient Instructions (Signed)
Access Code: [email or web address]  URL: [email or web address]  Date: 12/30/2017  Prepared by: Shan Levans   Exercises  Supine Shoulder Flexion PROM - 3 reps - 1 sets - 30 hold - 1x daily - 7x weekly  Sidelying Thoracic Rotation with Open Book - 5 reps - 5 sec hold - 1x daily - 7x weekly  Supine Chest Stretch with Elbows Bent - 3 reps - 30sec hold - 1x daily - 7x weekly  Doorway Pec Stretch at 120 Degrees Abduction - 3 reps - 30 hold - 1x daily - 7x weekly

## 2018-01-03 ENCOUNTER — Ambulatory Visit: Payer: BLUE CROSS/BLUE SHIELD | Admitting: Physical Therapy

## 2018-01-08 ENCOUNTER — Encounter: Payer: Self-pay | Admitting: Rehabilitation

## 2018-01-08 ENCOUNTER — Ambulatory Visit: Payer: BLUE CROSS/BLUE SHIELD | Admitting: Rehabilitation

## 2018-01-08 DIAGNOSIS — M25611 Stiffness of right shoulder, not elsewhere classified: Secondary | ICD-10-CM | POA: Diagnosis not present

## 2018-01-08 DIAGNOSIS — Z483 Aftercare following surgery for neoplasm: Secondary | ICD-10-CM

## 2018-01-08 DIAGNOSIS — Z170 Estrogen receptor positive status [ER+]: Secondary | ICD-10-CM

## 2018-01-08 DIAGNOSIS — L599 Disorder of the skin and subcutaneous tissue related to radiation, unspecified: Secondary | ICD-10-CM

## 2018-01-08 DIAGNOSIS — C50511 Malignant neoplasm of lower-outer quadrant of right female breast: Secondary | ICD-10-CM

## 2018-01-08 NOTE — Therapy (Signed)
[place], [place], [address] Phone: [phone]   Fax:  [phone]  Physical Therapy Treatment  Patient Details  Name: Angelica Floyd MRN: [id number] Date of Birth: 1965-10-30 Referring Provider: [name], PA-C   Encounter Date: 01/08/2018  PT End of Session - 01/08/18 0852    Visit Number  2    Number of Visits  9    Date for PT Re-Evaluation  01/27/18    PT Start Time  0848    PT Stop Time  0927    PT Time Calculation (min)  39 min    Activity Tolerance  Patient tolerated treatment well    Behavior During Therapy  [facility] for tasks assessed/performed       Past Medical History:  Diagnosis Date  . Anxiety   . History of chemotherapy    finished chemo 07/29/2017  . 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, estrogen receptor positive ([place]) 01/08/2017  . Seasonal allergies     Past Surgical History:  Procedure Laterality Date  . ABDOMINAL HYSTERECTOMY     partial  . ADENOIDECTOMY W/ MYRINGOTOMY    . APPENDECTOMY    . AXILLARY LYMPH NODE DISSECTION Right 02/14/2017   Procedure: RIGHT AXILLARY LYMPH NODE DISSECTION ERAS PATHWAY;  Surgeon: Jovita Kussmaul, MD;  Location: [place];  Service: General;  Laterality: Right;  . CESAREAN SECTION    . MASTECTOMY W/ SENTINEL NODE BIOPSY Bilateral 01/28/2017   RIGHT BREAST BIOPSY  . MASTECTOMY W/ SENTINEL NODE BIOPSY Bilateral 01/28/2017   Procedure: BILATERAL MASTECTOMY WITH RIGHT SENTINEL LYMPH NODE BIOPSY;  Surgeon: Jovita Kussmaul, MD;  Location: [place];  Service: General;  Laterality: Bilateral;  . PORT-A-CATH REMOVAL Left 08/08/2017   Procedure: REMOVAL PORT-A-CATH;  Surgeon: Jovita Kussmaul, MD;  Location: [place];  Service: General;  Laterality: Left;  . PORTACATH PLACEMENT Left 02/14/2017   Procedure: INSERTION PORT-A-CATH;  Surgeon: Jovita Kussmaul, MD;  Location: [place];  Service: General;  Laterality: Left;   . RE-EXCISION OF BREAST CANCER,SUPERIOR MARGINS N/A 02/14/2017   Procedure: RE-EXCISION INFERIOR FLAP;  Surgeon: Jovita Kussmaul, MD;  Location: [place];  Service: General;  Laterality: N/A;  . WISDOM TOOTH EXTRACTION      There were no vitals filed for this visit.  Subjective Assessment - 01/08/18 0846    Subjective  Has been doing the new stretches     Pertinent History  Bilateral mastectomy for right hormone receptive breast cancer with right SLNB 01/28/17 followed by re-excision and ALND 02/14/17. Finished chemo week of 07/22/17.  Completed chemo 10/03/2017     Patient Stated Goals  just improve ROM; play pickleball better     Currently in Pain?  No/denies                       [facility] Adult PT Treatment/Exercise - 01/08/18 0001      Exercises   Exercises  Shoulder      Shoulder Exercises: Prone   Horizontal ABduction 1  Both;10 reps   5" holds   Other Prone Exercises  prone arm lift from 90 moving to overhead with 2" hover from the table x 10 bil      Shoulder Exercises: Pulleys   Flexion  2 minutes    ABduction  2 minutes      Shoulder Exercises: Therapy Ball   Flexion  Both;10 reps    Flexion  Limitations  hold at the end    ABduction  Right;Left;10 reps    ABduction Limitations  hold at the end      Shoulder Exercises: Stretch   Other Shoulder Stretches  doorway stretch 3x20" at 90deg at  as high as possible,       Manual Therapy   Manual therapy comments  scar release Rt mastectomy incision with instruction on self massage    Soft tissue mobilization  In Lt sidelying to the Rt latissimus     Myofascial Release  crosshands across right axilla and chest, but limited by discomfort of the pressure at right upper arm.  Rt. UE myofascial pulling with some concurrent distraction at right axilla, but discomfort with hand grip on arm for this one. In supine, crosshands from right upper arm to mid-chest. At right edge of mat, right arm in horizontal abduction and one  hand supporting/distracting upper arm with other hand releasing at right chest.    Passive ROM  In supine for Rt. shoulder er, abduction and flexion. In supine over towel roll, prolonged pect minor stretches to tolerance.               PT Short Term Goals - 07/22/17 1437      PT SHORT TERM GOAL #1   Title  Pt. will report perception of at least 30% decrease in right axillary cording.    Baseline  Maybe 30% better as of 07/22/17    Status  Achieved        PT Long Term Goals - 12/30/17 1024      PT LONG TERM GOAL #1   Title  Pt will play pickleball without limitations in overhead play    Time  3    Period  Weeks    Status  New    Target Date  01/27/18      PT LONG TERM GOAL #2   Title  Pt will improve Rt shoulder AROM to flex: 150,  abd; 155,  ER: 85    Time  3    Period  Weeks    Status  New    Target Date  01/27/18      PT LONG TERM GOAL #3   Title  Pt will report no limitations with reaching overhead     Time  3    Period  Weeks    Status  New    Target Date  01/27/18      PT LONG TERM GOAL #4   Title  Pt will demonstrate 5/5 Rt shoulder strength with no shakiness during holds    Time  3    Period  Weeks    Status  New    Target Date  01/27/18      PT LONG TERM GOAL #5   Title  Pt will improve QDASH to 10% or less    Time  3    Period  Weeks    Status  New    Target Date  01/27/18            Plan - 01/08/18 5956    Clinical Impression Statement  Tolerated all well today.  Decreased ROM seems to be more soft tissue and scar tissue related vs joint mobility.  Pt with improved flexion after manual work today.      Clinical Impairments Affecting Rehab Potential  finished chemo 07/25/17; radiation from 08/30/17-10/02/17    PT Treatment/Interventions  ADLs/Self Care Home Management;Therapeutic exercise;Patient/family education;Manual techniques;Manual lymph  drainage;Scar mobilization;Passive range of motion;Taping    PT Next Visit Plan  Rt shoulder PROM,  myofascial release, working into scapular strengthening and shoulder strengthening (has strength ABC program already but not doing it)  Lt shoulder needs?     PT Home Exercise Plan  given medbridge handout 12/30/17,  Access Code: [email or web address]        Patient will benefit from skilled therapeutic intervention in order to improve the following deficits and impairments:  Decreased range of motion, Pain, Increased fascial restricitons, Decreased strength  Visit Diagnosis: Stiffness of right shoulder, not elsewhere classified  Disorder of the skin and subcutaneous tissue related to radiation, unspecified  Aftercare following surgery for neoplasm  Carcinoma of lower-outer quadrant of right breast in female, estrogen receptor positive ([place])     Problem List Patient Active Problem List   Diagnosis Date Noted  . Cancer of central portion of right female breast ([place]) 01/28/2017  . 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, estrogen receptor positive ([place]) 01/08/2017    Shan Levans, PT 01/08/2018, 9:29 AM  [place], [place], [address] Phone: [phone]   Fax:  [phone]  Name: SAHMYA ARAI MRN: [id number] Date of Birth: 1965-10-24

## 2018-01-10 ENCOUNTER — Encounter: Payer: Self-pay | Admitting: Physical Therapy

## 2018-01-10 ENCOUNTER — Ambulatory Visit: Payer: BLUE CROSS/BLUE SHIELD | Admitting: Physical Therapy

## 2018-01-10 ENCOUNTER — Encounter: Payer: Self-pay | Admitting: *Deleted

## 2018-01-10 DIAGNOSIS — M25611 Stiffness of right shoulder, not elsewhere classified: Secondary | ICD-10-CM

## 2018-01-10 DIAGNOSIS — Z483 Aftercare following surgery for neoplasm: Secondary | ICD-10-CM

## 2018-01-10 DIAGNOSIS — R293 Abnormal posture: Secondary | ICD-10-CM

## 2018-01-10 DIAGNOSIS — R29898 Other symptoms and signs involving the musculoskeletal system: Secondary | ICD-10-CM

## 2018-01-10 DIAGNOSIS — L599 Disorder of the skin and subcutaneous tissue related to radiation, unspecified: Secondary | ICD-10-CM

## 2018-01-10 NOTE — Patient Instructions (Addendum)
Foam roller:  Elbows straight , reach to ceiling and and then back down with shoulder blades to hug the roller                        One leg to table top , then the other, then opposite arm and leg                           On all 4s,: cat/cow and childs pose, dead bug

## 2018-01-10 NOTE — Therapy (Signed)
[place], [place], [address] Phone: [phone]   Fax:  [phone]  Physical Therapy Treatment  Patient Details  Name: Angelica Floyd MRN: [id number] Date of Birth: [birth year] Referring Provider: [name], PA-C   Encounter Date: 01/10/2018  PT End of Session - 01/10/18 1245    Visit Number  3    Number of Visits  9    Date for PT Re-Evaluation  01/27/18       Past Medical History:  Diagnosis Date  . Anxiety   . History of chemotherapy    finished chemo 07/29/2017  . 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 Seasonal allergies     Past Surgical History:  Procedure Laterality Date  . ABDOMINAL HYSTERECTOMY     partial  . ADENOIDECTOMY W/ MYRINGOTOMY    . APPENDECTOMY    . AXILLARY LYMPH NODE DISSECTION Right 02/14/2017   Procedure: RIGHT AXILLARY LYMPH NODE DISSECTION ERAS PATHWAY;  Surgeon: Jovita Kussmaul, MD;  Location: [facility];  Service: General;  Laterality: Right;  . CESAREAN SECTION    . MASTECTOMY W/ SENTINEL NODE BIOPSY Bilateral 01/28/2017   RIGHT BREAST BIOPSY  . MASTECTOMY W/ SENTINEL NODE BIOPSY Bilateral 01/28/2017   Procedure: BILATERAL MASTECTOMY WITH RIGHT SENTINEL LYMPH NODE BIOPSY;  Surgeon: Jovita Kussmaul, MD;  Location: [facility];  Service: General;  Laterality: Bilateral;  . PORT-A-CATH REMOVAL Left 08/08/2017   Procedure: REMOVAL PORT-A-CATH;  Surgeon: Jovita Kussmaul, MD;  Location: [facility];  Service: General;  Laterality: Left;  . PORTACATH PLACEMENT Left 02/14/2017   Procedure: INSERTION PORT-A-CATH;  Surgeon: Jovita Kussmaul, MD;  Location: [facility];  Service: General;  Laterality: Left;  . RE-EXCISION OF BREAST CANCER,SUPERIOR MARGINS N/A 02/14/2017   Procedure: RE-EXCISION INFERIOR FLAP;  Surgeon: Jovita Kussmaul, MD;  Location: [facility];  Service: General;  Laterality: N/A;  . WISDOM  TOOTH EXTRACTION      There were no vitals filed for this visit.  Subjective Assessment - 01/10/18 0848    Subjective  Pt is doing well, just still has joint aches when she first gets up that get better during the day  She is back to running for exercise     Pertinent History  Bilateral mastectomy for right hormone receptive breast cancer with right SLNB 01/28/17 followed by re-excision and ALND 02/14/17. Finished chemo week of 07/22/17.  Completed chemo 10/03/2017 . completed radiation last part of May     Patient Stated Goals  just improve ROM; play pickleball better     Currently in Pain?  No/denies                       [facility] Adult PT Treatment/Exercise - 01/10/18 0001      Lumbar Exercises: Stretches   Single Knee to Chest Stretch  Right;Left;5 reps    Single Knee to Chest Stretch Limitations  also bringing hip across body     Lower Trunk Rotation  60 seconds    Lower Trunk Rotation Limitations  with arms out wide as tolerated for stretch       Lumbar Exercises: Supine   Other Supine Lumbar Exercises  over purple squish ball with pillow at head with arms open and close, also with hands on dowel for shoulder stretch       Lumbar Exercises: Quadruped   [place]  5 reps  multimodal cues for thoracic mobility    Opposite Arm/Leg Raise  Right arm/Left leg;Left arm/Right leg;10 reps    Other Quadruped Lumbar Exercises  childs pose       Shoulder Exercises: Supine   Other Supine Exercises  supine foam roller scapular retraction and protraction, dowel rod flexion, marching and dead bug       Shoulder Exercises: Standing   Other Standing Exercises  pinky fingers toward wall with slide up the wall into a "V" progress to lift offs       Manual Therapy   Myofascial Release  crosshands across right axilla and chest, but limited by discomfort of the pressure at right upper arm.  Rt. UE myofascial pulling with some concurrent distraction at right axilla, but discomfort  with hand grip on arm for this one. In supine, crosshands from right upper arm to mid-chest.              PT Education - 01/10/18 1244    Education provided  Yes    Education Details  supine stretches over foam roller,        PT Short Term Goals - 07/22/17 1437      PT SHORT TERM GOAL #1   Title  Pt. will report perception of at least 30% decrease in right axillary cording.    Baseline  Maybe 30% better as of 07/22/17    Status  Achieved        PT Long Term Goals - 12/30/17 1024      PT LONG TERM GOAL #1   Title  Pt will play pickleball without limitations in overhead play    Time  3    Period  Weeks    Status  New    Target Date  01/27/18      PT LONG TERM GOAL #2   Title  Pt will improve Rt shoulder AROM to flex: 150,  abd; 155,  ER: 85    Time  3    Period  Weeks    Status  New    Target Date  01/27/18      PT LONG TERM GOAL #3   Title  Pt will report no limitations with reaching overhead     Time  3    Period  Weeks    Status  New    Target Date  01/27/18      PT LONG TERM GOAL #4   Title  Pt will demonstrate 5/5 Rt shoulder strength with no shakiness during holds    Time  3    Period  Weeks    Status  New    Target Date  01/27/18      PT LONG TERM GOAL #5   Title  Pt will improve QDASH to 10% or less    Time  3    Period  Weeks    Status  New    Target Date  01/27/18            Plan - 01/10/18 1245    Clinical Impression Statement  Pt continues with soft tissue tightness that may be associated with radiation. Reinfoced stretches today.  Pt will check to see how many PT visits her insurance has approved as she may want to get some Pilates training from Guido Sander, PT during this episode of care     PT Treatment/Interventions  ADLs/Self Care Home Management;Therapeutic exercise;Patient/family education;Manual techniques;Manual lymph drainage;Scar mobilization;Passive range of motion;Taping    PT Next  Visit Plan  Rt shoulder PROM, myofascial  release, working into scapular strengthening and shoulder strengthening (has strength ABC program already but not doing it)  Lt shoulder needs?  check to see if pt has enough visits avaiable for Pilates and arrange for it as needed        Patient will benefit from skilled therapeutic intervention in order to improve the following deficits and impairments:  Decreased range of motion, Pain, Increased fascial restricitons, Decreased strength  Visit Diagnosis: Stiffness of right shoulder, not elsewhere classified  Disorder of the skin and subcutaneous tissue related to radiation, unspecified  Aftercare following surgery for neoplasm  Abnormal posture  Other symptoms and signs involving the musculoskeletal system     Problem List Patient Active Problem List   Diagnosis Date Noted  . Cancer of central portion of right female breast ([place]) 01/28/2017  . 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, estrogen receptor positive ([place]) 01/08/2017   Donato Heinz. Owens Shark PT  Norwood Levo 01/10/2018, 12:49 PM  [place], [place], [address] Phone: [phone]   Fax:  [phone]  Name: Angelica Floyd MRN: [id number] Date of Birth: 06-May-1966

## 2018-01-15 ENCOUNTER — Encounter: Payer: Self-pay | Admitting: Rehabilitation

## 2018-01-15 ENCOUNTER — Ambulatory Visit: Payer: BLUE CROSS/BLUE SHIELD | Attending: Hematology and Oncology | Admitting: Rehabilitation

## 2018-01-15 DIAGNOSIS — M25611 Stiffness of right shoulder, not elsewhere classified: Secondary | ICD-10-CM | POA: Diagnosis not present

## 2018-01-15 DIAGNOSIS — M6281 Muscle weakness (generalized): Secondary | ICD-10-CM | POA: Diagnosis present

## 2018-01-15 DIAGNOSIS — Z483 Aftercare following surgery for neoplasm: Secondary | ICD-10-CM | POA: Insufficient documentation

## 2018-01-15 DIAGNOSIS — L599 Disorder of the skin and subcutaneous tissue related to radiation, unspecified: Secondary | ICD-10-CM | POA: Insufficient documentation

## 2018-01-15 DIAGNOSIS — R293 Abnormal posture: Secondary | ICD-10-CM | POA: Diagnosis present

## 2018-01-15 DIAGNOSIS — M25612 Stiffness of left shoulder, not elsewhere classified: Secondary | ICD-10-CM | POA: Diagnosis present

## 2018-01-15 NOTE — Therapy (Signed)
[place], [place], [address] Phone: [phone]   Fax:  [phone]  Physical Therapy Treatment  Patient Details  Name: Angelica Floyd MRN: [id number] Date of Birth: 08-31-1965 Referring Provider: [name], PA-C   Encounter Date: 01/15/2018  PT End of Session - 01/15/18 0857    Visit Number  4    Number of Visits  9    Date for PT Re-Evaluation  01/27/18    PT Start Time  0847    PT Stop Time  0928    PT Time Calculation (min)  41 min    Activity Tolerance  Patient tolerated treatment well    Behavior During Therapy  [facility] for tasks assessed/performed       Past Medical History:  Diagnosis Date  . Anxiety   . History of chemotherapy    finished chemo 07/29/2017  . 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, estrogen receptor positive ([place]) 01/08/2017  . Seasonal allergies     Past Surgical History:  Procedure Laterality Date  . ABDOMINAL HYSTERECTOMY     partial  . ADENOIDECTOMY W/ MYRINGOTOMY    . APPENDECTOMY    . AXILLARY LYMPH NODE DISSECTION Right 02/14/2017   Procedure: RIGHT AXILLARY LYMPH NODE DISSECTION ERAS PATHWAY;  Surgeon: Jovita Kussmaul, MD;  Location: [facility];  Service: General;  Laterality: Right;  . CESAREAN SECTION    . MASTECTOMY W/ SENTINEL NODE BIOPSY Bilateral 01/28/2017   RIGHT BREAST BIOPSY  . MASTECTOMY W/ SENTINEL NODE BIOPSY Bilateral 01/28/2017   Procedure: BILATERAL MASTECTOMY WITH RIGHT SENTINEL LYMPH NODE BIOPSY;  Surgeon: Jovita Kussmaul, MD;  Location: [facility];  Service: General;  Laterality: Bilateral;  . PORT-A-CATH REMOVAL Left 08/08/2017   Procedure: REMOVAL PORT-A-CATH;  Surgeon: Jovita Kussmaul, MD;  Location: [place];  Service: General;  Laterality: Left;  . PORTACATH PLACEMENT Left 02/14/2017   Procedure: INSERTION PORT-A-CATH;  Surgeon: Jovita Kussmaul, MD;  Location: [facility];  Service: General;  Laterality: Left;   . RE-EXCISION OF BREAST CANCER,SUPERIOR MARGINS N/A 02/14/2017   Procedure: RE-EXCISION INFERIOR FLAP;  Surgeon: Jovita Kussmaul, MD;  Location: [facility];  Service: General;  Laterality: N/A;  . WISDOM TOOTH EXTRACTION      There were no vitals filed for this visit.  Subjective Assessment - 01/15/18 0848    Subjective  Shoulder is feeling pretty good. Sometimes it aches.  Concerned about losing the stretching part of here.      Pertinent History  Bilateral mastectomy for right hormone receptive breast cancer with right SLNB 01/28/17 followed by re-excision and ALND 02/14/17. Finished chemo week of 07/22/17.  Completed chemo 10/03/2017 . completed radiation last part of May     Currently in Pain?  No/denies                       [facility] Adult PT Treatment/Exercise - 01/15/18 0001      Lumbar Exercises: Quadruped   Opposite Arm/Leg Raise  Right arm/Left leg;Left arm/Right leg;10 reps    Other Quadruped Lumbar Exercises  childs pose       Shoulder Exercises: Supine   Other Supine Exercises  supine foam roller: UE alternating flexion overhead x 10, and together x 10 with yellow band pull out, pectoralis stretch x 46min, bilateral serratus punch 2# x 15,       Manual Therapy   Passive ROM  Rt shoulder flexion,  abdu, ER at various angles to tolerance with and without myofascial release to the chest wall                PT Short Term Goals - 07/22/17 1437      PT SHORT TERM GOAL #1   Title  Pt. will report perception of at least 30% decrease in right axillary cording.    Baseline  Maybe 30% better as of 07/22/17    Status  Achieved        PT Long Term Goals - 12/30/17 1024      PT LONG TERM GOAL #1   Title  Pt will play pickleball without limitations in overhead play    Time  3    Period  Weeks    Status  New    Target Date  01/27/18      PT LONG TERM GOAL #2   Title  Pt will improve Rt shoulder AROM to flex: 150,  abd; 155,  ER: 85    Time  3    Period  Weeks     Status  New    Target Date  01/27/18      PT LONG TERM GOAL #3   Title  Pt will report no limitations with reaching overhead     Time  3    Period  Weeks    Status  New    Target Date  01/27/18      PT LONG TERM GOAL #4   Title  Pt will demonstrate 5/5 Rt shoulder strength with no shakiness during holds    Time  3    Period  Weeks    Status  New    Target Date  01/27/18      PT LONG TERM GOAL #5   Title  Pt will improve QDASH to 10% or less    Time  3    Period  Weeks    Status  New    Target Date  01/27/18            Plan - 01/15/18 0857    Clinical Impression Statement  Pt wanting to continue with cancer rehab stretching for the next 2 weeks and then incorporate 1x at pilates and 1x here for a few weeks to make sure she doesn't lose ROM.  Overall doing well      Clinical Impairments Affecting Rehab Potential  finished chemo 07/25/17; radiation from 08/30/17-10/02/17    PT Frequency  2x / week    PT Duration  3 weeks    PT Treatment/Interventions  ADLs/Self Care Home Management;Therapeutic exercise;Patient/family education;Manual techniques;Manual lymph drainage;Scar mobilization;Passive range of motion;Taping    PT Next Visit Plan  Rt shoulder ROM and myofascial release x 2 more weeks and then starting pilates x 1 visit and here x 1 visit per week.      PT Home Exercise Plan  given medbridge handout 12/30/17,  Access Code: [email or web address]        Patient will benefit from skilled therapeutic intervention in order to improve the following deficits and impairments:  Decreased range of motion, Pain, Increased fascial restricitons, Decreased strength  Visit Diagnosis: Stiffness of right shoulder, not elsewhere classified  Aftercare following surgery for neoplasm  Abnormal posture     Problem List Patient Active Problem List   Diagnosis Date Noted  . Cancer of central portion of right female breast ([place]) 01/28/2017  . Malignant neoplasm of lower-outer quadrant of right  breast of female,  estrogen receptor positive ([place]) 01/08/2017    Shan Levans, PT 01/15/2018, 9:30 AM  [place], [place], [address] Phone: [phone]   Fax:  [phone]  Name: Angelica Floyd MRN: [id number] Date of Birth: 1965/05/26

## 2018-01-17 ENCOUNTER — Ambulatory Visit: Payer: BLUE CROSS/BLUE SHIELD | Admitting: Rehabilitation

## 2018-01-17 ENCOUNTER — Encounter: Payer: Self-pay | Admitting: Rehabilitation

## 2018-01-17 DIAGNOSIS — M25611 Stiffness of right shoulder, not elsewhere classified: Secondary | ICD-10-CM

## 2018-01-17 DIAGNOSIS — R293 Abnormal posture: Secondary | ICD-10-CM

## 2018-01-17 DIAGNOSIS — Z483 Aftercare following surgery for neoplasm: Secondary | ICD-10-CM

## 2018-01-17 NOTE — Therapy (Signed)
[place], [place], [address] Phone: [phone]   Fax:  [phone]  Physical Therapy Treatment  Patient Details  Name: Angelica Floyd MRN: [id number] Date of Birth: 1965/10/19 Referring Provider: [name], PA-C   Encounter Date: 01/17/2018  PT End of Session - 01/17/18 0851    Visit Number  5    Number of Visits  9    Date for PT Re-Evaluation  01/27/18    PT Start Time  0846    PT Stop Time  0925    PT Time Calculation (min)  39 min    Activity Tolerance  Patient tolerated treatment well    Behavior During Therapy  [facility] for tasks assessed/performed       Past Medical History:  Diagnosis Date  . Anxiety   . History of chemotherapy    finished chemo 07/29/2017  . 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, estrogen receptor positive ([place]) 01/08/2017  . Seasonal allergies     Past Surgical History:  Procedure Laterality Date  . ABDOMINAL HYSTERECTOMY     partial  . ADENOIDECTOMY W/ MYRINGOTOMY    . APPENDECTOMY    . AXILLARY LYMPH NODE DISSECTION Right 02/14/2017   Procedure: RIGHT AXILLARY LYMPH NODE DISSECTION ERAS PATHWAY;  Surgeon: Jovita Kussmaul, MD;  Location: [facility];  Service: General;  Laterality: Right;  . CESAREAN SECTION    . MASTECTOMY W/ SENTINEL NODE BIOPSY Bilateral 01/28/2017   RIGHT BREAST BIOPSY  . MASTECTOMY W/ SENTINEL NODE BIOPSY Bilateral 01/28/2017   Procedure: BILATERAL MASTECTOMY WITH RIGHT SENTINEL LYMPH NODE BIOPSY;  Surgeon: Jovita Kussmaul, MD;  Location: [facility];  Service: General;  Laterality: Bilateral;  . PORT-A-CATH REMOVAL Left 08/08/2017   Procedure: REMOVAL PORT-A-CATH;  Surgeon: Jovita Kussmaul, MD;  Location: [facility];  Service: General;  Laterality: Left;  . PORTACATH PLACEMENT Left 02/14/2017   Procedure: INSERTION PORT-A-CATH;  Surgeon: Jovita Kussmaul, MD;  Location: [facility];  Service: General;  Laterality: Left;   . RE-EXCISION OF BREAST CANCER,SUPERIOR MARGINS N/A 02/14/2017   Procedure: RE-EXCISION INFERIOR FLAP;  Surgeon: Jovita Kussmaul, MD;  Location: [facility];  Service: General;  Laterality: N/A;  . WISDOM TOOTH EXTRACTION      There were no vitals filed for this visit.  Subjective Assessment - 01/17/18 0846    Subjective  Good today. No complaints     Pertinent History  Bilateral mastectomy for right hormone receptive breast cancer with right SLNB 01/28/17 followed by re-excision and ALND 02/14/17. Finished chemo week of 07/22/17.  Completed chemo 10/03/2017 . completed radiation last part of May     Currently in Pain?  No/denies         [facility] PT Assessment - 01/17/18 0001      Strength   Right Shoulder Flexion  4/5    Right Shoulder ABduction  4+/5    Right Shoulder Internal Rotation  5/5    Right Shoulder External Rotation  5/5    Left Shoulder Flexion  4+/5    Left Shoulder ABduction  5/5                   [facility] Adult PT Treatment/Exercise - 01/17/18 0001      Shoulder Exercises: Supine   Other Supine Exercises  supine foam roller: UE alternating flexion overhead x 10 2# , and together x 10 with yellow band pull out, pectoralis stretch x 38min,  bilateral serratus punch 2# x 15,       Shoulder Exercises: Standing   Other Standing Exercises  pinky fingers toward wall with slide up the wall into a "V" progress to lift offs, back to wall overhead press 2# , yellow band Right D2 extension movement mimicking pickleball motion x 10      Shoulder Exercises: Stretch   Other Shoulder Stretches  on knees with elbows on chair elbow flexion and extension x 10 for thoracic mobility       Manual Therapy   Passive ROM  Rt shoulder flexion, abdu, ER at various angles to tolerance with and without myofascial release to the chest wall                PT Short Term Goals - 07/22/17 1437      PT SHORT TERM GOAL #1   Title  Pt. will report perception of at least 30% decrease in  right axillary cording.    Baseline  Maybe 30% better as of 07/22/17    Status  Achieved        PT Long Term Goals - 01/17/18 0852      PT LONG TERM GOAL #1   Title  Pt will play pickleball without limitations in overhead play    Baseline  feels 80% as of 9/6    Status  On-going      PT LONG TERM GOAL #2   Title  Pt will improve Rt shoulder AROM to flex: 150,  abd; 155,  ER: 85    Status  On-going      PT LONG TERM GOAL #3   Title  Pt will report no limitations with reaching overhead     Status  On-going      PT LONG TERM GOAL #4   Title  Pt will demonstrate 5/5 Rt shoulder strength with no shakiness during holds    Baseline  flex:        abd:         ER:        IR:               Plan - 01/17/18 0851    Clinical Impression Statement  Pt tolerated all well.  Improved initial mobility in the shoulder with overhead flexion and pt feeling less pull into the chest.  Will continue with current POC to progress towards goals and most likely switch to pilates with Delsa Sale after next week with follow-up as needed.     Clinical Impairments Affecting Rehab Potential  finished chemo 07/25/17; radiation from 08/30/17-10/02/17    PT Frequency  2x / week    PT Duration  3 weeks    PT Treatment/Interventions  ADLs/Self Care Home Management;Therapeutic exercise;Patient/family education;Manual techniques;Manual lymph drainage;Scar mobilization;Passive range of motion;Taping    PT Next Visit Plan  Rt shoulder ROM and myofascial release x 2 more weeks and then starting pilates x 1 visit and here x 1 visit per week.      PT Home Exercise Plan  given medbridge handout 12/30/17,  Access Code: [email or web address]     Consulted and Agree with Plan of Care  Patient       Patient will benefit from skilled therapeutic intervention in order to improve the following deficits and impairments:  Decreased range of motion, Pain, Increased fascial restricitons, Decreased strength  Visit Diagnosis: Stiffness of right  shoulder, not elsewhere classified  Aftercare following surgery for neoplasm  Abnormal posture  Problem List Patient Active Problem List   Diagnosis Date Noted  . Cancer of central portion of right female breast ([place]) 01/28/2017  . 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, estrogen receptor positive ([place]) 01/08/2017    Shan Levans, PT 01/17/2018, 9:27 AM  [place], [place], [address] Phone: [phone]   Fax:  [phone]  Name: MADISUN HARGROVE MRN: [id number] Date of Birth: 10/15/1965

## 2018-01-20 ENCOUNTER — Ambulatory Visit: Payer: BLUE CROSS/BLUE SHIELD | Admitting: Rehabilitation

## 2018-01-20 ENCOUNTER — Encounter: Payer: Self-pay | Admitting: Rehabilitation

## 2018-01-20 DIAGNOSIS — Z483 Aftercare following surgery for neoplasm: Secondary | ICD-10-CM

## 2018-01-20 DIAGNOSIS — R293 Abnormal posture: Secondary | ICD-10-CM

## 2018-01-20 DIAGNOSIS — M25611 Stiffness of right shoulder, not elsewhere classified: Secondary | ICD-10-CM

## 2018-01-20 DIAGNOSIS — M25612 Stiffness of left shoulder, not elsewhere classified: Secondary | ICD-10-CM

## 2018-01-20 DIAGNOSIS — L599 Disorder of the skin and subcutaneous tissue related to radiation, unspecified: Secondary | ICD-10-CM

## 2018-01-20 NOTE — Therapy (Signed)
[place], [place], [address] Phone: [phone]   Fax:  [phone]  Physical Therapy Treatment  Patient Details  Name: Angelica Floyd MRN: [id number] Date of Birth: 10-22-1965 Referring Provider: [name], PA-C   Encounter Date: 01/20/2018  PT End of Session - 01/20/18 0815    Visit Number  6    Number of Visits  9    Date for PT Re-Evaluation  01/27/18    PT Start Time  0809   arriving late   PT Stop Time  0842    PT Time Calculation (min)  33 min    Activity Tolerance  Patient tolerated treatment well    Behavior During Therapy  [facility] for tasks assessed/performed       Past Medical History:  Diagnosis Date  . Anxiety   . History of chemotherapy    finished chemo 07/29/2017  . 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, estrogen receptor positive ([place]) 01/08/2017  . Seasonal allergies     Past Surgical History:  Procedure Laterality Date  . ABDOMINAL HYSTERECTOMY     partial  . ADENOIDECTOMY W/ MYRINGOTOMY    . APPENDECTOMY    . AXILLARY LYMPH NODE DISSECTION Right 02/14/2017   Procedure: RIGHT AXILLARY LYMPH NODE DISSECTION ERAS PATHWAY;  Surgeon: Jovita Kussmaul, MD;  Location: [place];  Service: General;  Laterality: Right;  . CESAREAN SECTION    . MASTECTOMY W/ SENTINEL NODE BIOPSY Bilateral 01/28/2017   RIGHT BREAST BIOPSY  . MASTECTOMY W/ SENTINEL NODE BIOPSY Bilateral 01/28/2017   Procedure: BILATERAL MASTECTOMY WITH RIGHT SENTINEL LYMPH NODE BIOPSY;  Surgeon: Jovita Kussmaul, MD;  Location: [place];  Service: General;  Laterality: Bilateral;  . PORT-A-CATH REMOVAL Left 08/08/2017   Procedure: REMOVAL PORT-A-CATH;  Surgeon: Jovita Kussmaul, MD;  Location: [place];  Service: General;  Laterality: Left;  . PORTACATH PLACEMENT Left 02/14/2017   Procedure: INSERTION PORT-A-CATH;  Surgeon: Jovita Kussmaul, MD;  Location: [place];  Service: General;   Laterality: Left;  . RE-EXCISION OF BREAST CANCER,SUPERIOR MARGINS N/A 02/14/2017   Procedure: RE-EXCISION INFERIOR FLAP;  Surgeon: Jovita Kussmaul, MD;  Location: [place];  Service: General;  Laterality: N/A;  . WISDOM TOOTH EXTRACTION      There were no vitals filed for this visit.  Subjective Assessment - 01/20/18 0809    Subjective  Good today. No complaints     Pertinent History  Bilateral mastectomy for right hormone receptive breast cancer with right SLNB 01/28/17 followed by re-excision and ALND 02/14/17. Finished chemo week of 07/22/17.  Completed chemo 10/03/2017 . completed radiation last part of May     Patient Stated Goals  just improve ROM; play pickleball better     Currently in Pain?  No/denies                       [facility] Adult PT Treatment/Exercise - 01/20/18 0001      Shoulder Exercises: Supine   Other Supine Exercises  supine foam roller: UE alternating flexion overhead x 10 2# , and together x 10 with yellow band pull out, pectoralis stretch x 54min with small towel under Rt elbow, bilateral serratus punch 2# x 15,     Other Supine Exercises  on foam roll scapular series x 10 each red band diagonals bilateral       Shoulder Exercises: Standing   Other Standing Exercises  high  kneel Rt leg up, Lt knee down, thoracic rotation Rt UE x 10 and switching to the Lt arm x 10, then high kneel with the wall side leg up attempting half clock motion x 10 bil       Manual Therapy   Passive ROM  Rt shoulder flexion, abdu, ER at various angles to tolerance with and without myofascial release to the chest wall                PT Short Term Goals - 07/22/17 1437      PT SHORT TERM GOAL #1   Title  Pt. will report perception of at least 30% decrease in right axillary cording.    Baseline  Maybe 30% better as of 07/22/17    Status  Achieved        PT Long Term Goals - 01/17/18 0852      PT LONG TERM GOAL #1   Title  Pt will play pickleball without limitations  in overhead play    Baseline  feels 80% as of 9/6    Status  On-going      PT LONG TERM GOAL #2   Title  Pt will improve Rt shoulder AROM to flex: 150,  abd; 155,  ER: 85    Status  On-going      PT LONG TERM GOAL #3   Title  Pt will report no limitations with reaching overhead     Status  On-going      PT LONG TERM GOAL #4   Title  Pt will demonstrate 5/5 Rt shoulder strength with no shakiness during holds    Baseline  flex:        abd:         ER:        IR:               Plan - 01/20/18 0841    Clinical Impression Statement  Continues to demonstrate improving shoulder ROM.  Improving skin mobility over the chest wall.  attempted high kneeling thoracic mobility with some difficulty with this today but only "a good stretch"    Clinical Impairments Affecting Rehab Potential  finished chemo 07/25/17; radiation from 08/30/17-10/02/17    PT Frequency  2x / week    PT Duration  3 weeks    PT Treatment/Interventions  ADLs/Self Care Home Management;Therapeutic exercise;Patient/family education;Manual techniques;Manual lymph drainage;Scar mobilization;Passive range of motion;Taping    PT Next Visit Plan  Rt shoulder ROM and myofascial release x 2 this week and then starting pilates x 1 visit and here x 1 visit per week?     PT Home Exercise Plan  given medbridge handout 12/30/17,  Access Code: [email or web address]        Patient will benefit from skilled therapeutic intervention in order to improve the following deficits and impairments:  Decreased range of motion, Pain, Increased fascial restricitons, Decreased strength  Visit Diagnosis: Stiffness of right shoulder, not elsewhere classified  Aftercare following surgery for neoplasm  Abnormal posture  Disorder of the skin and subcutaneous tissue related to radiation, unspecified  Stiffness of left shoulder, not elsewhere classified     Problem List Patient Active Problem List   Diagnosis Date Noted  . Cancer of central portion of right  female breast ([place]) 01/28/2017  . 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, estrogen receptor positive ([place]) 01/08/2017    Shan Levans, PT 01/20/2018, 8:43 AM  [place]  [place], [place], [address] Phone: [phone]   Fax:  [phone]  Name: Angelica Floyd MRN: [id number] Date of Birth: 09-Mar-1966

## 2018-01-23 ENCOUNTER — Ambulatory Visit: Payer: BLUE CROSS/BLUE SHIELD

## 2018-01-23 DIAGNOSIS — M25611 Stiffness of right shoulder, not elsewhere classified: Secondary | ICD-10-CM | POA: Diagnosis not present

## 2018-01-23 DIAGNOSIS — R293 Abnormal posture: Secondary | ICD-10-CM

## 2018-01-23 DIAGNOSIS — L599 Disorder of the skin and subcutaneous tissue related to radiation, unspecified: Secondary | ICD-10-CM

## 2018-01-23 DIAGNOSIS — M25612 Stiffness of left shoulder, not elsewhere classified: Secondary | ICD-10-CM

## 2018-01-23 DIAGNOSIS — Z483 Aftercare following surgery for neoplasm: Secondary | ICD-10-CM

## 2018-01-23 NOTE — Therapy (Signed)
[place], [place], [address] Phone: [phone]   Fax:  [phone]  Physical Therapy Treatment  Patient Details  Name: TONESHA TSOU MRN: [id number] Date of Birth: 1965/07/06 Referring Provider: [name], PA-C   Encounter Date: 01/23/2018  PT End of Session - 01/23/18 0854    Visit Number  7    Number of Visits  9    Date for PT Re-Evaluation  01/27/18    PT Start Time  0845    PT Stop Time  0931    PT Time Calculation (min)  46 min    Activity Tolerance  Patient tolerated treatment well    Behavior During Therapy  [facility] for tasks assessed/performed       Past Medical History:  Diagnosis Date  . Anxiety   . History of chemotherapy    finished chemo 07/29/2017  . 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, estrogen receptor positive ([place]) 01/08/2017  . Seasonal allergies     Past Surgical History:  Procedure Laterality Date  . ABDOMINAL HYSTERECTOMY     partial  . ADENOIDECTOMY W/ MYRINGOTOMY    . APPENDECTOMY    . AXILLARY LYMPH NODE DISSECTION Right 02/14/2017   Procedure: RIGHT AXILLARY LYMPH NODE DISSECTION ERAS PATHWAY;  Surgeon: Jovita Kussmaul, MD;  Location: [place];  Service: General;  Laterality: Right;  . CESAREAN SECTION    . MASTECTOMY W/ SENTINEL NODE BIOPSY Bilateral 01/28/2017   RIGHT BREAST BIOPSY  . MASTECTOMY W/ SENTINEL NODE BIOPSY Bilateral 01/28/2017   Procedure: BILATERAL MASTECTOMY WITH RIGHT SENTINEL LYMPH NODE BIOPSY;  Surgeon: Jovita Kussmaul, MD;  Location: [place];  Service: General;  Laterality: Bilateral;  . PORT-A-CATH REMOVAL Left 08/08/2017   Procedure: REMOVAL PORT-A-CATH;  Surgeon: Jovita Kussmaul, MD;  Location: [place];  Service: General;  Laterality: Left;  . PORTACATH PLACEMENT Left 02/14/2017   Procedure: INSERTION PORT-A-CATH;  Surgeon: Jovita Kussmaul, MD;  Location: [place];  Service: General;  Laterality: Left;   . RE-EXCISION OF BREAST CANCER,SUPERIOR MARGINS N/A 02/14/2017   Procedure: RE-EXCISION INFERIOR FLAP;  Surgeon: Jovita Kussmaul, MD;  Location: [place];  Service: General;  Laterality: N/A;  . WISDOM TOOTH EXTRACTION      There were no vitals filed for this visit.  Subjective Assessment - 01/23/18 0846    Subjective  I can tell my Rt shoulder and chest is getting better, more so after the last few weeks. I had my flu shot Tuesday so I didn't do any stretches yesterday bc it made me so tired.     Pertinent History  Bilateral mastectomy for right hormone receptive breast cancer with right SLNB 01/28/17 followed by re-excision and ALND 02/14/17. Finished chemo week of 07/22/17.  Completed chemo 10/03/2017 . completed radiation last part of May     Patient Stated Goals  just improve ROM; play pickleball better     Currently in Pain?  No/denies                       [facility] Adult PT Treatment/Exercise - 01/23/18 0001      Shoulder Exercises: Supine   Other Supine Exercises  supine foam roller: UE alternating flexion overhead x 10 2# , and together x 10 with yellow band pull out, pectoralis stretch x 46min with small towel under Rt elbow, bilateral serratus punch 2# x 15,     Other Supine  Exercises  on foam roll 2# bil UE diagonols bilateral ; then with red theraband narrow and wide grip flexion and er x10 each      Shoulder Exercises: Standing   Other Standing Exercises  --      Manual Therapy   Passive ROM  Rt shoulder flexion, abduction, er at various angles to tolerance with and without myofascial release to the chest wall                PT Short Term Goals - 07/22/17 1437      PT SHORT TERM GOAL #1   Title  Pt. will report perception of at least 30% decrease in right axillary cording.    Baseline  Maybe 30% better as of 07/22/17    Status  Achieved        PT Long Term Goals - 01/23/18 0934      PT LONG TERM GOAL #1   Title  Pt will play pickleball without  limitations in overhead play    Baseline  feels 80% as of 9/6    Status  On-going      PT LONG TERM GOAL #2   Title  Pt will improve Rt shoulder AROM to flex: 150,  abd; 155,  ER: 85    Status  On-going      PT LONG TERM GOAL #3   Title  Pt will report no limitations with reaching overhead     Status  On-going      PT LONG TERM GOAL #4   Title  Pt will demonstrate 5/5 Rt shoulder strength with no shakiness during holds    Status  On-going      PT LONG TERM GOAL #5   Title  Pt will improve QDASH to 10% or less    Status  On-going            Plan - 01/23/18 6812    Clinical Impression Statement  Continued with postural stability exercisees on foam roll as pt reported this feeling very beneficial at last visit. Progressed some of exercises to 2# weights instead of theraband and pt reported feeling increased stretch. Also continued with manual therapy focusing on progression of end ROM.    Rehab Potential  Excellent    Clinical Impairments Affecting Rehab Potential  finished chemo 07/25/17; radiation from 08/30/17-10/02/17    PT Frequency  2x / week    PT Duration  3 weeks    PT Treatment/Interventions  ADLs/Self Care Home Management;Therapeutic exercise;Patient/family education;Manual techniques;Manual lymph drainage;Scar mobilization;Passive range of motion;Taping    PT Next Visit Plan  Rt shoulder ROM and myofascial release x 2 this week and then starting pilates x 1 visit and here x 1 visit per week? ; for now though pt considering just pilates and will call us if wanting to resume PT for ROM with [facility]    Consulted and Agree with Plan of Care  Patient       Patient will benefit from skilled therapeutic intervention in order to improve the following deficits and impairments:  Decreased range of motion, Pain, Increased fascial restricitons, Decreased strength  Visit Diagnosis: Stiffness of right shoulder, not elsewhere classified  Aftercare following surgery for  neoplasm  Abnormal posture  Disorder of the skin and subcutaneous tissue related to radiation, unspecified  Stiffness of left shoulder, not elsewhere classified     Problem List Patient Active Problem List   Diagnosis Date Noted  . Cancer of central portion of  right female breast ([place]) 01/28/2017  . 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, estrogen receptor positive ([place]) 01/08/2017    Otelia Limes, PTA 01/23/2018, 12:25 PM  [facility] [place], [place], [place], [address] Phone: [phone]   Fax:  [phone]  Name: ASTRID VIDES MRN: [id number] Date of Birth: Feb 08, 1966

## 2018-01-28 ENCOUNTER — Encounter: Payer: Self-pay | Admitting: Physical Therapy

## 2018-01-28 ENCOUNTER — Ambulatory Visit: Payer: BLUE CROSS/BLUE SHIELD | Admitting: Physical Therapy

## 2018-01-28 DIAGNOSIS — M25612 Stiffness of left shoulder, not elsewhere classified: Secondary | ICD-10-CM

## 2018-01-28 DIAGNOSIS — M6281 Muscle weakness (generalized): Secondary | ICD-10-CM

## 2018-01-28 DIAGNOSIS — M25611 Stiffness of right shoulder, not elsewhere classified: Secondary | ICD-10-CM

## 2018-01-28 DIAGNOSIS — R293 Abnormal posture: Secondary | ICD-10-CM

## 2018-01-28 NOTE — Therapy (Signed)
[place], [place], [address] Phone: [phone]   Fax:  [phone]  Physical Therapy Treatment/Renewal  Patient Details  Name: RAYNI NEMITZ MRN: [id number] Date of Birth: 1965-09-30 Referring Provider: Dr. Lindi Adie   Encounter Date: 01/28/2018  PT End of Session - 01/28/18 0910    Visit Number  8    Number of Visits  13    Date for PT Re-Evaluation  03/11/18    PT Start Time  0847    PT Stop Time  0930    PT Time Calculation (min)  43 min    Activity Tolerance  Patient tolerated treatment well    Behavior During Therapy  [facility] for tasks assessed/performed       Past Medical History:  Diagnosis Date  . Anxiety   . History of chemotherapy    finished chemo 07/29/2017  . 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, estrogen receptor positive ([place]) 01/08/2017  . Seasonal allergies     Past Surgical History:  Procedure Laterality Date  . ABDOMINAL HYSTERECTOMY     partial  . ADENOIDECTOMY W/ MYRINGOTOMY    . APPENDECTOMY    . AXILLARY LYMPH NODE DISSECTION Right 02/14/2017   Procedure: RIGHT AXILLARY LYMPH NODE DISSECTION ERAS PATHWAY;  Surgeon: Jovita Kussmaul, MD;  Location: [place];  Service: General;  Laterality: Right;  . CESAREAN SECTION    . MASTECTOMY W/ SENTINEL NODE BIOPSY Bilateral 01/28/2017   RIGHT BREAST BIOPSY  . MASTECTOMY W/ SENTINEL NODE BIOPSY Bilateral 01/28/2017   Procedure: BILATERAL MASTECTOMY WITH RIGHT SENTINEL LYMPH NODE BIOPSY;  Surgeon: Jovita Kussmaul, MD;  Location: [place];  Service: General;  Laterality: Bilateral;  . PORT-A-CATH REMOVAL Left 08/08/2017   Procedure: REMOVAL PORT-A-CATH;  Surgeon: Jovita Kussmaul, MD;  Location: [place];  Service: General;  Laterality: Left;  . PORTACATH PLACEMENT Left 02/14/2017   Procedure: INSERTION PORT-A-CATH;  Surgeon: Jovita Kussmaul, MD;  Location: [place];  Service: General;  Laterality: Left;  .  RE-EXCISION OF BREAST CANCER,SUPERIOR MARGINS N/A 02/14/2017   Procedure: RE-EXCISION INFERIOR FLAP;  Surgeon: Jovita Kussmaul, MD;  Location: [place];  Service: General;  Laterality: N/A;  . WISDOM TOOTH EXTRACTION      There were no vitals filed for this visit.  Subjective Assessment - 01/28/18 0852    Subjective  I need to continue so I dont slack off and maintain ROM.  Hard to reach overhead and sometimes back.  No pain really.  My shoulder "cracks" alot.  Has taken Pilates before and liked it., used to run and play tennis, pickleball.     Pertinent History  Bilateral mastectomy for right hormone receptive breast cancer with right SLNB 01/28/17 followed by re-excision and ALND 02/14/17. Finished chemo week of 07/22/17.  Completed chemo 10/03/2017 . completed radiation last part of May     Currently in Pain?  No/denies         [facility] PT Assessment - 01/28/18 0001      Assessment   Medical Diagnosis  R shoulder dysfunction    Referring Provider  Dr. 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     Leisure  running, pickleball       Cognition   Overall Cognitive Status  Within Functional Limits for tasks assessed      Sensation   Light Touch  Appears Intact      AROM   Right Shoulder Flexion  138 Degrees    Right Shoulder ABduction  135 Degrees    Right Shoulder Internal Rotation  70 Degrees   FR to T5   Right Shoulder External Rotation  80 Degrees   Fr to T2    Left Shoulder Flexion  148 Degrees    Left Shoulder ABduction  150 Degrees    Left Shoulder Internal Rotation  90 Degrees   FR to  T5   Left Shoulder External Rotation  80 Degrees   FR to T2     PROM   Overall PROM Comments  Rt UE tight, burning end range flexion and abduction, ER       Strength   Right Shoulder Flexion  4+/5    Right Shoulder ABduction  4/5    Right Shoulder Internal Rotation  5/5    Right Shoulder External Rotation  5/5    Left Shoulder Flexion  4+/5    Left Shoulder ABduction  4+/5    Left Shoulder Internal Rotation  5/5    Left Shoulder External Rotation  5/5      Palpation   Palpation comment  crepitus with exercises         Pilates Reformer used for LE/core strength, postural strength, lumbopelvic disassociation and core control.  Exercises included:  Supine Arm work 1 [employer] 3 x 10: Parallel , V and T, min cues for scapular depression , maintaining neutral pelvis    Long box Prone 1 Red double arm overhead press, cues for lower trap activation  1 blue for single arm, tactile cues   Kneeling Arms 1 blue diagonal pull x 8        [facility] Adult PT Treatment/Exercise - 01/28/18 0001      Pilates   Pilates Reformer  see note              PT Education - 01/28/18 1039    Education provided  Yes    Education Details  Pilates , scapular neutral, POC, local resources for Pilates post PT     Person(s) Educated  Patient    Methods  Explanation;Verbal cues;Tactile cues;Demonstration    Comprehension  Verbalized understanding;Tactile cues required       PT Short Term Goals - 01/28/18 1040      PT SHORT TERM GOAL #1   Title  Pt. will report perception of at least 30% decrease in right axillary cording.    Status  Achieved        PT Long Term Goals - 01/28/18 0859      PT LONG TERM GOAL #1   Title  Pt will play pickleball without limitations in overhead play    Baseline  plays with family, 80% of max ability     Status  On-going      PT LONG TERM GOAL #2   Title  Pt will improve Rt shoulder AROM to that of the L UE with min stretch  Baseline  138 dg flexion ,  150 deg abd , mod stretch, burning     Status  On-going      PT LONG TERM GOAL #3   Title  Pt will report no limitations with reaching overhead and back for home tasks     Status  On-going      PT LONG TERM GOAL #4   Title  Pt will demonstrate 5/5 Rt shoulder strength to maximize UE function.     Status  On-going      PT LONG TERM GOAL #5   Title  Pt will demo good sitting posture without cues and for reversal of daily work at the computer     Time  6    Period  Weeks    Status  New    Target Date  03/11/18            Plan - 01/28/18 1043    Clinical Impression Statement  Patient presents for re-evaluation of Rt shoulder dysfunction as a continuation of PT from the [place].  She continues to lack full overhead reach .  She had difficulty today with periscapular stability and endurance exercises for postural endurance.  She had no pain during exercises.  She would like to get involved in Pilates post PT for long term maintenance of posture and UE function.     Rehab Potential  Excellent    Clinical Impairments Affecting Rehab Potential  finished chemo 07/25/17; radiation from 08/30/17-10/02/17    PT Frequency  1x / week    PT Duration  6 weeks    PT Treatment/Interventions  ADLs/Self Care Home Management;Therapeutic exercise;Patient/family education;Manual techniques;Manual lymph drainage;Scar mobilization;Passive range of motion;Taping    PT Next Visit Plan  Cont Reformer/Tower focus on scap stabilty and gentle stretching.  Try some foam roller on tower.     PT Home Exercise Plan  has from [facility]    Consulted and Agree with Plan of Care  Patient       Patient will benefit from skilled therapeutic intervention in order to improve the following deficits and impairments:  Decreased range of motion, Pain, Increased fascial restricitons, Decreased strength, Postural dysfunction, Impaired UE functional use, Impaired flexibility  Visit Diagnosis: Stiffness of right  shoulder, not elsewhere classified  Abnormal posture  Stiffness of left shoulder, not elsewhere classified  Muscle weakness (generalized)     Problem List Patient Active Problem List   Diagnosis Date Noted  . Cancer of central portion of right female breast ([place]) 01/28/2017  . 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, estrogen receptor positive ([place]) 01/08/2017    Yarelly Kuba 01/28/2018, 10:56 AM  [facility] [address] [place], [place], [address] Phone: [phone]   Fax:  [phone]  Name: LOUAN BASE MRN: [id number] Date of Birth: 07-Mar-1966   Raeford Razor, PT 01/28/18 10:56 AM Phone: [phone] Fax: [phone]

## 2018-02-05 ENCOUNTER — Ambulatory Visit: Payer: BLUE CROSS/BLUE SHIELD | Admitting: Physical Therapy

## 2018-02-05 ENCOUNTER — Encounter: Payer: Self-pay | Admitting: Physical Therapy

## 2018-02-05 DIAGNOSIS — M6281 Muscle weakness (generalized): Secondary | ICD-10-CM

## 2018-02-05 DIAGNOSIS — M25611 Stiffness of right shoulder, not elsewhere classified: Secondary | ICD-10-CM | POA: Diagnosis not present

## 2018-02-05 DIAGNOSIS — M25612 Stiffness of left shoulder, not elsewhere classified: Secondary | ICD-10-CM

## 2018-02-05 DIAGNOSIS — R293 Abnormal posture: Secondary | ICD-10-CM

## 2018-02-05 NOTE — Therapy (Signed)
[place], [place], [address] Phone: [phone]   Fax:  [phone]  Physical Therapy Treatment  Patient Details  Name: Angelica Floyd MRN: [id number] Date of Birth: 17-Nov-1965 Referring Provider: Dr. Lindi Adie   Encounter Date: 02/05/2018  PT End of Session - 02/05/18 1020    Visit Number  9    Number of Visits  13    Date for PT Re-Evaluation  03/11/18    PT Start Time  0802    PT Stop Time  0845    PT Time Calculation (min)  43 min    Activity Tolerance  Patient tolerated treatment well    Behavior During Therapy  [facility] for tasks assessed/performed       Past Medical History:  Diagnosis Date  . Anxiety   . History of chemotherapy    finished chemo 07/29/2017  . 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, estrogen receptor positive ([place]) 01/08/2017  . Seasonal allergies     Past Surgical History:  Procedure Laterality Date  . ABDOMINAL HYSTERECTOMY     partial  . ADENOIDECTOMY W/ MYRINGOTOMY    . APPENDECTOMY    . AXILLARY LYMPH NODE DISSECTION Right 02/14/2017   Procedure: RIGHT AXILLARY LYMPH NODE DISSECTION ERAS PATHWAY;  Surgeon: Jovita Kussmaul, MD;  Location: [place];  Service: General;  Laterality: Right;  . CESAREAN SECTION    . MASTECTOMY W/ SENTINEL NODE BIOPSY Bilateral 01/28/2017   RIGHT BREAST BIOPSY  . MASTECTOMY W/ SENTINEL NODE BIOPSY Bilateral 01/28/2017   Procedure: BILATERAL MASTECTOMY WITH RIGHT SENTINEL LYMPH NODE BIOPSY;  Surgeon: Jovita Kussmaul, MD;  Location: [place];  Service: General;  Laterality: Bilateral;  . PORT-A-CATH REMOVAL Left 08/08/2017   Procedure: REMOVAL PORT-A-CATH;  Surgeon: Jovita Kussmaul, MD;  Location: [place];  Service: General;  Laterality: Left;  . PORTACATH PLACEMENT Left 02/14/2017   Procedure: INSERTION PORT-A-CATH;  Surgeon: Jovita Kussmaul, MD;  Location: [place];  Service: General;  Laterality: Left;  .  RE-EXCISION OF BREAST CANCER,SUPERIOR MARGINS N/A 02/14/2017   Procedure: RE-EXCISION INFERIOR FLAP;  Surgeon: Jovita Kussmaul, MD;  Location: [place];  Service: General;  Laterality: N/A;  . WISDOM TOOTH EXTRACTION      There were no vitals filed for this visit.  Subjective Assessment - 02/05/18 0806    Subjective  I have been feeling a tightness in my Rt arm (mediall) with child pose -like stretching. May make an appt with [name] to check it out.     Currently in Pain?  No/denies         Pilates Tower for LE/Core strength, postural strength, lumbopelvic disassociation and core control.  Exercises included:   Arm [place] on foam  UE extension add opposite leg  (dead bug variation)  Used slastix for resistance   Roll down  Yellow x 8 min cues for acticulation   Scapular glides  1 Red spring  Pull down 2 reds for lower trap activation (single arm) Press up single arm  1 Red for LUE, 1 blue for Rt UE   Cat Kneeling/Seated for stretching, added rotation for each side        [facility] Adult PT Treatment/Exercise - 02/05/18 0001      Pilates   Pilates Tower  see note     Other Pilates  stabilization on foam roller: unilateral arm movement , lat pull over 3 lbs x 10  PT Short Term Goals - 01/28/18 1040      PT SHORT TERM GOAL #1   Title  Pt. will report perception of at least 30% decrease in right axillary cording.    Status  Achieved        PT Long Term Goals - 01/28/18 0859      PT LONG TERM GOAL #1   Title  Pt will play pickleball without limitations in overhead play    Baseline  plays with family, 80% of max ability     Status  On-going      PT LONG TERM GOAL #2   Title  Pt will improve Rt shoulder AROM to that of the L UE with min stretch     Baseline  138 dg flexion , 150 deg abd , mod stretch, burning     Status  On-going      PT LONG TERM GOAL #3   Title  Pt will report no limitations with reaching overhead and back for home tasks      Status  On-going      PT LONG TERM GOAL #4   Title  Pt will demonstrate 5/5 Rt shoulder strength to maximize UE function.     Status  On-going      PT LONG TERM GOAL #5   Title  Pt will demo good sitting posture without cues and for reversal of daily work at the computer     Time  6    Period  Weeks    Status  New    Target Date  03/11/18            Plan - 02/05/18 1020    Clinical Impression Statement  Pt seen for Pilates focus PT.  Her previous PT, Marcene Brawn assessed her Rt inner arm, felt she may have the beginnings of cording.  She felt this could be treated by me with soft tissue and stretching.  Able to effectively work with [employer] for scapular mobility and myofascial stretching, core stability. No pain post.     PT Treatment/Interventions  ADLs/Self Care Home Management;Therapeutic exercise;Patient/family education;Manual techniques;Manual lymph drainage;Scar mobilization;Passive range of motion;Taping    PT Next Visit Plan  Cont Reformer/Tower focus on scap stabilty and gentle stretching. Manual before exercise to Rt arm     PT Home Exercise Plan  has from [facility] (foam roller red bands ), childs pose stretching     Consulted and Agree with Plan of Care  Patient       Patient will benefit from skilled therapeutic intervention in order to improve the following deficits and impairments:  Decreased range of motion, Pain, Increased fascial restricitons, Decreased strength, Postural dysfunction, Impaired UE functional use, Impaired flexibility  Visit Diagnosis: Stiffness of right shoulder, not elsewhere classified  Abnormal posture  Stiffness of left shoulder, not elsewhere classified  Muscle weakness (generalized)     Problem List Patient Active Problem List   Diagnosis Date Noted  . Cancer of central portion of right female breast ([place]) 01/28/2017  . 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, estrogen receptor positive ([place]) 01/08/2017     PAA,JENNIFER 02/05/2018, 10:53 AM  [facility] [address] [place], [place], [address] Phone: [phone]   Fax:  [phone]  Name: Angelica Floyd MRN: [id number] Date of Birth: 1966/04/21  Raeford Razor, PT 02/05/18 10:53 AM Phone: [phone] Fax: [phone]

## 2018-02-12 ENCOUNTER — Encounter: Payer: Self-pay | Admitting: Physical Therapy

## 2018-02-12 ENCOUNTER — Ambulatory Visit: Payer: BLUE CROSS/BLUE SHIELD | Attending: Hematology and Oncology | Admitting: Physical Therapy

## 2018-02-12 DIAGNOSIS — M25611 Stiffness of right shoulder, not elsewhere classified: Secondary | ICD-10-CM | POA: Diagnosis not present

## 2018-02-12 DIAGNOSIS — R293 Abnormal posture: Secondary | ICD-10-CM | POA: Diagnosis present

## 2018-02-12 DIAGNOSIS — M6281 Muscle weakness (generalized): Secondary | ICD-10-CM | POA: Insufficient documentation

## 2018-02-12 DIAGNOSIS — M25612 Stiffness of left shoulder, not elsewhere classified: Secondary | ICD-10-CM | POA: Diagnosis present

## 2018-02-12 NOTE — Therapy (Signed)
[place], [place], [address] Phone: [phone]   Fax:  [phone]  Physical Therapy Treatment  Patient Details  Name: Angelica Floyd MRN: [id number] Date of Birth: Oct 21, 1965 Referring Provider (PT): Dr. Lindi Adie   Encounter Date: 02/12/2018  PT End of Session - 02/12/18 0823    Visit Number  10    Number of Visits  13    Date for PT Re-Evaluation  03/11/18    PT Start Time  0804    PT Stop Time  0846    PT Time Calculation (min)  42 min    Activity Tolerance  Patient tolerated treatment well    Behavior During Therapy  [facility] for tasks assessed/performed       Past Medical History:  Diagnosis Date  . Anxiety   . History of chemotherapy    finished chemo 07/29/2017  . 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, estrogen receptor positive ([place]) 01/08/2017  . Seasonal allergies     Past Surgical History:  Procedure Laterality Date  . ABDOMINAL HYSTERECTOMY     partial  . ADENOIDECTOMY W/ MYRINGOTOMY    . APPENDECTOMY    . AXILLARY LYMPH NODE DISSECTION Right 02/14/2017   Procedure: RIGHT AXILLARY LYMPH NODE DISSECTION ERAS PATHWAY;  Surgeon: Jovita Kussmaul, MD;  Location: [place];  Service: General;  Laterality: Right;  . CESAREAN SECTION    . MASTECTOMY W/ SENTINEL NODE BIOPSY Bilateral 01/28/2017   RIGHT BREAST BIOPSY  . MASTECTOMY W/ SENTINEL NODE BIOPSY Bilateral 01/28/2017   Procedure: BILATERAL MASTECTOMY WITH RIGHT SENTINEL LYMPH NODE BIOPSY;  Surgeon: Jovita Kussmaul, MD;  Location: [place];  Service: General;  Laterality: Bilateral;  . PORT-A-CATH REMOVAL Left 08/08/2017   Procedure: REMOVAL PORT-A-CATH;  Surgeon: Jovita Kussmaul, MD;  Location: [place];  Service: General;  Laterality: Left;  . PORTACATH PLACEMENT Left 02/14/2017   Procedure: INSERTION PORT-A-CATH;  Surgeon: Jovita Kussmaul, MD;  Location: [place];  Service: General;  Laterality: Left;  .  RE-EXCISION OF BREAST CANCER,SUPERIOR MARGINS N/A 02/14/2017   Procedure: RE-EXCISION INFERIOR FLAP;  Surgeon: Jovita Kussmaul, MD;  Location: [place];  Service: General;  Laterality: N/A;  . WISDOM TOOTH EXTRACTION      There were no vitals filed for this visit.  Subjective Assessment - 02/12/18 0810    Subjective  I think the cording in my arm released. No pain today just sore and tight.      Currently in Pain?  No/denies           [facility] Adult PT Treatment/Exercise - 02/12/18 0001      Pilates   Pilates Reformer  see note       Shoulder Exercises: Supine   Horizontal ABduction  Strengthening;Both;10 reps    Theraband Level (Shoulder Horizontal ABduction)  --   unilateral on roller 3 lbs    Flexion  Strengthening;Both;15 reps    Theraband Level (Shoulder Flexion)  --   2 sets with circle    Other Supine Exercises  foam roll magic circles mobility and core stabilization      Shoulder Exercises: Stretch   Other Shoulder Stretches  supine thoracic foam roller extension 3 positions with breathing    added rotation     Chest opening on foam roller with prolonged pec stretch , palms up    Pilates Reformer used for LE/core strength, postural strength, lumbopelvic disassociation and core control.  Exercises included:  Quadruped  1 Red or 1 Blue for core, UE integration  UE x 10, then LE x 10, then combo x 10   Seated Arms 1 Red or 1 Blue (long sitting) horiz abd x 10 on blue   Seated 1 arm twist 1 Red x 10   Roll down x 10 with row x 10  Supine arms 1 Red x 10 Arcs, added knee extension x 6   Feet in straps 1 Red 1 yellow arcs and circles X 10 each direction     PT Education - 02/12/18 0957    Education provided  Yes    Education Details  PT community Pilates, posture, neutral, technique, foam roller     Person(s) Educated  Patient    Methods  Explanation    Comprehension  Verbalized understanding;Returned demonstration       PT Short Term Goals - 01/28/18 1040       PT SHORT TERM GOAL #1   Title  Pt. will report perception of at least 30% decrease in right axillary cording.    Status  Achieved        PT Long Term Goals - 02/12/18 0956      PT LONG TERM GOAL #1   Title  Pt will play pickleball without limitations in overhead play    Status  On-going      PT LONG TERM GOAL #2   Title  Pt will improve Rt shoulder AROM to that of the L UE with min stretch     Status  On-going      PT LONG TERM GOAL #3   Title  Pt will report no limitations with reaching overhead and back for home tasks     Status  On-going      PT LONG TERM GOAL #4   Title  Pt will demonstrate 5/5 Rt shoulder strength to maximize UE function.     Status  On-going      PT LONG TERM GOAL #5   Title  Pt will demo good sitting posture without cues and for reversal of daily work at the computer     Status  On-going            Plan - 02/12/18 0956    Clinical Impression Statement  Modified spring tension due to weakness in UE and lower core.  Began to incorporate LE into supine work for core and also to practice a relaxed, neutral position.  Resolution of arm cord as reported last time , did not do Manual for this reason.      PT Treatment/Interventions  ADLs/Self Care Home Management;Therapeutic exercise;Patient/family education;Manual techniques;Manual lymph drainage;Scar mobilization;Passive range of motion;Taping    PT Next Visit Plan  Check goals.  Cont Reformer/Tower focus on scap stabilty and gentle stretching. Manual before exercise to Rt arm     PT Home Exercise Plan  has from [facility] (foam roller red bands ), childs pose stretching     Consulted and Agree with Plan of Care  Patient       Patient will benefit from skilled therapeutic intervention in order to improve the following deficits and impairments:  Decreased range of motion, Pain, Increased fascial restricitons, Decreased strength, Postural dysfunction, Impaired UE functional use, Impaired  flexibility  Visit Diagnosis: Stiffness of right shoulder, not elsewhere classified  Abnormal posture  Stiffness of left shoulder, not elsewhere classified  Muscle weakness (generalized)     Problem List Patient Active Problem List  Diagnosis Date Noted  . Cancer of central portion of right female breast ([place]) 01/28/2017  . 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, estrogen receptor positive ([place]) 01/08/2017    Valory Wetherby 02/12/2018, 10:02 AM  [facility] [address] [place], [place], [address] Phone: [phone]   Fax:  [phone]  Name: Angelica Floyd MRN: [id number] Date of Birth: 18-Jan-1966  Raeford Razor, PT 02/12/18 10:08 AM Phone: [phone] Fax: [phone]

## 2018-02-17 ENCOUNTER — Ambulatory Visit: Payer: BLUE CROSS/BLUE SHIELD | Admitting: Physical Therapy

## 2018-02-17 DIAGNOSIS — M25611 Stiffness of right shoulder, not elsewhere classified: Secondary | ICD-10-CM | POA: Diagnosis not present

## 2018-02-17 DIAGNOSIS — M6281 Muscle weakness (generalized): Secondary | ICD-10-CM

## 2018-02-17 DIAGNOSIS — R293 Abnormal posture: Secondary | ICD-10-CM

## 2018-02-17 DIAGNOSIS — M25612 Stiffness of left shoulder, not elsewhere classified: Secondary | ICD-10-CM

## 2018-02-17 NOTE — Therapy (Signed)
[place], [place], [address] Phone: [phone]   Fax:  [phone]  Physical Therapy Treatment  Patient Details  Name: Angelica Floyd MRN: [id number] Date of Birth: 01/09/1966 Referring Provider (PT): Dr. Lindi Adie   Encounter Date: 02/17/2018  PT End of Session - 02/17/18 0931    Visit Number  11    Number of Visits  13    Date for PT Re-Evaluation  03/11/18    PT Start Time  0930    PT Stop Time  1012    PT Time Calculation (min)  42 min    Activity Tolerance  Patient tolerated treatment well    Behavior During Therapy  [facility] for tasks assessed/performed       Past Medical History:  Diagnosis Date  . Anxiety   . History of chemotherapy    finished chemo 07/29/2017  . 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, estrogen receptor positive ([place]) 01/08/2017  . Seasonal allergies     Past Surgical History:  Procedure Laterality Date  . ABDOMINAL HYSTERECTOMY     partial  . ADENOIDECTOMY W/ MYRINGOTOMY    . APPENDECTOMY    . AXILLARY LYMPH NODE DISSECTION Right 02/14/2017   Procedure: RIGHT AXILLARY LYMPH NODE DISSECTION ERAS PATHWAY;  Surgeon: Jovita Kussmaul, MD;  Location: [place];  Service: General;  Laterality: Right;  . CESAREAN SECTION    . MASTECTOMY W/ SENTINEL NODE BIOPSY Bilateral 01/28/2017   RIGHT BREAST BIOPSY  . MASTECTOMY W/ SENTINEL NODE BIOPSY Bilateral 01/28/2017   Procedure: BILATERAL MASTECTOMY WITH RIGHT SENTINEL LYMPH NODE BIOPSY;  Surgeon: Jovita Kussmaul, MD;  Location: [place];  Service: General;  Laterality: Bilateral;  . PORT-A-CATH REMOVAL Left 08/08/2017   Procedure: REMOVAL PORT-A-CATH;  Surgeon: Jovita Kussmaul, MD;  Location: [place];  Service: General;  Laterality: Left;  . PORTACATH PLACEMENT Left 02/14/2017   Procedure: INSERTION PORT-A-CATH;  Surgeon: Jovita Kussmaul, MD;  Location: [place];  Service: General;  Laterality: Left;  .  RE-EXCISION OF BREAST CANCER,SUPERIOR MARGINS N/A 02/14/2017   Procedure: RE-EXCISION INFERIOR FLAP;  Surgeon: Jovita Kussmaul, MD;  Location: [place];  Service: General;  Laterality: N/A;  . WISDOM TOOTH EXTRACTION      There were no vitals filed for this visit.  Subjective Assessment - 02/17/18 0943    Subjective  No pain, no new complaints. Has been doing HEP but not pickleball . She may consider an appt at [facility] for soft tissue work.     Currently in Pain?  No/denies         Pilates Reformer used for LE/core strength, postural strength, lumbopelvic disassociation and core control.  Exercises included:  Kneeling arms 1 blue diagonal pull (elbow flexed) , External rotation x 10 yellow spring needed for each UE   Oblique twist 1 blue , min cues for trunk stability   Pulling straps 1 Blue extension , then T x 10 , min cues for cervical and thoracic spine position   Mermaid 1 Red for lateral flexion, rib and thoracic mobility  Added full arm press out, spinal rotation with arm press, spinal extension , Added posterior pelvic tilt for hip mobility    Standing roll down 1 blue x 5 added arm press x 5 and rotation in T Spine x 3 each side   Pull ups over hand, under hand 1 blue , legs in table top for lower core  Single arm x 10 , yellow spring   PT Education - 02/17/18 1019    Education provided  Yes    Education Details  strength, theraband green ER     Person(s) Educated  Patient    Methods  Explanation    Comprehension  Verbalized understanding       PT Short Term Goals - 01/28/18 1040      PT SHORT TERM GOAL #1   Title  Pt. will report perception of at least 30% decrease in right axillary cording.    Status  Achieved        PT Long Term Goals - 02/17/18 0934      PT LONG TERM GOAL #1   Title  Pt will play pickleball without limitations in overhead play    Baseline  has not done     Status  On-going      PT LONG TERM GOAL #2   Title  Pt will improve Rt  shoulder AROM to that of the L UE with min stretch     Baseline  143 dg flexion , 150 deg abd , mod stretch, burning     Status  On-going      PT LONG TERM GOAL #3   Title  Pt will report no limitations with reaching overhead and back for home tasks     Status  On-going      PT LONG TERM GOAL #4   Title  Pt will demonstrate 5/5 Rt shoulder strength to maximize UE function.     Baseline  flex:    4+/5    abd:     4/5    ER:    4/5    IR:   5/5    Status  On-going      PT LONG TERM GOAL #5   Title  Pt will demo good sitting posture without cues and for reversal of daily work at the computer     Status  On-going            Plan - 02/17/18 1006    Clinical Impression Statement  Pt without increased pain , needed min cues for thoracic extension in prone, modified spring tension to accomodate L UE weakness.     PT Treatment/Interventions  ADLs/Self Care Home Management;Therapeutic exercise;Patient/family education;Manual techniques;Manual lymph drainage;Scar mobilization;Passive range of motion;Taping    PT Next Visit Plan  Check goals.  Cont Reformer/Tower focus on scap stabilty and gentle stretching. Manual before exercise to Rt arm     PT Home Exercise Plan  has from [facility] (foam roller red bands ), childs pose stretching     Consulted and Agree with Plan of Care  Patient       Patient will benefit from skilled therapeutic intervention in order to improve the following deficits and impairments:  Decreased range of motion, Pain, Increased fascial restricitons, Decreased strength, Postural dysfunction, Impaired UE functional use, Impaired flexibility  Visit Diagnosis: Stiffness of right shoulder, not elsewhere classified  Abnormal posture  Muscle weakness (generalized)  Stiffness of left shoulder, not elsewhere classified     Problem List Patient Active Problem List   Diagnosis Date Noted  . Cancer of central portion of right female breast ([place]) 01/28/2017  . Malignant  neoplasm of 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, estrogen receptor positive ([place]) 01/08/2017    PAA,JENNIFER 02/17/2018, 10:23 AM  [facility] [address] [place], [place], [address] Phone: [phone]  Fax:  [phone]  Name: Angelica Floyd MRN: [id number] Date of Birth: May 05, 1966  Raeford Razor, PT 02/17/18 10:27 AM Phone: [phone] Fax: [phone]

## 2018-02-19 ENCOUNTER — Inpatient Hospital Stay: Payer: BLUE CROSS/BLUE SHIELD | Attending: Hematology and Oncology | Admitting: Hematology and Oncology

## 2018-02-19 ENCOUNTER — Encounter: Payer: Self-pay | Admitting: *Deleted

## 2018-02-19 DIAGNOSIS — C773 Secondary and unspecified malignant neoplasm of axilla and upper limb lymph nodes: Secondary | ICD-10-CM

## 2018-02-19 DIAGNOSIS — Z79811 Long term (current) use of aromatase inhibitors: Secondary | ICD-10-CM | POA: Insufficient documentation

## 2018-02-19 DIAGNOSIS — Z923 Personal history of irradiation: Secondary | ICD-10-CM | POA: Diagnosis not present

## 2018-02-19 DIAGNOSIS — Z9013 Acquired absence of bilateral breasts and nipples: Secondary | ICD-10-CM | POA: Insufficient documentation

## 2018-02-19 DIAGNOSIS — Z170 Estrogen receptor positive status [ER+]: Principal | ICD-10-CM

## 2018-02-19 DIAGNOSIS — Z9221 Personal history of antineoplastic chemotherapy: Secondary | ICD-10-CM | POA: Diagnosis not present

## 2018-02-19 DIAGNOSIS — Z79899 Other long term (current) drug therapy: Secondary | ICD-10-CM | POA: Diagnosis not present

## 2018-02-19 DIAGNOSIS — C50511 Malignant neoplasm of lower-outer quadrant of right female breast: Secondary | ICD-10-CM | POA: Diagnosis not present

## 2018-02-19 NOTE — Assessment & Plan Note (Signed)
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 adjuvant chemotherapy with dose dense Adriamycin and Cytoxan x4 followed by Taxol weekly x12 completed 07/25/17 3. Adjuvant radiation 08/20/17- 10/02/17 4. Adjuvant antiestrogen therapy with tamoxifen (which was originally started prior to surgery) switched to letrozole to be started 10/26/2017 ---------------------------------------------------------------------- Patient is menopausal based upon the blood work done in March. Previously she tolerated Tamoxifen extremely well.  Letrozole toxicities:  Breast cancer surveillance: 1. Breast exam August 2019 2. 12/12/2017: Palpable nodule left breast targeted ultrasound suggests fat necrosis.  28-monthfollow-up ultrasound was recommended.   3. Bone density test 11/26/2017:T score -1.3: Mild osteopenia   Return to clinic in 1 year for follow-up

## 2018-02-19 NOTE — Research (Signed)
ABC: Screening Prior to Registration  02/19/18 Patient Angelica Floyd presents today unaccompanied for her routine visit with Dr. Lindi Adie.  The patient also consented to the ABC trial today and Screening procedures were initiated:  Informed Consent:  See Consent Form Encounter dated 01/10/18 for details of the consent process.    Physical Exam, PS, Vital Signs, BMI:  These assessments were performed as part of the patient's routine visit today.  Please see encounter with PI Dr. Lindi Adie with today's date for documentation.  Medical History and Concomitant Medications:  The patient's medical history and concomitant medications were reviewed with the patient. Updates were documented within Epic.   Additionally, the patient denies regular NSAID use, she said she occasionally uses ibuprofen 1-2 times per month.  This RN explained that using acetaminophen/Tylenol is recommended, if needed, during the course of the study because using NSAIDs like ibuprofen can increase the chance of bleeding events.  The patient verbalized an understanding and is willing to use Acetaminophen during the course of the study over ibuprofen. Furthermore the patient denies a history of all of the following: - gastric/duodenal ulcers, and gastric/duodenal ulcers requiring transfusion or any treatment - GI bleeding, and GI bleeding requiring a transfusion or any intervention - stroke of any kind - concurrent anticoagulant use of any kind - atrial fibrillation - myocardial infarction - hypertension, and hypertension requiring any kind of treatment or intervention (including malignant HTN, neurological deficit, hypertensive crisis) - chronic use of steroids, and current use of steroids  - allergy to aspirin - history of any other malignancy beyond her breast cancer diagnosis, which is not stage IV (metastatic).  Questionnaire:  Patient was provided with questionnaire booklet to complete independently.  This RN reviewed to ensure  completeness.  Visit Scheduling:  Patient has a routine screening procedure scheduled for 03/04/18 so she would not like to initiate study treatment until after that.  Tentatively scheduled a visit on 03/07/18 for the patient's Randomization visit.  The patient was thanked for her time and willingness to participate in the trial. Doreatha Martin, RN, BSN, [facility] 02/19/2018 11:30 AM

## 2018-02-19 NOTE — Progress Notes (Signed)
Patient Care Team: Jettie Booze, NP as PCP - General (Family Medicine) Nicholas Lose, MD as Consulting Physician (Hematology and Oncology) Jovita Kussmaul, MD as Consulting Physician (General Surgery) Gery Pray, MD as Consulting Physician (Radiation Oncology) Delice Bison Charlestine Massed, NP as Nurse Practitioner (Hematology and Oncology)  DIAGNOSIS:  Encounter Diagnosis  Name Primary?  . 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, estrogen receptor positive ([place])     SUMMARY OF ONCOLOGIC HISTORY:   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, estrogen receptor positive ([place])   01/02/2017 Initial Diagnosis    Palpable right breast mass retroareolar 6:30 position: 3.6 cm size axilla negative, 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 HER-2 negative ratio 1.31 Ki-67 3% in addition calcifications UIQ 1.4 cm stereotactic biopsy flat epithelial atypia; clips are 4.3 cm apart, T2 N0 stage IB clinical stage AJCC 8    01/28/2017 Surgery    Bilateral mastectomies: Right: Grade 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%, PR 95%, HER-2 negative ratio 1.31, Ki-67 3%, 4/4 lymph nodes positive; left mastectomy: PASH and FC changes, no malignancy; T2 N2,  stage IIA AJCC 8    02/14/2017 Surgery    Right axillary lymph node dissection 8/11 lymph nodes positive    03/06/2017 - 07/25/2017 Chemotherapy    Dose dense AC x4 followed by Taxol x12    08/20/2017 - 10/02/2017 Radiation Therapy    Adj XRT    10/2017 -  Anti-estrogen oral therapy    Letrozole daily     CHIEF COMPLIANT: Follow-up on letrozole therapy  INTERVAL HISTORY: Angelica Floyd is a 52 year old with above-mentioned history of right breast cancer currently on letrozole and tolerating it moderately well.  She does have muscle stiffness and achiness especially after resting for a while.  She once she gets moving symptoms get better.  She has been taking turmeric which has not really made any  difference.  Denies any hot flashes.  She felt a small lump in the left chest wall which turned out to be fat necrosis.  She has another ultrasound going on in 3 months.  She has a colonoscopy planned in 2 weeks.  REVIEW OF SYSTEMS:   Constitutional: Denies fevers, chills or abnormal weight loss Eyes: Denies blurriness of vision Ears, nose, mouth, throat, and face: Denies mucositis or sore throat Respiratory: Denies cough, dyspnea or wheezes Cardiovascular: Denies palpitation, chest discomfort Gastrointestinal:  Denies nausea, heartburn or change in bowel habits Skin: Denies abnormal skin rashes Lymphatics: Denies new lymphadenopathy or easy bruising Neurological:Denies numbness, tingling or new weaknesses Behavioral/Psych: Mood is stable, no new changes  Extremities: No lower extremity edema Chest wall: More prominent left chest wall.  No palpable lumps or nodules  All other systems were reviewed with the patient and are negative.  I have reviewed the past medical history, past surgical history, social history and family history with the patient and they are unchanged from previous note.  ALLERGIES:  has No Known Allergies.  MEDICATIONS:  Current Outpatient Medications  Medication Sig Dispense Refill  . acetaminophen (TYLENOL) 325 MG tablet Take 650 mg by mouth every 6 (six) hours as needed.    . calcium-vitamin D (OSCAL WITH D) 500-200 MG-UNIT tablet Take 2 tablets by mouth.    . letrozole (FEMARA) 2.5 MG tablet Take 1 tablet (2.5 mg total) by mouth daily. 90 tablet 3  . LORazepam (ATIVAN) 0.5 MG tablet Take 1 tablet (0.5 mg total) by mouth  at bedtime. 30 tablet 0  . Multiple Vitamin (MULTIVITAMIN) tablet Take 1 tablet by mouth daily.     No current facility-administered medications for this visit.     PHYSICAL EXAMINATION: ECOG PERFORMANCE STATUS: 1 - Symptomatic but completely ambulatory  Vitals:   02/19/18 0843  BP: 125/81  Pulse: 77  Resp: 18  Temp: 98 F (36.7 C)    SpO2: 100%   Filed Weights   02/19/18 0843  Weight: 122 lb 8 oz (55.6 kg)    GENERAL:alert, no distress and comfortable SKIN: skin color, texture, turgor are normal, no rashes or significant lesions EYES: normal, Conjunctiva are pink and non-injected, sclera clear OROPHARYNX:no exudate, no erythema and lips, buccal mucosa, and tongue normal  NECK: supple, thyroid normal size, non-tender, without nodularity LYMPH:  no palpable lymphadenopathy in the cervical, axillary or inguinal LUNGS: clear to auscultation and percussion with normal breathing effort HEART: regular rate & rhythm and no murmurs and no lower extremity edema ABDOMEN:abdomen soft, non-tender and normal bowel sounds MUSCULOSKELETAL:no cyanosis of digits and no clubbing  NEURO: alert & oriented x 3 with fluent speech, no focal motor/sensory deficits EXTREMITIES: No lower extremity edema BREAST: No palpable lumps or nodules in bilateral chest wall or axilla (exam performed in the presence of a chaperone)  LABORATORY DATA:  I have reviewed the data as listed CMP Latest Ref Rng & Units 07/25/2017 07/18/2017 07/11/2017  Glucose 70 - 140 mg/dL 103 101 106  BUN 7 - 26 mg/dL '12 9 9  '$ Creatinine 0.60 - 1.10 mg/dL 0.83 0.87 0.84  Sodium 136 - 145 mmol/L 140 139 139  Potassium 3.5 - 5.1 mmol/L 3.8 3.6 3.8  Chloride 98 - 109 mmol/L 105 104 104  CO2 22 - 29 mmol/L '27 26 27  '$ Calcium 8.4 - 10.4 mg/dL 9.7 9.6 9.7  Total Protein 6.4 - 8.3 g/dL 6.7 6.7 6.7  Total Bilirubin 0.2 - 1.2 mg/dL 0.3 0.4 0.3  Alkaline Phos 40 - 150 U/L 54 57 63  AST 5 - 34 U/L '22 23 24  '$ ALT 0 - 55 U/L '23 27 26    '$ Lab Results  Component Value Date   WBC 3.1 (L) 07/25/2017   HGB 12.0 07/25/2017   HCT 35.0 07/25/2017   MCV 92.6 07/25/2017   PLT 233 07/25/2017   NEUTROABS 1.8 07/25/2017    ASSESSMENT & PLAN:  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, estrogen receptor positive ([place]) 01/28/2017: Bilateral mastectomies: Right:  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 adjuvant chemotherapy with dose dense Adriamycin and Cytoxan x4 followed by Taxol weekly x12 completed 07/25/17 3. Adjuvant radiation 08/20/17- 10/02/17 4. Adjuvant antiestrogen therapy with tamoxifen (which was originally started prior to surgery) switched to letrozole to be started 10/26/2017 ---------------------------------------------------------------------- Patient is menopausal based upon the blood work done in March. Previously she tolerated Tamoxifen extremely well.  Letrozole toxicities: Muscle stiffness and achiness: She is on turmeric but it has not been helping her.  She will try glucosamine.  I also discussed about tonic water.  Patient is interested in  ABC clinical trial: This is a trial randomizing patients between aspirin versus placebo.  Breast cancer surveillance: 1.  Chest wall exam 02/19/2018: No palpable lumps or nodules.  The left chest wall appears to be more prominent than the right but it is due to her rib cage. 2. 12/12/2017: Palpable nodule left breast targeted ultrasound suggests fat necrosis.  35-monthfollow-up ultrasound was recommended.   3. Bone density test 11/26/2017:T score -1.3: Mild osteopenia   Return to clinic in 1 year for follow-up  No orders of the defined types were placed in this encounter.  The patient has a good understanding of the overall plan. she agrees with it. she will call with any problems that may develop before the next visit here.   VHarriette Ohara MD 02/19/18

## 2018-02-24 ENCOUNTER — Telehealth: Payer: Self-pay | Admitting: *Deleted

## 2018-02-24 NOTE — Telephone Encounter (Signed)
ABC Screening  02/24/18 12:11 PM Discussed with patient that the research specimen to be collected cannot be collected on Friday.  I provided the option of patient coming in just for lab on another day and keeping Research appointment to start treatment with ASA/Placebo 03/07/18, or to come in another day to do both visits on same day.  Patient would prefer to complete in one day.  Will reschedule to 03/11/18 after patient's RT visit, per her request.  This visit is within window.  Patient was thanked for her flexibility. Doreatha Martin, RN, BSN, [employer] [facility] 02/24/2018 12:14 PM

## 2018-02-25 ENCOUNTER — Encounter: Payer: BLUE CROSS/BLUE SHIELD | Admitting: Physical Therapy

## 2018-02-25 ENCOUNTER — Other Ambulatory Visit: Payer: Self-pay | Admitting: *Deleted

## 2018-02-25 DIAGNOSIS — Z170 Estrogen receptor positive status [ER+]: Principal | ICD-10-CM

## 2018-02-25 DIAGNOSIS — C50511 Malignant neoplasm of lower-outer quadrant of right female breast: Secondary | ICD-10-CM

## 2018-02-25 NOTE — Research (Signed)
re

## 2018-03-04 ENCOUNTER — Encounter: Payer: BLUE CROSS/BLUE SHIELD | Admitting: Physical Therapy

## 2018-03-06 ENCOUNTER — Encounter: Payer: Self-pay | Admitting: *Deleted

## 2018-03-06 DIAGNOSIS — C50511 Malignant neoplasm of lower-outer quadrant of right female breast: Secondary | ICD-10-CM

## 2018-03-06 DIAGNOSIS — Z170 Estrogen receptor positive status [ER+]: Principal | ICD-10-CM

## 2018-03-06 NOTE — Research (Signed)
ABC  03/06/18 Patient is eligible for participation in the ABC study and has been registered and randomized into the trial.  Doreatha Martin, RN, BSN, [facility] 03/06/2018 4:33 PM

## 2018-03-07 ENCOUNTER — Encounter: Payer: BLUE CROSS/BLUE SHIELD | Admitting: *Deleted

## 2018-03-07 ENCOUNTER — Encounter: Payer: Self-pay | Admitting: Hematology and Oncology

## 2018-03-07 ENCOUNTER — Ambulatory Visit: Payer: BLUE CROSS/BLUE SHIELD | Admitting: Physical Therapy

## 2018-03-07 DIAGNOSIS — M6281 Muscle weakness (generalized): Secondary | ICD-10-CM

## 2018-03-07 DIAGNOSIS — M25611 Stiffness of right shoulder, not elsewhere classified: Secondary | ICD-10-CM

## 2018-03-07 DIAGNOSIS — R293 Abnormal posture: Secondary | ICD-10-CM

## 2018-03-07 NOTE — Therapy (Signed)
[place], [place], [address] Phone: [phone]   Fax:  [phone]  Physical Therapy Treatment  Patient Details  Name: Angelica Floyd MRN: [id number] Date of Birth: 05-22-1965 Referring Provider (PT): Dr. Lindi Adie   Encounter Date: 03/07/2018  PT End of Session - 03/07/18 0843    Visit Number  12    Number of Visits  13    Date for PT Re-Evaluation  03/11/18    PT Start Time  0802    PT Stop Time  0850    PT Time Calculation (min)  48 min    Activity Tolerance  Patient tolerated treatment well    Behavior During Therapy  [facility] for tasks assessed/performed       Past Medical History:  Diagnosis Date  . Anxiety    started at the time of her breast cancer diagnosis (August 2018)  . High cholesterol    Per patient 02/19/18: Her LDL was 100, but HDL was elevated; no intervention at this time.  Marland Kitchen History of chemotherapy    finished chemo 07/29/2017  . Hot flashes started after letrozole dosing began   per patient 02/19/18  . Hypocalcemia    per patient 02/19/18: Her recent Calcium level was elevated per her PCP, so she takes only one calcium supplment daily (historically she took 2)  . Insomnia started after breast cancer surgery   Per 02/19/18: due to anxiety and hot flashes  . Joint pain started at time of letrozole start   per patient 02/19/18  . 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, estrogen receptor positive ([place]) 01/08/2017  . Seasonal allergies    per patient 02/19/18: She has been experiencing for at least 20 years    Past Surgical History:  Procedure Laterality Date  . ABDOMINAL HYSTERECTOMY     partial; ovaires remain  . ADENOIDECTOMY W/ MYRINGOTOMY  at age 16  . APPENDECTOMY     2015  . AXILLARY LYMPH NODE DISSECTION Right 02/14/2017   Procedure: RIGHT AXILLARY LYMPH NODE DISSECTION ERAS PATHWAY;  Surgeon: Jovita Kussmaul, MD;  Location: [place];  Service: General;   Laterality: Right;  . CESAREAN SECTION  2005  . MASTECTOMY W/ SENTINEL NODE BIOPSY Bilateral 01/28/2017   RIGHT BREAST BIOPSY  . MASTECTOMY W/ SENTINEL NODE BIOPSY Bilateral 01/28/2017   Procedure: BILATERAL MASTECTOMY WITH RIGHT SENTINEL LYMPH NODE BIOPSY;  Surgeon: Jovita Kussmaul, MD;  Location: [place];  Service: General;  Laterality: Bilateral;  . PORT-A-CATH REMOVAL Left 08/08/2017   Procedure: REMOVAL PORT-A-CATH;  Surgeon: Jovita Kussmaul, MD;  Location: [place];  Service: General;  Laterality: Left;  . PORTACATH PLACEMENT Left 02/14/2017   Procedure: INSERTION PORT-A-CATH;  Surgeon: Jovita Kussmaul, MD;  Location: [place];  Service: General;  Laterality: Left;  . RE-EXCISION OF BREAST CANCER,SUPERIOR MARGINS N/A 02/14/2017   Procedure: RE-EXCISION INFERIOR FLAP;  Surgeon: Jovita Kussmaul, MD;  Location: [place];  Service: General;  Laterality: N/A;  . WISDOM TOOTH EXTRACTION  at age 37    There were no vitals filed for this visit.  Subjective Assessment - 03/07/18 0833    Subjective  I think I may have another cord. Was out of town for [employer] been as good about my exercises. No pain. I feel curved forward though.          [facility] Adult PT Treatment/Exercise - 03/07/18 0001      Self-Care  Other Self-Care Comments   massage for maintenance      Pilates   Pilates Reformer  Prone pulling straps 1 Red then only 1 blue for added thoracic extension and horizontal abduction   mod cues for head neck and shoulder position      Manual Therapy   Soft tissue mobilization  Rt UE: pectoralis minor, major, subscapularis, biceps   mod to deep pressure, pin and stretch    Myofascial Release  Rt scapula    Passive ROM  all planes      Pilates Reformer used for LE/core strength, postural strength, lumbopelvic disassociation and core control.  Exercises included:  Supine Arm work 1 Red 1 yellow Arcs in parallel and T x 10 each.  Cues for shoulder joint congruency, scapular  neutral.    Feet in Straps 1 Red 1 Yellow  Arcs, circles and squats x 10 each  Used manual to monitor lumbopelvic stability.     PT Education - 03/07/18 [phone]    Education provided  Yes    Education Details  massage therapy resources, Pilates and technique for Reformer     Person(s) Educated  Patient    Methods  Explanation    Comprehension  Verbalized understanding       PT Short Term Goals - 01/28/18 1040      PT SHORT TERM GOAL #1   Title  Pt. will report perception of at least 30% decrease in right axillary cording.    Status  Achieved        PT Long Term Goals - 03/07/18 1108      PT LONG TERM GOAL #1   Title  Pt will play pickleball without limitations in overhead play    Status  On-going      PT LONG TERM GOAL #2   Title  Pt will improve Rt shoulder AROM to that of the L UE with min stretch     Status  On-going      PT LONG TERM GOAL #3   Title  Pt will report no limitations with reaching overhead and back for home tasks     Status  On-going      PT LONG TERM GOAL #4   Title  Pt will demonstrate 5/5 Rt shoulder strength to maximize UE function.     Status  Unable to assess      PT LONG TERM GOAL #5   Title  Pt will demo good sitting posture without cues and for reversal of daily work at the computer     Status  On-going            Plan - 03/07/18 1108    Clinical Impression Statement  Patient has not been to PT for 2 weeks.  Overall, doing well but continues to have soft tissue restriction and poor posture in sitting unless cued.  She had tightness in Rt shoulder that improved greatly after manual.  Pain refers down to inner elbow. Plans to try community based Pilates with a friend.  May benefit from more PT as she makes this transition but she will contemplate over the weekend.     PT Treatment/Interventions  ADLs/Self Care Home Management;Therapeutic exercise;Patient/family education;Manual techniques;Manual lymph drainage;Scar mobilization;Passive  range of motion;Taping    PT Next Visit Plan  manual if needed. Check goals.  Cont Reformer/Tower focus on scap stabilty and gentle stretching. Manual before exercise to Rt arm     PT Home Exercise Plan  has from [facility]  rehab (foam roller red bands ), childs pose stretching     Consulted and Agree with Plan of Care  Patient       Patient will benefit from skilled therapeutic intervention in order to improve the following deficits and impairments:  Decreased range of motion, Pain, Increased fascial restricitons, Decreased strength, Postural dysfunction, Impaired UE functional use, Impaired flexibility  Visit Diagnosis: Stiffness of right shoulder, not elsewhere classified  Abnormal posture  Muscle weakness (generalized)     Problem List Patient Active Problem List   Diagnosis Date Noted  . Cancer of central portion of right female breast ([place]) 01/28/2017  . 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, estrogen receptor positive ([place]) 01/08/2017    Angelica Floyd 03/07/2018, 11:11 AM  [facility] [address] [place], [place], [address] Phone: [phone]   Fax:  [phone]  Name: Angelica Floyd MRN: [id number] Date of Birth: July 18, 1965  Raeford Razor, PT 03/07/18 11:13 AM Phone: [phone] Fax: [phone]

## 2018-03-11 ENCOUNTER — Encounter: Payer: Self-pay | Admitting: Physical Therapy

## 2018-03-11 ENCOUNTER — Ambulatory Visit: Payer: BLUE CROSS/BLUE SHIELD | Admitting: Physical Therapy

## 2018-03-11 ENCOUNTER — Encounter: Payer: Self-pay | Admitting: *Deleted

## 2018-03-11 ENCOUNTER — Inpatient Hospital Stay: Payer: BLUE CROSS/BLUE SHIELD | Admitting: *Deleted

## 2018-03-11 ENCOUNTER — Inpatient Hospital Stay: Payer: BLUE CROSS/BLUE SHIELD

## 2018-03-11 DIAGNOSIS — C50511 Malignant neoplasm of lower-outer quadrant of right female breast: Secondary | ICD-10-CM

## 2018-03-11 DIAGNOSIS — M6281 Muscle weakness (generalized): Secondary | ICD-10-CM

## 2018-03-11 DIAGNOSIS — R293 Abnormal posture: Secondary | ICD-10-CM

## 2018-03-11 DIAGNOSIS — M25611 Stiffness of right shoulder, not elsewhere classified: Secondary | ICD-10-CM | POA: Diagnosis not present

## 2018-03-11 DIAGNOSIS — Z170 Estrogen receptor positive status [ER+]: Principal | ICD-10-CM

## 2018-03-11 LAB — RESEARCH LABS

## 2018-03-11 MED ORDER — INV-ASPIRIN/PLACEBO 300 MG TABS ALLIANCE A011502: 1.0000 | ORAL_TABLET | Freq: Every day | ORAL | 0 refills | Status: DC

## 2018-03-11 NOTE — Therapy (Addendum)
[place], [place], [address] Phone: [phone]   Fax:  [phone]  Physical Therapy Treatment  Patient Details  Name: Angelica Floyd MRN: [id number] Date of Birth: 03-10-1966 Referring Provider (PT): Dr. Lindi Adie   Encounter Date: 03/11/2018  PT End of Session - 03/11/18 0848    Visit Number  13    Number of Visits  13    Date for PT Re-Evaluation  03/11/18    PT Start Time  0845    PT Stop Time  0927    PT Time Calculation (min)  42 min    Activity Tolerance  Patient tolerated treatment well    Behavior During Therapy  [facility] for tasks assessed/performed       Past Medical History:  Diagnosis Date  . Anxiety    started at the time of her breast cancer diagnosis (August 2018)  . High cholesterol    Per patient 02/19/18: Her LDL was 100, but HDL was elevated; no intervention at this time.  Marland Kitchen History of chemotherapy    finished chemo 07/29/2017  . Hot flashes started after letrozole dosing began   per patient 02/19/18  . Hypocalcemia    per patient 02/19/18: Her recent Calcium level was elevated per her PCP, so she takes only one calcium supplment daily (historically she took 2)  . Insomnia started after breast cancer surgery   Per 02/19/18: due to anxiety and hot flashes  . Joint pain started at time of letrozole start   per patient 02/19/18  . 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, estrogen receptor positive ([place]) 01/08/2017  . Seasonal allergies    per patient 02/19/18: She has been experiencing for at least 20 years    Past Surgical History:  Procedure Laterality Date  . ABDOMINAL HYSTERECTOMY  2015   partial; ovaires remain  . ADENOIDECTOMY W/ MYRINGOTOMY  at age 6  . APPENDECTOMY     2015  . AXILLARY LYMPH NODE DISSECTION Right 02/14/2017   Procedure: RIGHT AXILLARY LYMPH NODE DISSECTION ERAS PATHWAY;  Surgeon: Jovita Kussmaul, MD;  Location: [place];  Service:  General;  Laterality: Right;  . CESAREAN SECTION  2005  . MASTECTOMY W/ SENTINEL NODE BIOPSY Bilateral 01/28/2017   RIGHT BREAST BIOPSY  . MASTECTOMY W/ SENTINEL NODE BIOPSY Bilateral 01/28/2017   Procedure: BILATERAL MASTECTOMY WITH RIGHT SENTINEL LYMPH NODE BIOPSY;  Surgeon: Jovita Kussmaul, MD;  Location: [place];  Service: General;  Laterality: Bilateral;  . PORT-A-CATH REMOVAL Left 08/08/2017   Procedure: REMOVAL PORT-A-CATH;  Surgeon: Jovita Kussmaul, MD;  Location: [place];  Service: General;  Laterality: Left;  . PORTACATH PLACEMENT Left 02/14/2017   Procedure: INSERTION PORT-A-CATH;  Surgeon: Jovita Kussmaul, MD;  Location: [place];  Service: General;  Laterality: Left;  . RE-EXCISION OF BREAST CANCER,SUPERIOR MARGINS N/A 02/14/2017   Procedure: RE-EXCISION INFERIOR FLAP;  Surgeon: Jovita Kussmaul, MD;  Location: [place];  Service: General;  Laterality: N/A;  . WISDOM TOOTH EXTRACTION  at age 34    There were no vitals filed for this visit.  Subjective Assessment - 03/11/18 0847    Subjective  Tight but no pain.  Back down where the stiffness usually is.        Pilates Reformer used for LE/core strength, postural strength, lumbopelvic disassociation and core control.  Exercises included:  Footwork 2 Red 1 blue all foot positions. Fatigue with press out on toes.  Heel raises x 10 eccentric focus.  Good core stability here.  Single leg x 10  Bridging all springs to decrease hamstring cramping, x 10   Supine Arm work 1 Red 1 yellow Arcs, circles and triceps, legs in table top.  Feet in Straps Arcs 1 Yellow 1 Red Arcs   Reverse Abdominals 1 Blue UE x 6, LE x 8   Mermaid 1 Red with manual A for Rt trunk elongation   Corner stretch x 3          [facility] Adult PT Treatment/Exercise - 03/11/18 0001      Pilates   Pilates Reformer  see note        PT Short Term Goals - 01/28/18 1040      PT SHORT TERM GOAL #1   Title  Pt. will report perception of at least 30%  decrease in right axillary cording.    Status  Achieved        PT Long Term Goals - 03/11/18 0936      PT LONG TERM GOAL #1   Title  Pt will play pickleball without limitations in overhead play    Baseline  just has not done this     Status  Not Met      PT LONG TERM GOAL #2   Title  Pt will improve Rt shoulder AROM to that of the L UE with min stretch     Baseline  end range tightness     Status  Partially Met      PT LONG TERM GOAL #3   Title  Pt will report no limitations with reaching overhead and back for home tasks     Baseline  reaching back tight, not measured     Status  Partially Met      PT LONG TERM GOAL #4   Title  Pt will demonstrate 5/5 Rt shoulder strength to maximize UE function.     Status  Partially Met      PT LONG TERM GOAL #5   Title  Pt will demo good sitting posture without cues and for reversal of daily work at the computer     Baseline  needs cues but can correct     Status  Partially Met            Plan - 03/11/18 1005    Clinical Impression Statement  Patient has been introduced to Pilates and benefitted from stretching and core strengthening. SHe plans to continue Pilates now.  She continues to have soft tissue restriction in Rt UE, thoracic spine. She has improved in postural awareness but needs come cues for positioning at times.  She has completed her PT visits for Ortho, may consider going back to [facility] for further treatment.  Will follow up with PT she worked previously with.     PT Treatment/Interventions  ADLs/Self Care Home Management;Therapeutic exercise;Patient/family education;Manual techniques;Manual lymph drainage;Scar mobilization;Passive range of motion;Taping    PT Next Visit Plan  DC from [place]  has from [facility] re[facility] (foam roller red bands ), childs pose stretching     Consulted and Agree with Plan of Care  Patient       Patient will benefit from skilled therapeutic intervention in order to  improve the following deficits and impairments:  Decreased range of motion, Pain, Increased fascial restricitons, Decreased strength, Postural dysfunction, Impaired UE functional use, Impaired flexibility  Visit Diagnosis: Stiffness of right shoulder,  not elsewhere classified  Abnormal posture  Muscle weakness (generalized)     Problem List Patient Active Problem List   Diagnosis Date Noted  . Cancer of central portion of right female breast ([place]) 01/28/2017  . 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, estrogen receptor positive ([place]) 01/08/2017    Taven Strite 03/11/2018, 12:53 PM  [place] [facility] [address] [place], [place], [address] Phone: [phone]   Fax:  [phone]  Name: Angelica Floyd MRN: [id number] Date of Birth: February 19, 1966   PHYSICAL THERAPY DISCHARGE SUMMARY  Visits from Start of Care: 13  Current functional level related to goals / functional outcomes: See above    Remaining deficits: Pt did complain of cording in UE which she may have addressed by PTs at [facility] and or massage therapist   Education / Equipment: Pilates, core, HEP  Plan: Patient agrees to discharge.  Patient goals were partially met. Patient is being discharged due to being pleased with the current functional level.  ?????     Raeford Razor, PT 06/04/18 10:13 AM Phone: [phone] Fax: [phone]

## 2018-03-11 NOTE — Research (Signed)
ABC Baseline - Treatment Day 1  03/11/18 - Patient Angelica Floyd presents today for the purpose of completing the Baseline visit for the ABC study.  The patient was recently seen (02/19/18) by physician investigator Dr. Lindi Adie for her H&P, vitals and baseline AE assessment.  The patient consented to study participation and was verified eligible for participation, and subsequently randomized.  See prior documentation for complete documentation of consenting, baseline assessment and previously completed procedures.  Today the patient presents for dispensing study medication.  Lab - Patient had blood and urine specimen collected, according to the study protocol.  These specimen are to be shipped to the protocol contact and used for future research.  The patient's pathology specimen has also been requested and will also be shipped to the protocol contact.  Medical History and Concomitant Medications - The patient's medication list was again reviewed with the patient and she confirmed no changes in her concomitant medication use.  The patient has not used any NSAIDs since her last appointment on 02/19/18.  The patient did use the oral prep prior to her colonoscopy, but this is not continuing and so is not required to be reported per protocol (protocol requires reporting medications used on treatment day 1 and forward). This RN discussed with the patient that turmeric may also contribute to bruising and bleeding and so the patient should monitor for any new bruising and bleeding and alert study staff if she experiences any bruising or bleeding while she is also taking turmeric.  This RN explained that the use of turmeric was discussed with Dr. Lindi Adie and that she can continue turmeric use.  The patient denies any questions regarding this. The patient any denies having any changes in her health since her visit on 02/19/18.  Per the patient, her colonoscopy showed no abnormality.   Baseline History Status:  See  chart below for active and relevant medical history present at the time of treatment initiation. Baseline Medical History/Adverse Event Table Baseline  Grade (CTCAE v4.0) Relatedness Comments  Anxiety 1 NA; prior to study initiation PRN ativan for sleep; does not limit ADLs  Insomnia 1 NA; prior to study initiation PRN ativan for sleep; does not limit ADLs  Pain (joint pain) 1 NA; prior to study intiation PRN tylenol; QD turmeric+black pepper supplement; does not limit ADLs  Hypocalcemia 1 NA; prior to study initiation Previously hypocalcemia; though experienced hypercalcemia at exam with PCP on 01/21/18.  Takes QD calcium+Vit D supplement; started lower dose 01/21/18 due to hypercalcemia result.  Does not limit ADLs  Hot flashes 1 NA; prior to study initiation Does not limit ADLs, no intervention.  Immune system disorders, other: Seasonal Allergies 1 NA; prior to study initiation PRN OTC; does not limit ADLs    Baseline Solicited Adverse Events:  Protocol requires specific AEs be solicited during study visits.  See table below: Solicited Adverse Event Table Solicited Adverse Event Present? Comments  Gastrointestinal Bleeding No   Intracranial hemmorrhage No   Epistaxis No   Hematuria No   Dyspepsia No   Gastritis No   Bruising No    Investigational Medication - The RN educated the patient on how to take and store the investigational asa/placebo.  The nursing guidelines and storage instructions listed in the protocol and on the medication bottle were reviewed and the patient verbalized an understanding and denies any questions.  Patient confirmed she has study team and physician contact information.  Patient asks how to inform her other care providers she  is on the study.  This RN explained that the medication is listed on the patient's medication list and that she should inform her other providers she is taking an investigational medication that may be either 300mg  aspirin or matching placebo.   The patient verbalized an understanding.   Due to [facility] investigational medication supply expiring in March 2020; the patient is only provided with a 3 month supply at this time, instead of a full 6 month supply.  This RN explained to the patient that another 3 month supply bottle will be received and provided to the patient around January 2020.  The patient verbalizes an understanding. The patient medication diaries were provided to the patient and this RN instructed the patient on how to complete.  Patient verbalizes understanding.  This RN collected the investigational medication bottle from the pharmacy and provided to the patient.  Bottle number [id number] containing 90 pills of 300mg  aspirin or matching placebo, expiring in March 2020, were provided to the patient.  The patient is to return the medication diaries and pill bottle at her next study visit.  The patient will take her first dose of investigational medication today.  Study Visit Planning - The patient's next visit will occur around the end of April 2020.  The patient wishes to wait and schedule this visit closer to the anticipated date to allow her an opportunity to determine her and her family's schedule.  It will be decided at the time of resupply if the patient will come to the [facility] for her 3 month resupply or if the study medication will be mailed to the patient's home.  The patient was thanked for her participation in the ABC study.  Doreatha Martin, RN, BSN, [facility] 03/11/2018 11:55 AM

## 2018-03-18 ENCOUNTER — Telehealth: Payer: Self-pay | Admitting: *Deleted

## 2018-03-18 NOTE — Telephone Encounter (Signed)
ABC Study:  03/18/18  This RN called patient Angelica Floyd to ensure she has been taking her investigational aspirin 300mg /placebo daily and ensure patient has no concerns at this time.  No answer.  Left voicemail with above information and left contact information for patient to return call if she has any concerns. Doreatha Martin, RN, BSN, [facility] 03/18/2018 11:33 AM

## 2018-04-02 ENCOUNTER — Telehealth: Payer: Self-pay | Admitting: Rehabilitation

## 2018-04-02 NOTE — Telephone Encounter (Signed)
LM for patient regarding return to PT or discharge.  If we don't hear from her in 2 weeks we will D/C the chart for now.

## 2018-04-15 ENCOUNTER — Other Ambulatory Visit: Payer: Self-pay | Admitting: General Surgery

## 2018-04-15 DIAGNOSIS — Z853 Personal history of malignant neoplasm of breast: Secondary | ICD-10-CM

## 2018-04-25 ENCOUNTER — Other Ambulatory Visit: Payer: Self-pay | Admitting: General Surgery

## 2018-04-25 DIAGNOSIS — Z853 Personal history of malignant neoplasm of breast: Secondary | ICD-10-CM

## 2018-04-25 DIAGNOSIS — N63 Unspecified lump in unspecified breast: Secondary | ICD-10-CM

## 2018-04-28 ENCOUNTER — Other Ambulatory Visit: Payer: Self-pay | Admitting: Adult Health

## 2018-04-28 ENCOUNTER — Other Ambulatory Visit: Payer: Self-pay | Admitting: Hematology and Oncology

## 2018-04-28 DIAGNOSIS — Z853 Personal history of malignant neoplasm of breast: Secondary | ICD-10-CM

## 2018-04-28 DIAGNOSIS — N63 Unspecified lump in unspecified breast: Secondary | ICD-10-CM

## 2018-04-29 ENCOUNTER — Other Ambulatory Visit: Payer: Self-pay | Admitting: General Surgery

## 2018-04-29 ENCOUNTER — Other Ambulatory Visit: Payer: Self-pay | Admitting: Hematology and Oncology

## 2018-04-29 ENCOUNTER — Ambulatory Visit
Admission: RE | Admit: 2018-04-29 | Discharge: 2018-04-29 | Disposition: A | Discharge status: Home / Self Care | Payer: BLUE CROSS/BLUE SHIELD | Source: Ambulatory Visit | Attending: General Surgery | Admitting: General Surgery

## 2018-04-29 DIAGNOSIS — Z853 Personal history of malignant neoplasm of breast: Secondary | ICD-10-CM

## 2018-04-29 DIAGNOSIS — N63 Unspecified lump in unspecified breast: Secondary | ICD-10-CM

## 2018-06-06 ENCOUNTER — Encounter: Payer: Self-pay | Admitting: Hematology and Oncology

## 2018-06-06 ENCOUNTER — Inpatient Hospital Stay: Payer: BLUE CROSS/BLUE SHIELD | Attending: Hematology and Oncology | Admitting: *Deleted

## 2018-06-06 DIAGNOSIS — C50511 Malignant neoplasm of lower-outer quadrant of right female breast: Secondary | ICD-10-CM

## 2018-06-06 DIAGNOSIS — Z170 Estrogen receptor positive status [ER+]: Principal | ICD-10-CM

## 2018-06-06 MED ORDER — INV-ASPIRIN/PLACEBO 300 MG TABS ALLIANCE A011502: 1.0000 | ORAL_TABLET | Freq: Every day | ORAL | 0 refills | Status: DC

## 2018-06-06 NOTE — Research (Signed)
ABC Month 4 - Re-supply  06/06/18 Patient Angelica Floyd arrives unaccompanied for the purpose of picking up a re-supply of the investigational medication for the ABC trial (300mg  aspirin versus placebo). The patient was previously dispensed only a 90 day supply of investigational drug, due to the investigational supply expiration date upcoming in the middle of the patient's visit window.    Patient returned her drug diaries for month 1-3; and reports missing only one date.  Patient did not return the medication bottle from the 90 day supply as the patient had a few pills remaining.   The purpose of this visit was for re-supply ONLY, therefore vital signs were not collected, and patient was not examined.  However, patient did report that she "feels no different," and she denies the presence of any of the solicited adverse events (ICH, GI bleed, epistaxis, hematuria, dyspepsia, bruising).  The patient was dispensed one bottle of 200 tablets, bottle number [id number] (1 of 3).    Patient asks if she will have any lab work completed on the trial, and this RN confirmed that labwork will just be routine lab work, if ordered by Dr. Lindi Adie.  The patient denies any other questions or concerns.  This RN to contact patient around mid April to schedule her 6 month visit.  Patient was thanked for her ongoing participation in the [place] trial.  Doreatha Martin, RN, BSN, [facility] 06/06/2018 9:29 AM

## 2018-08-26 ENCOUNTER — Telehealth: Payer: Self-pay | Admitting: *Deleted

## 2018-08-26 NOTE — Telephone Encounter (Signed)
ABC Study  08/26/18 11:33 AM Called patient Angelica Floyd; no answer so left voicemail.  Informed patient that number would come up as blocked or anonymous due to this RN working remotely for the time being.  Need to schedule her visit for ABC, preferably near the end of May.  Will call back to schedule a tentative visit.  Doreatha Martin, RN, BSN, [facility] 08/26/2018 11:34 AM

## 2018-08-26 NOTE — Telephone Encounter (Signed)
ABC  08/26/18 4:21 PM  This RN spoke with patient Angelica Floyd.  She reports doing well. This RN reviewed the changes to patient visits due to COVID-19.  Discussed that her visit is due around end of April, and can be extended to near the end of May.  Patient confirms that May 21, morning, will be an acceptable appointment time.  This RN placed a scheduling request. This RN discussed that nearer to the visit date, it will be determined if the visit needs to be conducted remotely in order to prevent the patient from having an on site visit at a health care facility.  Patient verbalizes understanding and is in agreement with this plan.  Patient confirms that she has remaining drug (asa or placebo).  Doreatha Martin, RN, BSN, [facility] 08/26/2018 4:24 PM

## 2018-09-25 NOTE — Assessment & Plan Note (Signed)
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 adjuvant chemotherapy with dose dense Adriamycin and Cytoxan x4 followed by Taxol weekly x12completed 07/25/17 3. Adjuvant radiation4/9/19- 10/02/17 4. Adjuvant antiestrogen therapy with tamoxifen (which was originally started prior to surgery)switched to letrozole to be started 10/26/2017 ABC clinical trial: Aspirin versus placebo started January 2020 ---------------------------------------------------------------------- Patient is menopausal based upon the blood work done in March. Previously she tolerated Tamoxifen extremely well.  Letrozoletoxicities: Muscle stiffness and achiness: She is on turmeric but it has not been helping her.  ABC clinical trial related toxicities: None Return to clinic in 3 months with follow-up 

## 2018-09-30 ENCOUNTER — Telehealth: Payer: Self-pay | Admitting: Hematology and Oncology

## 2018-09-30 NOTE — Telephone Encounter (Signed)
Spoke with pt re 10/02/18 office visit and she agrees to [place] visit. Pt aware to be near her cell phone 5/10 min prior to appt.

## 2018-10-01 NOTE — Progress Notes (Signed)
HEMATOLOGY-ONCOLOGY DOXIMITY VISIT PROGRESS NOTE  I connected with Angelica Floyd on 10/02/2018 at  8:45 AM EDT by Doximity video conference and verified that I am speaking with the correct person using two identifiers.  I discussed the limitations, risks, security and privacy concerns of performing an evaluation and management service by Doximity and the availability of in person appointments.  I also discussed with the patient that there may be a patient responsible charge related to this service. The patient expressed understanding and agreed to proceed.  Patient's Location: Home Physician Location: Clinic  CHIEF COMPLIANT: Follow-up on letrozole therapy  INTERVAL HISTORY: Angelica Floyd is a 53 y.o. female with above-mentioned history of right breast cancer treated with bilateral mastectomies, right axillary lymph node dissection, adjuvant chemotherapy, radiation, and is currently on antiestrogen therapy with letrozole. She is a participant in the ABC clinical trial. US of the left axilla on 04/29/18 after the patient palpated a lump near her port site showed no evidence of malignancy. She presents today over Doximity for 73-monthfollow-up.     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%, PR 95%, HER-2 negative ratio 1.31, Ki-67 3%, 4/4 lymph nodes positive; left mastectomy: PASH and FC changes, no malignancy; T2 N2,  stage IIA AJCC 8    02/14/2017 Surgery    Right axillary lymph node dissection 8/11 lymph  nodes positive    03/06/2017 - 07/25/2017 Chemotherapy    Dose dense AC x4 followed by Taxol x12    08/20/2017 - 10/02/2017 Radiation Therapy    Adj XRT    10/2017 -  Anti-estrogen oral therapy    Letrozole daily     REVIEW OF SYSTEMS:   Constitutional: Denies fevers, chills or abnormal weight loss Eyes: Denies blurriness of vision Ears, nose, mouth, throat, and face: Denies mucositis or sore throat Respiratory: Denies cough, dyspnea or wheezes Cardiovascular: Denies palpitation, chest discomfort Gastrointestinal:  Denies nausea, heartburn or change in bowel habits Skin: Denies abnormal skin rashes Lymphatics: Denies new lymphadenopathy or easy bruising Neurological:Denies numbness, tingling or new weaknesses Behavioral/Psych: Mood is stable, no new changes  Extremities: No lower extremity edema Breast: denies any pain or lumps or nodules in either breasts All other systems were reviewed with the patient and are negative.  Observations/Objective:  There were no vitals filed for this visit. There is no height or weight on file to calculate BMI.  I have reviewed the data as listed CMP Latest Ref Rng & Units 07/25/2017 07/18/2017 07/11/2017  Glucose 70 - 140 mg/dL 103 101 106  BUN 7 - 26 mg/dL _0 Creatinine 0.60 - 1.10 mg/dL 0.83 0.87 0.84  Sodium 136 - 145 mmol/L 140 139 139  Potassium 3.5 - 5.1 mmol/L 3.8 3.6 3.8  Chloride 98 - 109 mmol/L 105 104 104  CO2 22 - 29 mmol/L _1 Calcium 8.4 - 10.4 mg/dL 9.7 9.6 9.7  Total Protein 6.4 - 8.3 g/dL 6.7 6.7 6.7  Total Bilirubin 0.2 - 1.2  mg/dL 0.3 0.4 0.3  Alkaline Phos 40 - 150 U/L 54 57 63  AST 5 - 34 U/L _0 ALT 0 - 55 U/L _1 Lab Results  Component Value Date   WBC 3.1 (L) 07/25/2017   HGB 12.0 07/25/2017   HCT 35.0 07/25/2017   MCV 92.6 07/25/2017   PLT 233 07/25/2017   NEUTROABS 1.8 07/25/2017      Assessment Plan:  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, estrogen  receptor positive ([place]) 01/28/2017: Bilateral mastectomies: Right: 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 adjuvant chemotherapy with dose dense Adriamycin and Cytoxan x4 followed by Taxol weekly x12completed 07/25/17 3. Adjuvant radiation4/9/19- 10/02/17 4. Adjuvant antiestrogen therapy with tamoxifen (which was originally started prior to surgery)switched to letrozole to be started 10/26/2017 ABC clinical trial: Aspirin versus placebo started January 2020 ---------------------------------------------------------------------- Patient is menopausal based upon the blood work done in March. Previously she tolerated Tamoxifen extremely well.  Letrozole toxicities: Muscle stiffness and achiness: She is on turmeric but it has not been helping her.  ABC clinical trial related toxicities: None Return to clinic in 3 months with follow-up  I discussed the assessment and treatment plan with the patient. The patient was provided an opportunity to ask questions and all were answered. The patient agreed with the plan and demonstrated an understanding of the instructions. The patient was advised to call back or seek an in-person evaluation if the symptoms worsen or if the condition fails to improve as anticipated.   I provided 15 minutes of face-to-face Doximity time during this encounter.    Rulon Eisenmenger, MD 10/02/2018   I, Molly Dorshimer, am acting as scribe for Nicholas Lose, MD.  I have reviewed the above documentation for accuracy and completeness, and I agree with the above.

## 2018-10-02 ENCOUNTER — Inpatient Hospital Stay: Payer: BLUE CROSS/BLUE SHIELD | Attending: Hematology and Oncology | Admitting: Hematology and Oncology

## 2018-10-02 ENCOUNTER — Telehealth: Payer: Self-pay | Admitting: Hematology and Oncology

## 2018-10-02 ENCOUNTER — Encounter: Payer: Self-pay | Admitting: *Deleted

## 2018-10-02 DIAGNOSIS — C50511 Malignant neoplasm of lower-outer quadrant of right female breast: Secondary | ICD-10-CM

## 2018-10-02 DIAGNOSIS — Z170 Estrogen receptor positive status [ER+]: Secondary | ICD-10-CM | POA: Diagnosis not present

## 2018-10-02 DIAGNOSIS — Z9221 Personal history of antineoplastic chemotherapy: Secondary | ICD-10-CM

## 2018-10-02 DIAGNOSIS — Z006 Encounter for examination for normal comparison and control in clinical research program: Secondary | ICD-10-CM | POA: Diagnosis not present

## 2018-10-02 DIAGNOSIS — Z9013 Acquired absence of bilateral breasts and nipples: Secondary | ICD-10-CM

## 2018-10-02 DIAGNOSIS — Z923 Personal history of irradiation: Secondary | ICD-10-CM

## 2018-10-02 DIAGNOSIS — Z79811 Long term (current) use of aromatase inhibitors: Secondary | ICD-10-CM

## 2018-10-02 MED ORDER — LETROZOLE 2.5 MG PO TABS: 2.5000 mg | ORAL_TABLET | Freq: Every day | ORAL | 3 refills | Status: DC

## 2018-10-02 NOTE — Research (Signed)
ABC Trial  10/02/18 8:21 AM  This RN called patient Angelica Floyd today for the purpose of her 6 month visit on the ABC trial.  Due to COVID-19 precautions, the in person visit was completed virtually.  No vital signs, or height and weight are to be collected for this visit.  Concomitant Medications - Patient's medication list was reviewed with the patient; she is now using lorazepam only on a PRN basis, instead of daily at bedtime.  She confirms she has only used tylenol PRN during the past 6 months, she denies any other NSAID use.  Investigational Medication - Due to dispensing of 200 tablets at approximately month 3, the patient confirmed she has plenty of tablets remaining.  No need to dispense medication today.  She confirms that she is taking the medication daily, per protocol.  This RN is to mail the patient additional diaries, and an envelop for her to return completed medication diaries.  Patient mentioned that she also tracks her dosing on an app and may email this RN an excel document with the same information.  Adverse Events and Solicited AEs:  Patient denies any changes in her health.  She confirms she is tolerating the medication well.  She denies experiencing any of the solicited AEs: ICH, GI bleed, epistaxis, hematuria, dyspepsia, bruising.  H&P with ECOG and Disease Assessment:  Patient is scheduled to complete virtual visit with Dr. Lindi Adie today.  Please see his documentation from that visit.  Visit Scheduling - The patient was thanked for her ongoing participation in the ABC trial.  She will be conducted in the upcoming months to determine the best method for dispensing her additional ABC investigational medication (300mg  ASA/placebo) considering the COVID-19 situation.    Doreatha Martin, RN, BSN, [facility] 10/02/2018 8:29 AM

## 2018-12-10 ENCOUNTER — Telehealth: Payer: Self-pay | Admitting: *Deleted

## 2018-12-10 NOTE — Telephone Encounter (Signed)
ABC Trial  12/10/18  Left voicemail for patient regarding making arrangements for her trial drug resupply.  Requested call back; contact information provided.  Will attempt to contact patient again.  Doreatha Martin, RN, BSN, [facility] 12/10/2018 4:34 PM

## 2018-12-12 NOTE — Telephone Encounter (Signed)
12/12/18 This RN called patient Angelica Floyd.  She will come to the [place] on Tuesday, 12/16/18, to pick up her next bottle of study medication on the ABC trial.  She also plans to bring her June and July medication diary.  Feb-May 2020 diaries previously received in the mail from the patient.    Doreatha Martin, RN, BSN, [facility] 12/12/2018 4:36 PM

## 2018-12-16 ENCOUNTER — Encounter: Payer: BLUE CROSS/BLUE SHIELD | Admitting: *Deleted

## 2018-12-18 ENCOUNTER — Other Ambulatory Visit: Payer: Self-pay

## 2018-12-18 ENCOUNTER — Inpatient Hospital Stay: Payer: BC Managed Care – PPO | Attending: Hematology and Oncology | Admitting: *Deleted

## 2018-12-18 ENCOUNTER — Encounter: Payer: Self-pay | Admitting: Hematology and Oncology

## 2018-12-18 DIAGNOSIS — Z170 Estrogen receptor positive status [ER+]: Secondary | ICD-10-CM

## 2018-12-18 DIAGNOSIS — C50511 Malignant neoplasm of lower-outer quadrant of right female breast: Secondary | ICD-10-CM

## 2018-12-18 MED ORDER — INV-ASPIRIN/PLACEBO 300 MG TABS ALLIANCE A011502: 1.0000 | ORAL_TABLET | Freq: Every day | ORAL | 0 refills | Status: DC

## 2018-12-18 NOTE — Research (Signed)
ABC - Re-supply  12/18/18 Patient Angelica Floyd arrives unaccompanied for the purpose of picking up a re-supply of the investigational medication for the ABC trial (300mg  aspirin versus placebo). The patient was previously dispensed a 200 day supply of investigational drug on 06/06/18.  She was not dispensed a second 200 day  supply bottle at her 6 month study visit due to the visit being conducted remotely for COVID-19 precautions.  The purpose of this visit was for re-supply ONLY, therefore vital signs were not collected, and patient was not examined.  However, patient did report "everything is normal" and she denies the presence of any of the solicited adverse events (ICH, GI bleed, epistaxis, hematuria, dyspepsia, bruising).  No indication to reduce dosing.  Patient continues at 300mg  daily dose.  The patient was dispensed one bottle of 200 tablets, bottle number [id number] (2 of 3).    Patient previously mailed in completed diaries through May 2020, and patient today returns completed drug diaries through July 2020.  Notes missing only one dose since last dispensing (06/06/18) on diary. The patient returned the medication bottle [id number], with 16 pills remaining which is more than expected for only one missed day of dosing (anticipate 7 pills remaining).  However patient did not return initial 90 count bottle, which, per patient, had pills remaining at time of dispensing 06/06/18 so she did not start dosing from bottle [id number] #1/3 until the 90 count bottle was complete.  Even if patient did miss 9 doses, then her compliance with the medication was 95% and the patient demonstrates compliance with the dosing regimen.  The patient denies any questions or concerns.  Patient will be contacted to schedule her 12 month visit, due around the end of October 2020.  Doreatha Martin, RN, BSN, [facility] 12/18/2018 9:54 AM

## 2019-02-18 ENCOUNTER — Ambulatory Visit: Payer: Self-pay

## 2019-02-18 ENCOUNTER — Encounter: Payer: Self-pay | Admitting: Orthopaedic Surgery

## 2019-02-18 ENCOUNTER — Ambulatory Visit (INDEPENDENT_AMBULATORY_CARE_PROVIDER_SITE_OTHER): Payer: BC Managed Care – PPO | Admitting: Orthopaedic Surgery

## 2019-02-18 DIAGNOSIS — M25511 Pain in right shoulder: Secondary | ICD-10-CM

## 2019-02-18 DIAGNOSIS — M7541 Impingement syndrome of right shoulder: Secondary | ICD-10-CM

## 2019-02-18 MED ORDER — METHYLPREDNISOLONE ACETATE 40 MG/ML IJ SUSP
40.0000 mg | INTRAMUSCULAR | Status: AC | PRN
  Administered 2019-02-18: 09:00:00 40 mg via INTRA_ARTICULAR

## 2019-02-18 MED ORDER — LIDOCAINE HCL 1 % IJ SOLN
3.0000 mL | INTRAMUSCULAR | Status: AC | PRN
  Administered 2019-02-18: 3 mL

## 2019-02-18 NOTE — Progress Notes (Signed)
Office Visit Note   Patient: Angelica Floyd           Date of Birth: 22-Mar-1966           MRN: [id number] Visit Date: 02/18/2019              Requested by: Jettie Booze, NP [place],  [place] [id number] PCP: Jettie Booze, NP   Assessment & Plan: Visit Diagnoses:  1. Right shoulder pain, unspecified chronicity   2. Impingement syndrome of right shoulder     Plan: I did talk to her about a steroid injection in the subacromial space and she agreed to this.  I explained the risk minutes of injections.  Examination of both shoulders shows the same function of both shoulders in terms of the clicking other than there is pain and stiffness on the right side.  She did tolerate a steroid injection well.  All question concerns were answered and addressed.  I talked her about her diagnosis of impingement syndrome and what that means..  I would like to see her back in about 8 weeks to see if she would like to consider repeat injection if needed.  We can always consider therapy to if needed.  Follow-Up Instructions: Return in about 8 weeks (around 04/15/2019).   Orders:  Orders Placed This Encounter  Procedures  . Large Joint Inj  . XR Shoulder Right   No orders of the defined types were placed in this encounter.     Procedures: Large Joint Inj: R subacromial bursa on 02/18/2019 9:26 AM Indications: pain and diagnostic evaluation Details: 22 G 1.5 in needle  Arthrogram: No  Medications: 3 mL lidocaine 1 %; 40 mg methylPREDNISolone acetate 40 MG/ML Outcome: tolerated well, no immediate complications Procedure, treatment alternatives, risks and benefits explained, specific risks discussed. Consent was given by the patient. Immediately prior to procedure a time out was called to verify the correct patient, procedure, equipment, support staff and site/side marked as required. Patient was prepped and draped in the usual sterile fashion.       Clinical Data: No  additional findings.   Subjective: Chief Complaint  Patient presents with  . Right Shoulder - Pain  The patient is a very pleasant 53 year old female who comes in with chronic right shoulder pain and stiffness.  She says overhead throwing motions cause a lot of sharp pain in her shoulder.  She does have a history of a mastectomy on that side 2 years ago.  She is sent by Dr. Marlou Starks from [facility] surgery to evaluate her right shoulder.  She did have to get the significant therapy for cords in the shoulder and axillary region after her surgery.  She says her motion is not an issue but is more of just the pain with some of her activities.  She says her range of motion has decreased some but is more of a tight feeling in her shoulder and some catching sensations.  She denies any specific injury.  She has been trying to get into tennis with her son and that is been hard for her today.  She denies any neck pain and denies any numbness and tingling in her right hand.  HPI  Review of Systems She currently denies any headache, chest pain, shortness of breath, fever, chills, nausea, vomiting  Objective: Vital Signs: There were no vitals taken for this visit.  Physical Exam She is alert and orient x3 and in no  acute distress Ortho Exam Examination of her right shoulder does show excellent range of motion and an intact rotator cuff.  However there is stiffness and pain with overhead motion.  Her rotator cuff feels strong.  She does have some signs of impingement. Specialty Comments:  No specialty comments available.  Imaging: Xr Shoulder Right  Result Date: 02/18/2019 3 views of the right shoulder show no acute findings.  The shoulder is well located and there is good space of the [facility] joint.    PMFS History: Patient Active Problem List   Diagnosis Date Noted  . Cancer of central portion of right female breast ([place]) 01/28/2017  . 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 of  female, estrogen receptor positive ([place]) 01/08/2017   Past Medical History:  Diagnosis Date  . Anxiety    started at the time of her breast cancer diagnosis (August 2018)  . High cholesterol    Per patient 02/19/18: Her LDL was 100, but HDL was elevated; no intervention at this time.  Marland Kitchen History of chemotherapy    finished chemo 07/29/2017  . Hot flashes started after letrozole dosing began   per patient 02/19/18  . Hypocalcemia    per patient 02/19/18: Her recent Calcium level was elevated per her PCP, so she takes only one calcium supplment daily (historically she took 2)  . Insomnia started after breast cancer surgery   Per 02/19/18: due to anxiety and hot flashes  . Joint pain started at time of letrozole start   per patient 02/19/18  . 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 Seasonal allergies    per patient 02/19/18: She has been experiencing for at least 20 years    History reviewed. No pertinent family history.  Past Surgical History:  Procedure Laterality Date  . ABDOMINAL HYSTERECTOMY  2015   partial; ovaires remain  . ADENOIDECTOMY W/ MYRINGOTOMY  at age 58  . APPENDECTOMY     2015  . AXILLARY LYMPH NODE DISSECTION Right 02/14/2017   Procedure: RIGHT AXILLARY LYMPH NODE DISSECTION ERAS PATHWAY;  Surgeon: Jovita Kussmaul, MD;  Location: [place];  Service: General;  Laterality: Right;  . CESAREAN SECTION  2005  . MASTECTOMY W/ SENTINEL NODE BIOPSY Bilateral 01/28/2017   RIGHT BREAST BIOPSY  . MASTECTOMY W/ SENTINEL NODE BIOPSY Bilateral 01/28/2017   Procedure: BILATERAL MASTECTOMY WITH RIGHT SENTINEL LYMPH NODE BIOPSY;  Surgeon: Jovita Kussmaul, MD;  Location: [place];  Service: General;  Laterality: Bilateral;  . PORT-A-CATH REMOVAL Left 08/08/2017   Procedure: REMOVAL PORT-A-CATH;  Surgeon: Jovita Kussmaul, MD;  Location: [place];  Service: General;  Laterality: Left;  . PORTACATH PLACEMENT  Left 02/14/2017   Procedure: INSERTION PORT-A-CATH;  Surgeon: Jovita Kussmaul, MD;  Location: [place];  Service: General;  Laterality: Left;  . RE-EXCISION OF BREAST CANCER,SUPERIOR MARGINS N/A 02/14/2017   Procedure: RE-EXCISION INFERIOR FLAP;  Surgeon: Jovita Kussmaul, MD;  Location: [place];  Service: General;  Laterality: N/A;  . WISDOM TOOTH EXTRACTION  at age 33   Social History   Occupational History  . Not on file  Tobacco Use  . Smoking status: Never Smoker  . Smokeless tobacco: Never Used  Substance and Sexual Activity  . Alcohol use: Not Currently  . Drug use: No  . Sexual activity: Not on file

## 2019-02-27 ENCOUNTER — Telehealth: Payer: Self-pay | Admitting: Medical Oncology

## 2019-02-27 NOTE — Telephone Encounter (Signed)
[id number] [facility]): 12 month visit Call to patient to introduce myself and spoke with her regarding her 30-month study visit and to see MD. I inquired with patient if she had a preference for time and day. Patient states first week of November, any day, morning appointment works for her. I informed patient of what this study visit would entail and patient knows that I will call her with the appointment time for this visit. Patient denies any questions at this time. Patient thanked for her time and encouraged to call with questions in the meantime.  Maxwell Marion, RN, BSN, [facility] Clinical Research 02/27/2019 2:05 PM

## 2019-03-02 ENCOUNTER — Telehealth: Payer: Self-pay | Admitting: Hematology and Oncology

## 2019-03-02 NOTE — Telephone Encounter (Signed)
Scheduled  appt per 10/16 sch message - pt is aware of appt date and time

## 2019-03-15 NOTE — Progress Notes (Signed)
Patient Care Team: Jettie Booze, NP as PCP - General (Family Medicine) Nicholas Lose, MD as Consulting Physician (Hematology and Oncology) Jovita Kussmaul, MD as Consulting Physician (General Surgery) Gery Pray, MD as Consulting Physician (Radiation Oncology) Gardenia Phlegm, NP as Nurse Practitioner (Hematology and Oncology)  DIAGNOSIS:    ICD-10-CM   1. 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, estrogen receptor positive ([place])  01/02/2017 Initial Diagnosis   Palpable right breast mass retroareolar 6:30 position: 3.6 cm size axilla negative, 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 HER-2 negative ratio 1.31 Ki-67 3% in addition calcifications UIQ 1.4 cm stereotactic biopsy flat epithelial atypia; clips are 4.3 cm apart, T2 N0 stage IB clinical stage AJCC 8   01/28/2017 Surgery   Bilateral mastectomies: Right: Grade 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%, PR 95%, HER-2 negative ratio 1.31, Ki-67 3%, 4/4 lymph nodes positive; left mastectomy: PASH and FC changes, no malignancy; T2 N2,  stage IIA AJCC 8   02/14/2017 Surgery   Right axillary lymph node dissection 8/11 lymph nodes positive   03/06/2017 - 07/25/2017 Chemotherapy   Dose dense AC x4 followed by Taxol x12   08/20/2017 - 10/02/2017 Radiation Therapy   Adj XRT   10/2017 -  Anti-estrogen oral therapy   Letrozole daily     CHIEF COMPLIANT: Follow-up of right breast cancer on letrozole therapy  INTERVAL HISTORY: Angelica BRANIFF is a 53 y.o. with above-mentioned history of right breast cancer treated with bilateral mastectomies, right axillary lymph node dissection, adjuvant chemotherapy, radiation, and is currently on antiestrogen therapy with letrozole. She is a participant in the ABC clinical trial. She presents to the  clinic today for follow-up.  Mild hot flashes and mild joint stiffness but tolerating it fairly well. Aspirin trial has not caused any bruising or bleeding symptoms.  REVIEW OF SYSTEMS:   Constitutional: Denies fevers, chills or abnormal weight loss Eyes: Denies blurriness of vision Ears, nose, mouth, throat, and face: Denies mucositis or sore throat Respiratory: Denies cough, dyspnea or wheezes Cardiovascular: Denies palpitation, chest discomfort Gastrointestinal: Denies nausea, heartburn or change in bowel habits Skin: Denies abnormal skin rashes Lymphatics: Denies new lymphadenopathy or easy bruising Neurological: Denies numbness, tingling or new weaknesses Behavioral/Psych: Mood is stable, no new changes  Extremities: No lower extremity edema Breast: Chest tightness of bilateral mastectomies but no nodules. All other systems were reviewed with the patient and are negative.  I have reviewed the past medical history, past surgical history, social history and family history with the patient and they are unchanged from previous note.  ALLERGIES:  has No Known Allergies.  MEDICATIONS:  Current Outpatient Medications  Medication Sig Dispense Refill  . acetaminophen (TYLENOL) 325 MG tablet Take 650 mg by mouth every 6 (six) hours as needed.    . Black Pepper-Turmeric (TURMERIC COMPLEX/BLACK PEPPER PO) Take 1 capsule by mouth daily.    . calcium-vitamin D (OSCAL WITH D) 500-200 MG-UNIT tablet Take 1 tablet by mouth.     . Investigational aspirin/placebo 300 MG tablet Alliance [id number] Take 1 tablet by mouth daily. Take with food or a full glass of water.  Do not crush enteric coated tablets. 200 tablet 0  . ketotifen (ZADITOR) 0.025 % ophthalmic solution Place 2 drops into both eyes as  needed (started using as needed for seasonal allergies in 2014).    Marland Kitchen letrozole (FEMARA) 2.5 MG tablet Take 1 tablet (2.5 mg total) by mouth daily. 90 tablet 3  . LORazepam (ATIVAN) 0.5 MG tablet Take 1  tablet (0.5 mg total) by mouth at bedtime. (Patient taking differently: Take 0.5 mg by mouth at bedtime. ) 30 tablet 0  . Multiple Vitamin (MULTIVITAMIN) tablet Take 1 tablet by mouth daily.     No current facility-administered medications for this visit.     PHYSICAL EXAMINATION: ECOG PERFORMANCE STATUS: 1 - Symptomatic but completely ambulatory  Vitals:   03/16/19 1036  BP: 130/88  Pulse: 79  Resp: 20  Temp: 97.8 F (36.6 C)  SpO2: 100%   Filed Weights   03/16/19 1036  Weight: 129 lb 9.6 oz (58.8 kg)    GENERAL: alert, no distress and comfortable SKIN: skin color, texture, turgor are normal, no rashes or significant lesions BREAST: No palpable masses or nodules in bilateral chest wall or axilla.  (exam performed in the presence of a chaperone)  LABORATORY DATA:  I have reviewed the data as listed CMP Latest Ref Rng & Units 07/25/2017 07/18/2017 07/11/2017  Glucose 70 - 140 mg/dL 103 101 106  BUN 7 - 26 mg/dL '12 9 9  ' Creatinine 0.60 - 1.10 mg/dL 0.83 0.87 0.84  Sodium 136 - 145 mmol/L 140 139 139  Potassium 3.5 - 5.1 mmol/L 3.8 3.6 3.8  Chloride 98 - 109 mmol/L 105 104 104  CO2 22 - 29 mmol/L '27 26 27  ' Calcium 8.4 - 10.4 mg/dL 9.7 9.6 9.7  Total Protein 6.4 - 8.3 g/dL 6.7 6.7 6.7  Total Bilirubin 0.2 - 1.2 mg/dL 0.3 0.4 0.3  Alkaline Phos 40 - 150 U/L 54 57 63  AST 5 - 34 U/L '22 23 24  ' ALT 0 - 55 U/L '23 27 26    ' Lab Results  Component Value Date   WBC 3.1 (L) 07/25/2017   HGB 12.0 07/25/2017   HCT 35.0 07/25/2017   MCV 92.6 07/25/2017   PLT 233 07/25/2017   NEUTROABS 1.8 07/25/2017    ASSESSMENT & PLAN:  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, estrogen receptor positive ([place]) 01/28/2017: Bilateral mastectomies: Right: Grade 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%, PR 95%, HER-2 negative ratio 1.31, Ki-67 3%, 4/4 lymph nodes positive; left mastectomy: PASH and FC changes, no malignancy; T2 N2, stage IIA AJCC 8 02-14-17: 8/10 lymph nodes  positive  CT chest 02/13/2017: 3 mm right middle lobe nodule likely benign benign cysts in the liver, ovarian cysts few small sclerotic lesions in the bone likely bone islands Bone scan 02/13/2017: No bone metastases  Treatment plan: 1. adjuvant chemotherapy with dose dense Adriamycin and Cytoxan x4 followed by Taxol weekly x12completed 07/25/17 3. Adjuvant radiation4/9/19- 10/02/17 4. Adjuvant antiestrogen therapy with tamoxifen (which was originally started prior to surgery)switched to letrozole to be started 10/26/2017 ABC clinical trial: Aspirin versus placebo started January 2020 ---------------------------------------------------------------------- Patient is menopausal based upon the blood work done in March. Previously she tolerated Tamoxifen extremely well.  Letrozoletoxicities: Muscle stiffness and achiness: She is on turmeric but it has not been helping her.  ABC clinical trial related toxicities: None Return to clinic in and get labs 6 months with follow-up    No orders of the defined types were placed in this encounter.  The patient has a good understanding of the overall plan. she agrees with it. she will call  with any problems that may develop before the next visit here.  Nicholas Lose, MD 03/16/2019  Julious Oka Dorshimer am acting as scribe for Dr. Nicholas Lose.  I have reviewed the above documentation for accuracy and completeness, and I agree with the above.

## 2019-03-16 ENCOUNTER — Encounter: Payer: Self-pay | Admitting: Medical Oncology

## 2019-03-16 ENCOUNTER — Inpatient Hospital Stay: Payer: BC Managed Care – PPO | Attending: Hematology and Oncology | Admitting: Hematology and Oncology

## 2019-03-16 ENCOUNTER — Other Ambulatory Visit: Payer: Self-pay

## 2019-03-16 ENCOUNTER — Telehealth: Payer: Self-pay | Admitting: Hematology and Oncology

## 2019-03-16 ENCOUNTER — Encounter: Payer: Self-pay | Admitting: Hematology and Oncology

## 2019-03-16 DIAGNOSIS — C50511 Malignant neoplasm of lower-outer quadrant of right female breast: Secondary | ICD-10-CM

## 2019-03-16 DIAGNOSIS — Z9013 Acquired absence of bilateral breasts and nipples: Secondary | ICD-10-CM | POA: Insufficient documentation

## 2019-03-16 DIAGNOSIS — Z923 Personal history of irradiation: Secondary | ICD-10-CM | POA: Insufficient documentation

## 2019-03-16 DIAGNOSIS — Z170 Estrogen receptor positive status [ER+]: Secondary | ICD-10-CM

## 2019-03-16 DIAGNOSIS — Z9221 Personal history of antineoplastic chemotherapy: Secondary | ICD-10-CM | POA: Insufficient documentation

## 2019-03-16 DIAGNOSIS — Z79899 Other long term (current) drug therapy: Secondary | ICD-10-CM | POA: Insufficient documentation

## 2019-03-16 DIAGNOSIS — R911 Solitary pulmonary nodule: Secondary | ICD-10-CM | POA: Insufficient documentation

## 2019-03-16 MED ORDER — VITAMIN D 25 MCG (1000 UNIT) PO TABS: 1000.0000 [IU] | ORAL_TABLET | Freq: Every day | ORAL | Status: AC

## 2019-03-16 MED ORDER — VITAMIN C 500 MG PO TABS: 500.0000 mg | ORAL_TABLET | Freq: Every day | ORAL | Status: AC

## 2019-03-16 MED ORDER — ZINC GLUCONATE 50 MG PO TABS: 50.0000 mg | ORAL_TABLET | Freq: Every day | ORAL | Status: DC

## 2019-03-16 NOTE — Assessment & Plan Note (Signed)
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 adjuvant chemotherapy with dose dense Adriamycin and Cytoxan x4 followed by Taxol weekly x12completed 07/25/17 3. Adjuvant radiation4/9/19- 10/02/17 4. Adjuvant antiestrogen therapy with tamoxifen (which was originally started prior to surgery)switched to letrozole to be started 10/26/2017 ABC clinical trial: Aspirin versus placebo started January 2020 ---------------------------------------------------------------------- Patient is menopausal based upon the blood work done in March. Previously she tolerated Tamoxifen extremely well.  Letrozoletoxicities: Muscle stiffness and achiness: She is on turmeric but it has not been helping her.  ABC clinical trial related toxicities: None Return to clinic in 3 months with follow-up 

## 2019-03-16 NOTE — Progress Notes (Signed)
ABC Trial: Month 12  Patient in clinic today for her 12 month on-study visit and H&P with Dr. Lindi Adie. Patient's vital signs collected and documented. I met with patient after her visit with Dr. Lindi Adie, who addressed patient's questions/concerns. Per MD, patient to continue with current dose.   Concomitant Medications - Patient's medication list was reviewed with the patient and she denies any new medications. Patient confirms no non-protocol use of aspirin or NSAIDs used during this reporting period.   Investigational Medication - Today, patient brought with her the previously dispensed study medication bottle , that was dispensed to her by research nurse, Doreatha Martin on 12/18/2018. This bottle (Rx [phone]) was taken to pharmacy for count and count verified by PharmD Raul Del that there was 116 tablets remaining. Patient was returned this bottle back to continue with use and dispensed a new #200 pill study medication bottle (Rx [id number] exp 04/12/20, 300 mg), so that she will not run out of study drug prior to her next six month visit, which should occur sometime at the end of April or early May of 2021. Patient knows to start this newly dispensed bottle after she has completed the #116 pills she has remaining from the 12/18/2018 dispense.   Aspirin/Placebo Medication Log: Patient provided me today with her medication log starting from 12/13/2018- 03/14/2019. Patient reports one missed dose on 02/24/2019. Patient had previously provided her completed medication logs from 09/07/2018 - 12/12/2018 at an earlier visit. Today, patient was provided with medication logs to start with November through April (2021).  Patient has been dispensed 490 pills (not including today's dispense), since the start of study. With patient's return today of 116 pills, per count, and per patient's medication log from the start of study on 03/11/2018- 03/14/2019= 368 days (490- 116 = 374), with patient reporting a total of  360 aspirin/placebo taken and 8 missed days, per patient's med log and pill count, patient has demonstrated at least 95% compliance.   Adverse Events and Solicited AEs:  Patient denies any changes in her health.  She confirms she is tolerating the medication well.  She denies experiencing any of the solicited AEs: ICH, GI bleed, epistaxis, hematuria, dyspepsia, bruising. No indication to reduce dose and patient will continue at her current dose of 300 mg.  H&P with ECOG and Disease Assessment:  Patient seen by Dr. Lindi Adie today.  Please see his documentation from that visit.  Plan/Visit Scheduling - Patient was thanked today for her time and ongoing support of study. Patient was informed that her next visit will be schedule for approximately 6 months from today (April/May 2021). All patient's questions answered to her satisfaction and patient was encouraged to call Dr. Lindi Adie or myself with any questions/concerns she may have. My contact information was provided to patient.   Maxwell Marion, RN, BSN, [facility] Clinical Research 03/16/2019 1:08 PM

## 2019-03-16 NOTE — Telephone Encounter (Signed)
I talk with patient regarding schedule  

## 2019-04-04 IMAGING — MR MR BREAST BIOPSY
10 of 13 series · 33 of 48 positions shown · IV contrast (11ml Multihance)
Comparison: Previous exams, including recent breast MRI dated
01/16/2017.

ADDENDUM:
Pathology revealed fibrocystic changes with adenosis and
calcifications in the LEFT breast. This was found to be concordant
by Dr. Rudi Jumper. Pathology results were discussed with the
patient by telephone. The patient reported doing well after the
biopsy. Post biopsy instructions and care were reviewed and
questions were answered. The patient was encouraged to call The
patient has a recent diagnosis of RIGHT breast cancer and should
follow her outlined treatment plan.

Pathology results reported by Volodymyr Marius RN, BSN on 01/24/2017.
CLINICAL DATA: 51-year-old female with an indeterminate enhancing
mass in the upper-outer posterior left breast seen on recent breast
MRI. Recent diagnosis of right breast cancer.
EXAM:
MRI GUIDED CORE NEEDLE BIOPSY OF THE LEFT BREAST
TECHNIQUE: Multiplanar, multisequence MR imaging of the left breast was
performed both before and after administration of intravenous
contrast.
CONTRAST:  11 mL of MultiHance.

[Series 2: axial pre-cm · axial · non-contrast · 1.3mm · 0.73mm/px · z∈[-77,+109]mm · 3 of 144 slices shown]
[im 1/144]
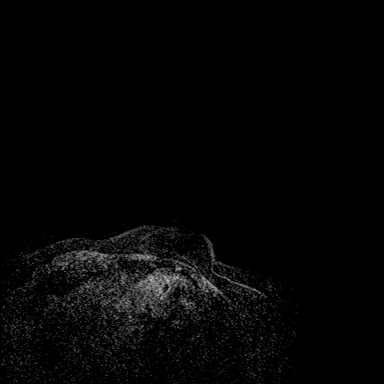
[im 72/144]
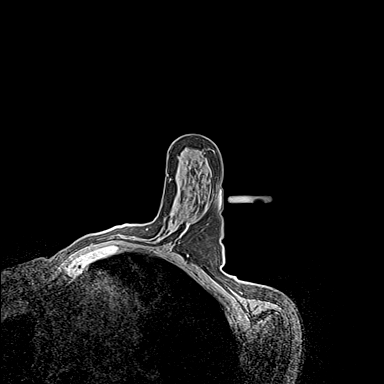
[im 144/144]
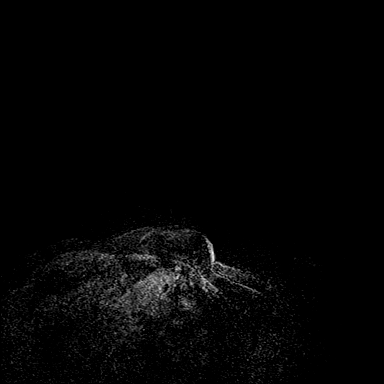

[Series 3: axial pre-cm_s2_(id) · axial · non-contrast · 1.3mm · 0.73mm/px · z∈[-77,+109]mm · 3 of 144 slices shown]
[im 1/144]
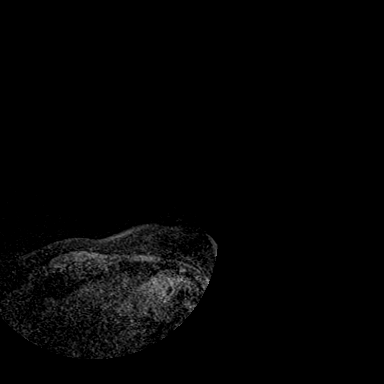
[im 72/144]
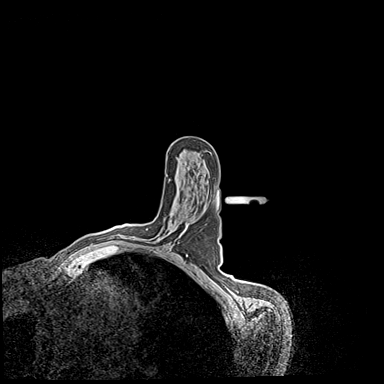
[im 144/144]
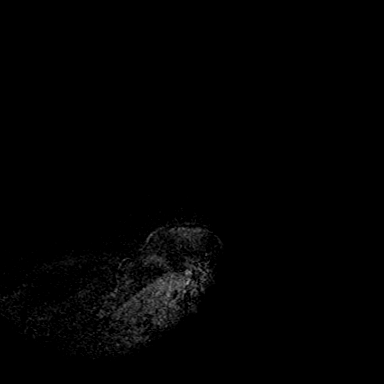

[Series 4: axial post 20 · axial · 1.3mm · 0.73mm/px · z∈[-77,+109]mm · 3 of 144 slices shown (1 of 3)]
[im 1/144]
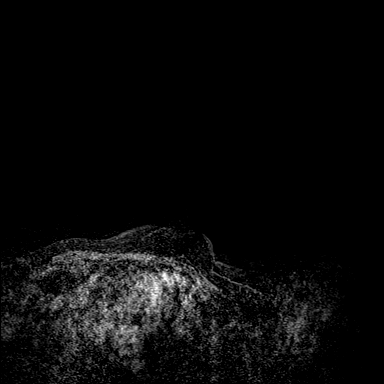
[im 72/144]
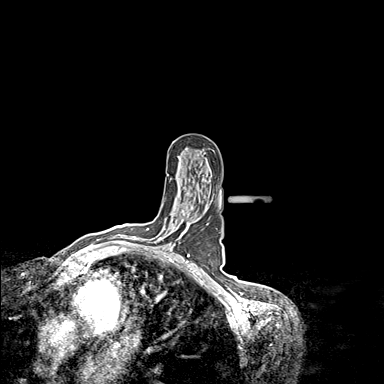
[im 144/144]
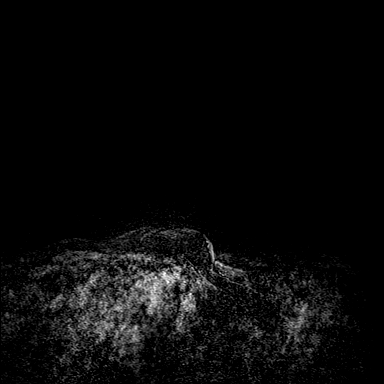

[Series 5: axial post 20 · axial · 1.3mm · 0.73mm/px · z∈[-77,+109]mm · 3 of 144 slices shown (2 of 3)]
[im 1/144]
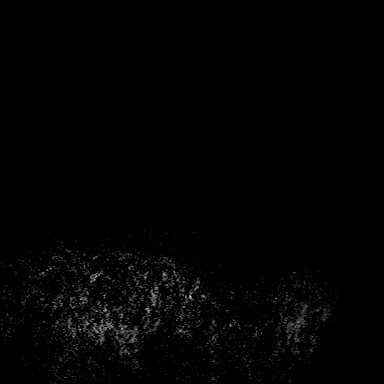
[im 72/144]
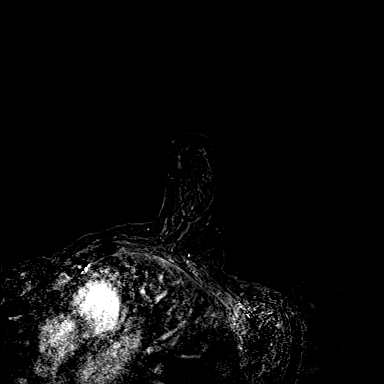
[im 144/144]
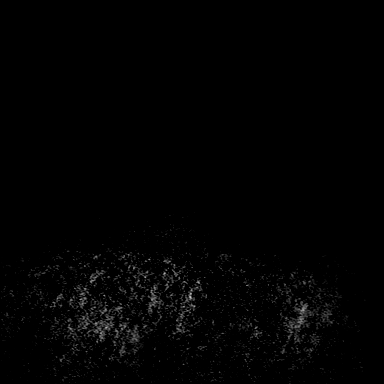

[Series 6: axial post 20 · axial · 1.3mm · 0.73mm/px · z∈[-77,+109]mm · 4 of 144 slices shown (3 of 3)]
[im 1/144]
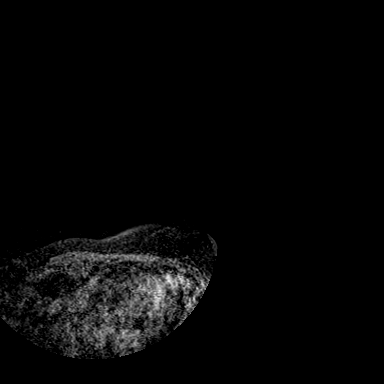
[im 48/144]
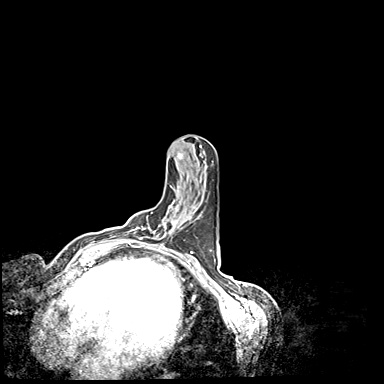
[im 96/144]
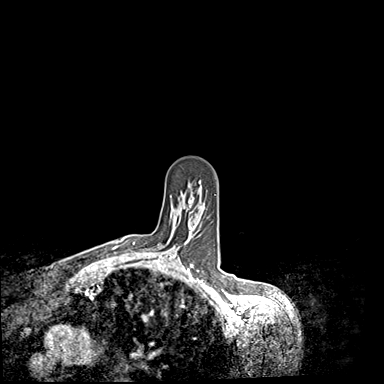
[im 144/144]
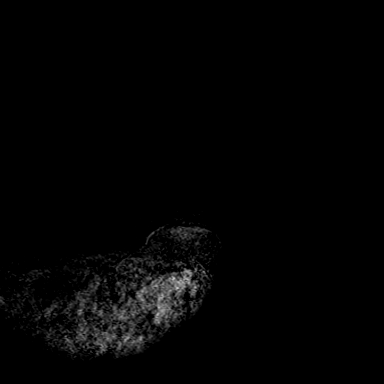

[Series 7: axial post 3 · axial · 1.3mm · 0.73mm/px · z∈[-77,+109]mm · 4 of 144 slices shown (1 of 3)]
[im 1/144]
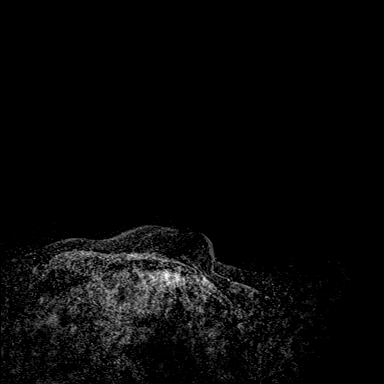
[im 48/144]
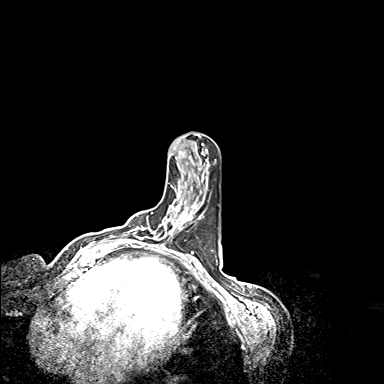
[im 96/144]
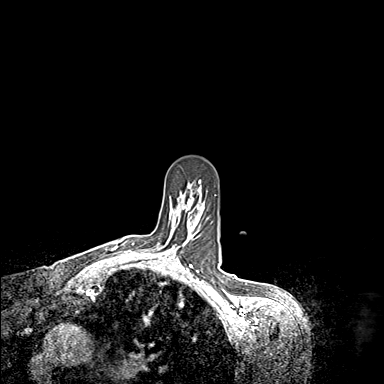
[im 144/144]
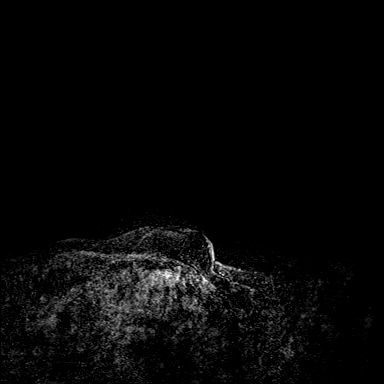

[Series 8: axial post 3 · axial · 1.3mm · 0.73mm/px · z∈[-77,+109]mm · 4 of 144 slices shown (2 of 3)]
[im 1/144]
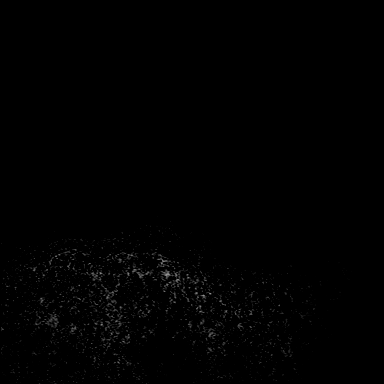
[im 48/144]
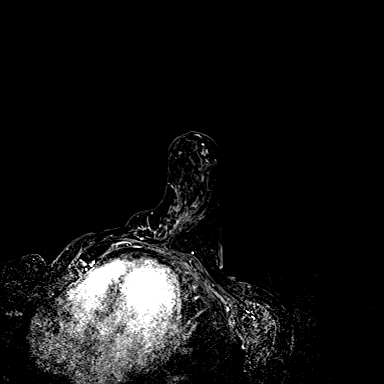
[im 96/144]
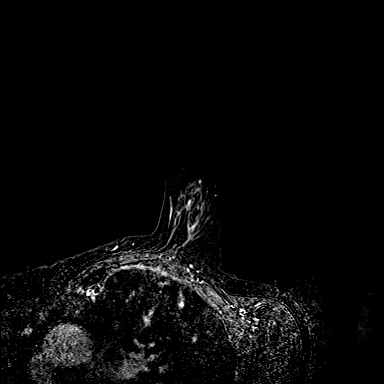
[im 144/144]
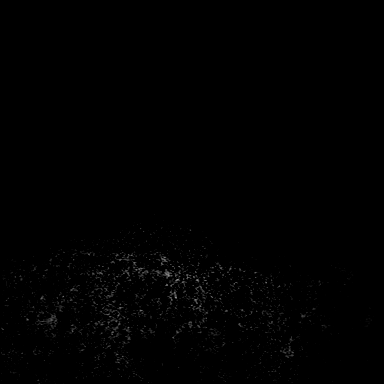

[Series 9: axial post 3 · axial · 1.3mm · 0.73mm/px · z∈[-77,+109]mm · 4 of 144 slices shown (3 of 3)]
[im 1/144]
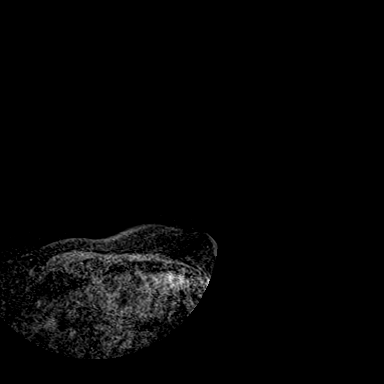
[im 48/144]
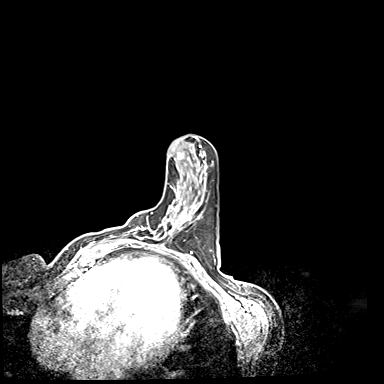
[im 96/144]
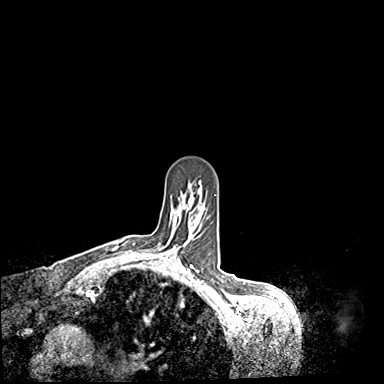
[im 144/144]
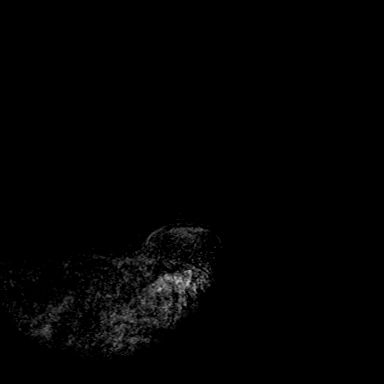

[Series 10: axial confirmation · axial · 1.3mm · 0.73mm/px · z∈[-77,+109]mm · 4 of 144 slices shown]
[im 1/144]
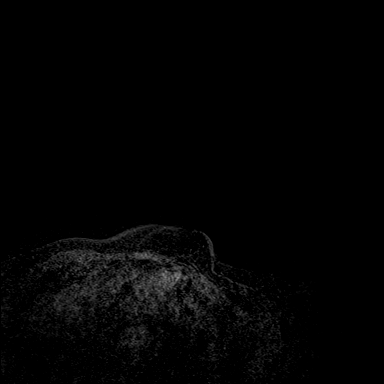
[im 48/144]
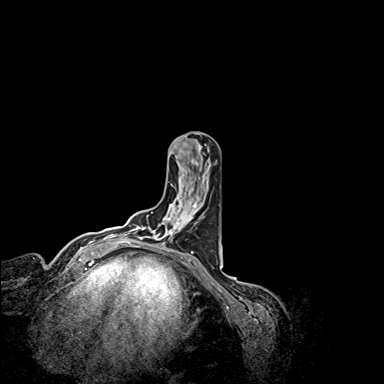
[im 96/144]
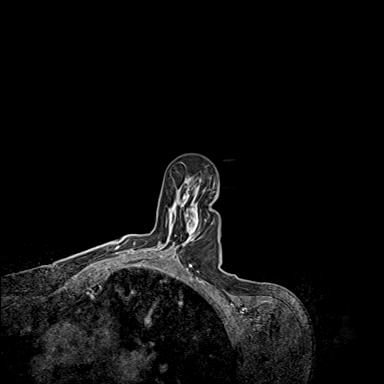
[im 144/144]
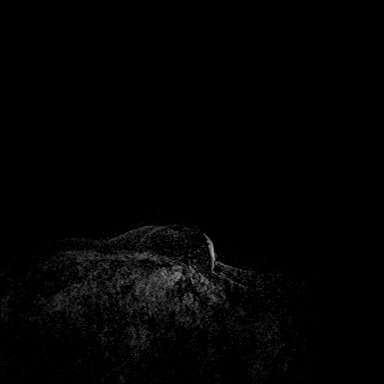

[Series 11: axial confirmation_sub · axial · 1.3mm · 0.73mm/px · 1 of 144 slices shown]
[im 1/144]
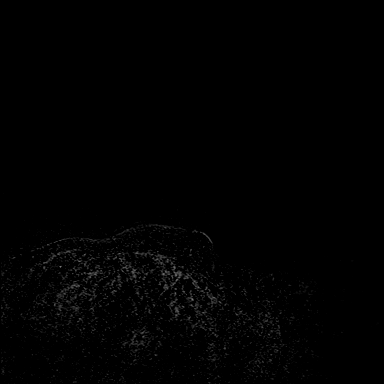

[33 of 48 positions shown; findings below may reference images not displayed]

FINDINGS: I met with the patient, and we discussed the procedure of MRI guided
biopsy, including risks, benefits, and alternatives. Specifically,
we discussed the risks of infection, bleeding, tissue injury, clip
migration, and inadequate sampling. Informed, written consent was
given. The usual time out protocol was performed immediately prior
to the procedure.

Using sterile technique, 1% Lidocaine, MRI guidance, and a 9 gauge
vacuum assisted device, biopsy was performed of the enhancing mass
in the upper slightly outer posterior left breast using a lateral to
medial approach. At the conclusion of the procedure, a dumbbell
shaped tissue marker clip was deployed into the biopsy cavity.
Follow-up 2-view mammogram was performed and dictated separately.
IMPRESSION: MRI guided biopsy of the enhancing mass in the upper slightly outer
posterior left breast. No apparent complications.

## 2019-04-06 ENCOUNTER — Other Ambulatory Visit: Payer: Self-pay | Admitting: General Surgery

## 2019-04-06 DIAGNOSIS — C50511 Malignant neoplasm of lower-outer quadrant of right female breast: Secondary | ICD-10-CM

## 2019-04-15 ENCOUNTER — Ambulatory Visit: Payer: BC Managed Care – PPO | Admitting: Orthopaedic Surgery

## 2019-04-16 ENCOUNTER — Ambulatory Visit
Admission: RE | Admit: 2019-04-16 | Discharge: 2019-04-16 | Disposition: A | Discharge status: Home / Self Care | Payer: BC Managed Care – PPO | Source: Ambulatory Visit | Attending: General Surgery | Admitting: General Surgery

## 2019-04-16 ENCOUNTER — Other Ambulatory Visit: Payer: Self-pay

## 2019-04-16 DIAGNOSIS — C50511 Malignant neoplasm of lower-outer quadrant of right female breast: Secondary | ICD-10-CM

## 2019-09-08 ENCOUNTER — Telehealth: Payer: Self-pay | Admitting: Medical Oncology

## 2019-09-08 NOTE — Telephone Encounter (Signed)
[id number] [facility]): Phone-call Call to patient regarding upcoming appointment for 79-month on study visit. I spoke with patient regarding her upcoming appointment with MD. Patient states she has two appointments scheduled with MD and she wasn't sure why. I clarified with her which one works best and cancelled the other. Patient to see MD on 5/21. I confirmed with patient that she has enough study drug through the 21st of May, per patient, she has approximately half a bottle left. Patient also confirms expiration date of November 30th, 2021 for the current study drug bottle. I asked patient to to bring in her study drug medication bottles and her medication diaries to her upcoming appointment on the 21st of May. Patient gave verbal understanding and denies any questions at this time. Patient thanked for her time and encouraged to call me with questions, in the meantime.  Maxwell Marion, RN, BSN, [facility] Clinical Research 09/08/2019 2:23 PM

## 2019-09-14 ENCOUNTER — Inpatient Hospital Stay: Payer: BC Managed Care – PPO | Admitting: Hematology and Oncology

## 2019-10-01 NOTE — Progress Notes (Signed)
Patient Care Team: Jettie Booze, NP as PCP - General (Family Medicine) Nicholas Lose, MD as Consulting Physician (Hematology and Oncology) Jovita Kussmaul, MD as Consulting Physician (General Surgery) Gery Pray, MD as Consulting Physician (Radiation Oncology) Gardenia Phlegm, NP as Nurse Practitioner (Hematology and Oncology)  DIAGNOSIS:    ICD-10-CM   1. 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, estrogen receptor positive ([place])  C50.511    Z17.0     SUMMARY OF ONCOLOGIC HISTORY: Oncology History  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, estrogen receptor positive ([place])  01/02/2017 Initial Diagnosis   Palpable right breast mass retroareolar 6:30 position: 3.6 cm size axilla negative, 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 HER-2 negative ratio 1.31 Ki-67 3% in addition calcifications UIQ 1.4 cm stereotactic biopsy flat epithelial atypia; clips are 4.3 cm apart, T2 N0 stage IB clinical stage AJCC 8   01/28/2017 Surgery   Bilateral mastectomies: Right: Grade 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%, PR 95%, HER-2 negative ratio 1.31, Ki-67 3%, 4/4 lymph nodes positive; left mastectomy: PASH and FC changes, no malignancy; T2 N2,  stage IIA AJCC 8   02/14/2017 Surgery   Right axillary lymph node dissection 8/11 lymph nodes positive   03/06/2017 - 07/25/2017 Chemotherapy   Dose dense AC x4 followed by Taxol x12   08/20/2017 - 10/02/2017 Radiation Therapy   Adj XRT   10/2017 -  Anti-estrogen oral therapy   Letrozole daily     CHIEF COMPLIANT: Follow-up of right breast cancer on letrozole therapy  INTERVAL HISTORY: Angelica Floyd is a 54 y.o. with above-mentioned history of right breast cancer treated with bilateral mastectomies, right axillary lymph node dissection, adjuvant chemotherapy, radiation, and is currently on antiestrogen therapy with letrozole. She is a participant in the ABC clinical trial. She presents to the  clinic today for follow-up.   Musculoskeletal aches and pains from letrozole therapy.  ALLERGIES:  has No Known Allergies.  MEDICATIONS:  Current Outpatient Medications  Medication Sig Dispense Refill  . acetaminophen (TYLENOL) 325 MG tablet Take 650 mg by mouth every 6 (six) hours as needed.    . Black Pepper-Turmeric (TURMERIC COMPLEX/BLACK PEPPER PO) Take 1 capsule by mouth daily.    . calcium-vitamin D (OSCAL WITH D) 500-200 MG-UNIT tablet Take 1 tablet by mouth.     . cholecalciferol (VITAMIN D3) 25 MCG (1000 UT) tablet Take 1 tablet (1,000 Units total) by mouth daily.    . Investigational aspirin/placebo 300 MG tablet Alliance [id number] Take 1 tablet by mouth daily. Take with food or a full glass of water.  Do not crush enteric coated tablets. 200 tablet 0  . ketotifen (ZADITOR) 0.025 % ophthalmic solution Place 2 drops into both eyes as needed (started using as needed for seasonal allergies in 2014).    Marland Kitchen letrozole (FEMARA) 2.5 MG tablet Take 1 tablet (2.5 mg total) by mouth daily. 90 tablet 3  . Multiple Vitamin (MULTIVITAMIN) tablet Take 1 tablet by mouth daily.    . vitamin C (ASCORBIC ACID) 500 MG tablet Take 1 tablet (500 mg total) by mouth daily.    Marland Kitchen zinc gluconate 50 MG tablet Take 1 tablet (50 mg total) by mouth daily.     No current facility-administered medications for this visit.    PHYSICAL EXAMINATION: ECOG PERFORMANCE STATUS: 1 - Symptomatic but completely ambulatory  Vitals:   10/02/19 0900  BP: 132/89  Pulse: 96  Resp: 18  Temp: 98.7 F (37.1 C)  SpO2: 100%   Filed Weights   10/02/19 0900  Weight: 133 lb 8 oz (60.6 kg)    BREAST no palpable lumps or nodules in bilateral mastectomy scars and axilla. (exam performed in the presence of a chaperone)  LABORATORY DATA:  I have reviewed the data as listed CMP Latest Ref Rng & Units 07/25/2017 07/18/2017 07/11/2017  Glucose 70 - 140 mg/dL 103 101 106  BUN 7 - 26 mg/dL '12 9 9  '$ Creatinine 0.60 - 1.10 mg/dL 0.83  0.87 0.84  Sodium 136 - 145 mmol/L 140 139 139  Potassium 3.5 - 5.1 mmol/L 3.8 3.6 3.8  Chloride 98 - 109 mmol/L 105 104 104  CO2 22 - 29 mmol/L '27 26 27  '$ Calcium 8.4 - 10.4 mg/dL 9.7 9.6 9.7  Total Protein 6.4 - 8.3 g/dL 6.7 6.7 6.7  Total Bilirubin 0.2 - 1.2 mg/dL 0.3 0.4 0.3  Alkaline Phos 40 - 150 U/L 54 57 63  AST 5 - 34 U/L '22 23 24  '$ ALT 0 - 55 U/L '23 27 26    '$ Lab Results  Component Value Date   WBC 3.1 (L) 07/25/2017   HGB 12.0 07/25/2017   HCT 35.0 07/25/2017   MCV 92.6 07/25/2017   PLT 233 07/25/2017   NEUTROABS 1.8 07/25/2017    ASSESSMENT & PLAN:  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, estrogen receptor positive ([place]) 01/28/2017: Bilateral mastectomies: Right: 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 adjuvant chemotherapy with dose dense Adriamycin and Cytoxan x4 followed by Taxol weekly x12completed 07/25/17 3. Adjuvant radiation4/9/19- 10/02/17 4. Adjuvant antiestrogen therapy with tamoxifen (which was originally started prior to surgery)switched to letrozole 10/26/2017 ABC clinical trial: Aspirin versus placebo started January 2020 ---------------------------------------------------------------------- Letrozoletoxicities: Muscle stiffness and achiness: She is on turmeric but it has not been helping her.  Breast cancer surveillance: 1.  Breast exam 10/02/2019: Benign 2.  No role of imaging studies because she had bilateral mastectomies  ABC clinical trial related toxicities: None Return to clinic in and get labs 6 months with  follow-up    No orders of the defined types were placed in this encounter.  The patient has a good understanding of the overall plan. she agrees with it. she will call with any problems that may develop before the next visit here.  Total time spent: 20 mins including face to face time and time spent for planning, charting and coordination of care  Nicholas Lose, MD 10/02/2019  I, Cloyde Reams Dorshimer, am acting as scribe for Dr. Nicholas Lose.  I have reviewed the above documentation for accuracy and completeness, and I agree with the above.

## 2019-10-02 ENCOUNTER — Inpatient Hospital Stay: Payer: BC Managed Care – PPO | Attending: Hematology and Oncology | Admitting: Hematology and Oncology

## 2019-10-02 ENCOUNTER — Other Ambulatory Visit: Payer: Self-pay

## 2019-10-02 ENCOUNTER — Encounter: Payer: Self-pay | Admitting: Medical Oncology

## 2019-10-02 DIAGNOSIS — Z9221 Personal history of antineoplastic chemotherapy: Secondary | ICD-10-CM

## 2019-10-02 DIAGNOSIS — Z170 Estrogen receptor positive status [ER+]: Secondary | ICD-10-CM

## 2019-10-02 DIAGNOSIS — C50511 Malignant neoplasm of lower-outer quadrant of right female breast: Secondary | ICD-10-CM

## 2019-10-02 DIAGNOSIS — Z006 Encounter for examination for normal comparison and control in clinical research program: Secondary | ICD-10-CM | POA: Diagnosis not present

## 2019-10-02 DIAGNOSIS — Z923 Personal history of irradiation: Secondary | ICD-10-CM | POA: Diagnosis not present

## 2019-10-02 DIAGNOSIS — Z79811 Long term (current) use of aromatase inhibitors: Secondary | ICD-10-CM | POA: Diagnosis not present

## 2019-10-02 DIAGNOSIS — Z9013 Acquired absence of bilateral breasts and nipples: Secondary | ICD-10-CM | POA: Diagnosis not present

## 2019-10-02 MED ORDER — INV-ASPIRIN/PLACEBO 300 MG TABS ALLIANCE A011502: 1.0000 | ORAL_TABLET | Freq: Every day | ORAL | 0 refills | Status: DC

## 2019-10-02 MED ORDER — LETROZOLE 2.5 MG PO TABS: 2.5000 mg | ORAL_TABLET | Freq: Every day | ORAL | 3 refills | Status: DC

## 2019-10-02 NOTE — Assessment & Plan Note (Signed)
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 adjuvant chemotherapy with dose dense Adriamycin and Cytoxan x4 followed by Taxol weekly x12completed 07/25/17 3. Adjuvant radiation4/9/19- 10/02/17 4. Adjuvant antiestrogen therapy with tamoxifen (which was originally started prior to surgery)switched to letrozole 10/26/2017 ABC clinical trial: Aspirin versus placebo started January 2020 ---------------------------------------------------------------------- Letrozoletoxicities: Muscle stiffness and achiness: She is on turmeric but it has not been helping her.  Breast cancer surveillance: 1.  Breast exam 10/02/2019: Benign 2.  No role of imaging studies because she had bilateral mastectomies  ABC clinical trial related toxicities: None Return to clinic in and get labs 6 months with follow-up

## 2019-10-02 NOTE — Research (Signed)
ABC Trial: Month 18  Patient in clinic today for her 18 month on-study visit and H&P with Dr. Lindi Adie. Patient's vital signs collected and documented. I met with patient after her visit with Dr. Lindi Adie, who addressed patient's questions/concerns. Per MD, patient to continue with current dose.   Concomitant Medications - Patient's medication list was reviewed with the patient and these were updated in her chart. Patient reports starting Tums, taking as needed, for heartburn, on 09/30/19 and Tart Cherry capsules for joint pain, for approxiamtely one month now. Both of these new concomitant medications are OTC. Patient confirms non-protocol use of an NSAIDs (ibuprofen) of less than one time per week, per patient, she may have taken a total of three tablets in the last 6 months.   Investigational Medication - Today, patient brought with her the previously dispensed study medication bottle. This bottle (Rx [id number]) was taken to pharmacy for count and count verified by PharmD Raul Del that there was 122 tablets remaining. I returned this bottle back to patient to continue with use and dispensed a new #200 pill study medication bottle (Rx [id number] exp 04/12/20, 300 mg), so that she will not run out of study drug prior to her next six month visit, which should occur sometime at the end of November of 2021. Patient knows to start this newly dispensed bottle after she has completed the #122 pills she has remaining from previous dispense.   Aspirin/Placebo Medication Log: Patient provided me today with her medication log starting from 03/15/2019- 09/11/2019. Patient reports and confirms no missed doses. Per patient no missed doses in May either, she states she keeps track of her doses on her phone calendar. Patient did not have May's diary, one was provided for her to complete today and she will mail this completed to me with the postage paid envelope provided to her. During today's visit, patient was provided  with medication logs to start with May 21st, 2021 through December, 2021. With patient's return today of 122 pills (from previous dispense bottle), per count, and per patient's medication log and her report, from the last visit of 03/15/2019- 10/01/2019= 201 days, with patient reporting a total of  aspirin/placebo taken and 0 missed days, per patient's med log and pill count, patient has demonstrated at least 96% compliance.   Adverse Events and Solicited AEs: Patient denies any changes in her health. She confirms she is tolerating the medication well. She denies experiencing any of the solicited AEs: ICH, GI bleed, epistaxis, hematuria, bruising. Patient confirms a grade 1 dyspepsia and is currently taking OTC tums as needed, starting approximately two days ago. Per patient, she feels the dyspepsia is related to her change in diet from just returning from [place] in [place]. No indication to reduce dose and patient will continue at her current dose of 300 mg. Per MD, there are no trial related toxicities at this visit.   H&P with ECOG and Disease Assessment: Patient seen by Dr. Lindi Adie today. Please see his documentation from that visit.  Plan/Visit Scheduling - Patient was thanked today for her time and ongoing support of study. Patient was informed that her next visit will be scheduled for approximately 6 months time, to be seen sometime between October and November 2021. She was informed at her next visit she will have blood and urine collected as well as quality of life questionnaires. All patient's questions answered to her satisfaction and patient was encouraged to call Dr. Lindi Adie or myself with any questions/concerns she may  have. My contact information was provided to patient. Maxwell Marion, RN, BSN, [facility] Clinical Research 10/02/2019 10:50 AM

## 2019-10-07 ENCOUNTER — Telehealth: Payer: Self-pay | Admitting: Hematology and Oncology

## 2019-10-07 NOTE — Telephone Encounter (Signed)
Scheduled per los, called patient and left a voicemail. 

## 2019-10-15 ENCOUNTER — Telehealth: Payer: Self-pay | Admitting: Medical Oncology

## 2019-10-15 NOTE — Telephone Encounter (Signed)
[id number] [facility]) Patient's May 2021 study medication diary received in the mail today. Per patient's documentation, she has missed no dosages and reports an end date of Oct 02, 2019 on this calendar for this past cycle.   Maxwell Marion, RN, BSN, [facility] Clinical Research 10/15/2019 2:00 PM

## 2020-03-24 ENCOUNTER — Telehealth: Payer: Self-pay | Admitting: Medical Oncology

## 2020-03-24 NOTE — Telephone Encounter (Signed)
ABC Study: outgoing call Call to patient to see if we can reschedule her for a different MD and lab visit day for study. I informed patient that research labs will need to be collected for this study visit and that, per the study, we cannot ship the labs out on a Friday. Patient confirms she is available on the 18th for completion of this study visit. States she prefers mornings, but is flexible with the time. I thanked patient and informed her that I will have schedulers contact her with new time for the 11/18th. Patient thanked for her flexibility and encouraged to call with questions.  Maxwell Marion, RN, BSN, [facility] Clinical Research 03/24/2020 2:42 PM

## 2020-03-28 ENCOUNTER — Other Ambulatory Visit: Payer: Self-pay | Admitting: Medical Oncology

## 2020-03-28 DIAGNOSIS — C50511 Malignant neoplasm of lower-outer quadrant of right female breast: Secondary | ICD-10-CM

## 2020-03-28 DIAGNOSIS — Z170 Estrogen receptor positive status [ER+]: Secondary | ICD-10-CM

## 2020-03-30 NOTE — Progress Notes (Signed)
Patient Care Team: Jettie Booze, NP as PCP - General (Family Medicine) Nicholas Lose, MD as Consulting Physician (Hematology and Oncology) Jovita Kussmaul, MD as Consulting Physician (General Surgery) Gery Pray, MD as Consulting Physician (Radiation Oncology) Gardenia Phlegm, NP as Nurse Practitioner (Hematology and Oncology)  DIAGNOSIS:    ICD-10-CM   1. 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, estrogen receptor positive ([place])  C50.511    Z17.0     SUMMARY OF ONCOLOGIC HISTORY: Oncology History  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, estrogen receptor positive ([place])  01/02/2017 Initial Diagnosis   Palpable right breast mass retroareolar 6:30 position: 3.6 cm size axilla negative, 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 HER-2 negative ratio 1.31 Ki-67 3% in addition calcifications UIQ 1.4 cm stereotactic biopsy flat epithelial atypia; clips are 4.3 cm apart, T2 N0 stage IB clinical stage AJCC 8   01/28/2017 Surgery   Bilateral mastectomies: Right: Grade 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%, PR 95%, HER-2 negative ratio 1.31, Ki-67 3%, 4/4 lymph nodes positive; left mastectomy: PASH and FC changes, no malignancy; T2 N2,  stage IIA AJCC 8   02/14/2017 Surgery   Right axillary lymph node dissection 8/11 lymph nodes positive   03/06/2017 - 07/25/2017 Chemotherapy   Dose dense AC x4 followed by Taxol x12   08/20/2017 - 10/02/2017 Radiation Therapy   Adj XRT   10/2017 -  Anti-estrogen oral therapy   Letrozole daily     CHIEF COMPLIANT: Follow-upof right breast canceron letrozole therapy  INTERVAL HISTORY: Angelica Floyd is a 54 y.o. with above-mentioned history of right breast cancer treated with bilateral mastectomies, right axillary lymph node dissection, adjuvant chemotherapy, radiation, and is currently on antiestrogen therapy with letrozole. She is a participant in the ABC clinical trial.She presents to the  clinic todayfor follow-up   ALLERGIES:  has No Known Allergies.  MEDICATIONS:  Current Outpatient Medications  Medication Sig Dispense Refill  . acetaminophen (TYLENOL) 325 MG tablet Take 650 mg by mouth every 6 (six) hours as needed.    . calcium-vitamin D (OSCAL WITH D) 500-200 MG-UNIT tablet Take 1 tablet by mouth.     . cholecalciferol (VITAMIN D3) 25 MCG (1000 UT) tablet Take 1 tablet (1,000 Units total) by mouth daily.    . Investigational aspirin/placebo 300 MG tablet Alliance [id number] Take 1 tablet by mouth daily. Take with food or a full glass of water.  Do not crush enteric coated tablets. 180 tablet 0  . ketotifen (ZADITOR) 0.025 % ophthalmic solution Place 2 drops into both eyes as needed (started using as needed for seasonal allergies in 2014).    Marland Kitchen letrozole (FEMARA) 2.5 MG tablet Take 1 tablet (2.5 mg total) by mouth daily. 90 tablet 3  . Multiple Vitamin (MULTIVITAMIN) tablet Take 1 tablet by mouth daily.    . vitamin C (ASCORBIC ACID) 500 MG tablet Take 1 tablet (500 mg total) by mouth daily.    Marland Kitchen zinc gluconate 50 MG tablet Take 1 tablet (50 mg total) by mouth daily.     No current facility-administered medications for this visit.    PHYSICAL EXAMINATION: ECOG PERFORMANCE STATUS: 1 - Symptomatic but completely ambulatory  There were no vitals filed for this visit. There were no vitals filed for this visit.  BREAST: No palpable masses or nodules in either right or left breasts. No palpable axillary supraclavicular or infraclavicular adenopathy no breast tenderness or nipple discharge. (  exam performed in the presence of a chaperone)  LABORATORY DATA:  I have reviewed the data as listed CMP Latest Ref Rng & Units 07/25/2017 07/18/2017 07/11/2017  Glucose 70 - 140 mg/dL 103 101 106  BUN 7 - 26 mg/dL $Remove'12 9 9  'IUKXmnh$ Creatinine 0.60 - 1.10 mg/dL 0.83 0.87 0.84  Sodium 136 - 145 mmol/L 140 139 139  Potassium 3.5 - 5.1 mmol/L 3.8 3.6 3.8  Chloride 98 - 109 mmol/L 105 104 104  CO2  22 - 29 mmol/L $RemoveB'27 26 27  'PwkiutxZ$ Calcium 8.4 - 10.4 mg/dL 9.7 9.6 9.7  Total Protein 6.4 - 8.3 g/dL 6.7 6.7 6.7  Total Bilirubin 0.2 - 1.2 mg/dL 0.3 0.4 0.3  Alkaline Phos 40 - 150 U/L 54 57 63  AST 5 - 34 U/L $Remo'22 23 24  'rlPDh$ ALT 0 - 55 U/L $Remo'23 27 26    'unuVl$ Lab Results  Component Value Date   WBC 3.1 (L) 07/25/2017   HGB 12.0 07/25/2017   HCT 35.0 07/25/2017   MCV 92.6 07/25/2017   PLT 233 07/25/2017   NEUTROABS 1.8 07/25/2017    ASSESSMENT & PLAN:  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, estrogen receptor positive ([place]) 01/28/2017: Bilateral mastectomies: Right: 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 adjuvant chemotherapy with dose dense Adriamycin and Cytoxan x4 followed by Taxol weekly x12completed 07/25/17 3. Adjuvant radiation4/9/19- 10/02/17 4. Adjuvant antiestrogen therapy with tamoxifen (which was originally started prior to surgery)switched to letrozole 10/26/2017 ABC clinical trial: Aspirin versus placebo started January 2020 ---------------------------------------------------------------------- Letrozoletoxicities: Muscle stiffness and achiness: She would like to try acupuncture.  I sent her contact information for that.  I also discussed with her about yoga and sun salutation's which can help alleviate the joint stiffness.  Occasional dizziness that lasts for split-second: I discussed with her about checking her blood pressure when she feels dizzy. Gaseousness: Discussed with her about using probiotics Genetic counseling: I recommended that she see a  Dietitian for testing.  Breast cancer surveillance: 1.  Breast exam 03/31/2020: Benign 2.  No role of imaging studies because she had bilateral mastectomies  ABC clinical trial related toxicities: None Return to clinic inand get labs24months with follow-up    No orders of the defined types were placed in this encounter.  The patient has a good understanding of the overall plan. she agrees with it. she will call with any problems that may develop before the next visit here.  Total time spent: 20 mins including face to face time and time spent for planning, charting and coordination of care  Nicholas Lose, MD 03/31/2020  I, Cloyde Reams Dorshimer, am acting as scribe for Dr. Nicholas Lose.  I have reviewed the above documentation for accuracy and completeness, and I agree with the above.

## 2020-03-31 ENCOUNTER — Other Ambulatory Visit: Payer: Self-pay

## 2020-03-31 ENCOUNTER — Inpatient Hospital Stay (HOSPITAL_BASED_OUTPATIENT_CLINIC_OR_DEPARTMENT_OTHER): Payer: BC Managed Care – PPO | Admitting: Hematology and Oncology

## 2020-03-31 ENCOUNTER — Telehealth: Payer: Self-pay | Admitting: Hematology and Oncology

## 2020-03-31 ENCOUNTER — Inpatient Hospital Stay: Payer: BC Managed Care – PPO | Attending: Hematology and Oncology

## 2020-03-31 ENCOUNTER — Encounter: Payer: Self-pay | Admitting: Medical Oncology

## 2020-03-31 VITALS — BP 131/83 | HR 79 | Temp 98.8°F | Resp 18 | Ht 63.0 in | Wt 129.5 lb

## 2020-03-31 DIAGNOSIS — Z006 Encounter for examination for normal comparison and control in clinical research program: Secondary | ICD-10-CM

## 2020-03-31 DIAGNOSIS — C50511 Malignant neoplasm of lower-outer quadrant of right female breast: Secondary | ICD-10-CM

## 2020-03-31 DIAGNOSIS — Z9013 Acquired absence of bilateral breasts and nipples: Secondary | ICD-10-CM | POA: Diagnosis not present

## 2020-03-31 DIAGNOSIS — R42 Dizziness and giddiness: Secondary | ICD-10-CM | POA: Diagnosis not present

## 2020-03-31 DIAGNOSIS — R140 Abdominal distension (gaseous): Secondary | ICD-10-CM | POA: Diagnosis not present

## 2020-03-31 DIAGNOSIS — Z79811 Long term (current) use of aromatase inhibitors: Secondary | ICD-10-CM | POA: Insufficient documentation

## 2020-03-31 DIAGNOSIS — Z923 Personal history of irradiation: Secondary | ICD-10-CM | POA: Diagnosis not present

## 2020-03-31 DIAGNOSIS — Z780 Asymptomatic menopausal state: Secondary | ICD-10-CM | POA: Diagnosis not present

## 2020-03-31 DIAGNOSIS — Z170 Estrogen receptor positive status [ER+]: Secondary | ICD-10-CM

## 2020-03-31 DIAGNOSIS — Z9221 Personal history of antineoplastic chemotherapy: Secondary | ICD-10-CM | POA: Insufficient documentation

## 2020-03-31 DIAGNOSIS — M791 Myalgia, unspecified site: Secondary | ICD-10-CM | POA: Insufficient documentation

## 2020-03-31 LAB — RESEARCH LABS

## 2020-03-31 MED ORDER — INV-ASPIRIN/PLACEBO 300 MG TABS ALLIANCE A011502: 1.0000 | ORAL_TABLET | Freq: Every day | ORAL | 0 refills | Status: DC

## 2020-03-31 NOTE — Telephone Encounter (Signed)
Scheduled appts per 11/18 los. Pt declined print out of AVS and stated she would refer to mychart.

## 2020-03-31 NOTE — Assessment & Plan Note (Signed)
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 adjuvant chemotherapy with dose dense Adriamycin and Cytoxan x4 followed by Taxol weekly x12completed 07/25/17 3. Adjuvant radiation4/9/19- 10/02/17 4. Adjuvant antiestrogen therapy with tamoxifen (which was originally started prior to surgery)switched to letrozole 10/26/2017 ABC clinical trial: Aspirin versus placebo started January 2020 ---------------------------------------------------------------------- Letrozoletoxicities: Muscle stiffness and achiness: She is on turmeric but it has not been helping her.  Breast cancer surveillance: 1.  Breast exam 03/31/2020: Benign 2.  No role of imaging studies because she had bilateral mastectomies  ABC clinical trial related toxicities: None Return to clinic inand get labs70month with follow-up

## 2020-03-31 NOTE — Research (Signed)
A RANDOMIZED PHASE III DOUBLE BLINDED PLACEBO CONTROLLED TRIAL OF ASPIRIN AS ADJUVANT THERAPY FOR HER2 NEGATIVE BREAST CANCER: THE ABC TRIAL   [id number]: 24 month, in-clinic visit.   Patient in clinic, alone, today for her 24 month on-study visit and H&P with Dr. Lindi Adie. Patient's vital signs collected and documented. I met with patient just before her appointment with Dr. Lindi Adie, who addressed patient's questions/concerns.   Labs: Patient had consent to esearch blood and urine to be collected, and these were collected prior to MD appointment.  Questionnaires:  89-month time point questionnaire completed.   Concomitant Medications - Patient medications reviewed and patient denies taking any non-protocol use of aspirin or other NSAID's during this reporting period.   Investigational Medication - Today, patient returned her the previously dispensed study medication bottles. Patient returned three bottles today. Study medication bottle dispensed on 12/18/2018, returned empty. Study medication bottle dispensed on 03/16/2019, also returned empty. Study medication bottle dispensed 10/02/2019 was returned today with 163 tablets remained, second count confirmed by PharmD Raul Del. This medication bottle, containing 163 pills, was not re-dispensed to the patient, due to the expiration of these tablets being 04/12/2020. Patient was dispensed a new study medication bottle Rx [id number], #200 pills, 300 mg, exp 02/10/22. Patient confirms understanding to start this new bottle today.   Aspirin/Placebo Medication Log: Patient provided me today with her medication log starting from 10/02/2019 - 03/30/2020. Patient documents one missed dose on February 05, 2020. Patient had a total of 322 medication pills at last clinic visit count. With patient's return today of 163 pills (from previous dispensed bottles), per count, and per patient's medication log and her report, from the last visit of 10/02/2019- 03/30/2020=   181 days, patient reporting a total of 180 aspirin/placebo taken with 1 missed day, per patient's med log and pill count, patient has demonstrated at least 87.8 % compliance, second check by Foye Spurling RN.  During today's visit, patient was provided with medication logs to start with November 18th, 2021 through May, 2022.   Adverse Events and Solicited AEs: Patient confirms she is doing well and confirms she is tolerating the medication well. She denies experiencing any of the solicited AEs: ICH, GI bleed, epistaxis, hematuria, bruising, dyspepsia, or gastritis.   H&P with ECOG and Disease Assessment: Patient seen by Dr. Lindi Adie today. Please see his documentation from that visit. Per MD, patient to continue at current dose of 300 mg Aspirin/Placebo.  Plan/Visit Scheduling - Patient was thanked today for her time and ongoing support of study. Patient was informed that her next visit will be scheduled for approximately 6 months time, to be seen sometime in May 2022 for clinic visit, collection of medication diaries, pill bottle and new dispense of study medication. All patient's questions answered to her satisfaction and patient was encouraged to call Dr. Lindi Adie or myself with any questions/concerns she may have.  Maxwell Marion, RN, BSN, [facility] Clinical Research 03/31/2020 12:06 PM

## 2020-04-01 ENCOUNTER — Inpatient Hospital Stay: Payer: BC Managed Care – PPO | Admitting: Hematology and Oncology

## 2020-04-01 ENCOUNTER — Inpatient Hospital Stay: Payer: BC Managed Care – PPO

## 2020-04-06 ENCOUNTER — Other Ambulatory Visit: Payer: BC Managed Care – PPO

## 2020-04-06 ENCOUNTER — Ambulatory Visit: Payer: BC Managed Care – PPO | Admitting: Hematology and Oncology

## 2020-04-13 ENCOUNTER — Other Ambulatory Visit: Payer: Self-pay | Admitting: Medical Oncology

## 2020-04-13 ENCOUNTER — Telehealth: Payer: Self-pay | Admitting: Medical Oncology

## 2020-04-13 NOTE — Telephone Encounter (Signed)
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   Patient returned call @ 1425 Patient was informed that study experts, after a review of the study, have found that participants treated with aspirin did not do better than participants taking the placebo with regard to the rate of their cancer coming back. Patient was informed that due to these results, it has been recommended that the study be stopped and that she stops taking the study drug at this time. Patient was also informed that she will continue the study specified schedule for study visits and monitoring until further notice and she should continue any current cancer medications and continue follow-up with Dr. Lindi Adie. Patient gave her verbal understanding to this new information and stated she will stop today. Patient states she has not taken the study drug Aspirin/placebo today, most recent pill was yesterday, and confirms she will no longer take it going forward. I asked patient to save the study dispensed medication and her medication logs and bring them with her at her next scheduled appointment with Dr. Lindi Adie (09/26/2020). Patient informed me that she will be in the clinic next week for a genetics appointment and that she will bring the study drug and  medication logs at that time (12/8). Patient was also informed that unblinding of the study drug should occur within the next few weeks and at that time, we will inform her of whether she was on the study drug aspirin or the placebo. All patient's questions answered to her satisfaction. Patient was thanked for her time and contribution to the study and encouraged to call with any questions she may have.  Maxwell Marion, RN, BSN, [place] Clinical  Research

## 2020-04-20 ENCOUNTER — Inpatient Hospital Stay: Payer: BC Managed Care – PPO | Attending: Hematology and Oncology | Admitting: Genetic Counselor

## 2020-04-20 ENCOUNTER — Other Ambulatory Visit: Payer: Self-pay

## 2020-04-20 ENCOUNTER — Inpatient Hospital Stay: Payer: BC Managed Care – PPO

## 2020-04-20 ENCOUNTER — Encounter: Payer: Self-pay | Admitting: Genetic Counselor

## 2020-04-20 ENCOUNTER — Encounter: Payer: Self-pay | Admitting: Medical Oncology

## 2020-04-20 DIAGNOSIS — C50511 Malignant neoplasm of lower-outer quadrant of right female breast: Secondary | ICD-10-CM

## 2020-04-20 DIAGNOSIS — Z803 Family history of malignant neoplasm of breast: Secondary | ICD-10-CM

## 2020-04-20 DIAGNOSIS — Z170 Estrogen receptor positive status [ER+]: Secondary | ICD-10-CM

## 2020-04-20 NOTE — Progress Notes (Signed)
REFERRING PROVIDER: Nicholas Lose, MD [address] [place],  [place] [address]  PRIMARY PROVIDER:  Jettie Booze, NP  PRIMARY REASON FOR VISIT:  1. 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, estrogen receptor positive ([place])   2. Family history of breast cancer      HISTORY OF PRESENT ILLNESS:   Angelica Floyd, a 54 y.o. female, was seen for a [place] cancer genetics consultation at the request of Dr. Lindi Adie due to a personal and family history of breast cancer.  Angelica Floyd presents to clinic today to discuss the possibility of a hereditary predisposition to cancer, genetic testing, and to further clarify her future cancer risks, as well as potential cancer risks for family members.   In September 2018, at the age of 33, Angelica Floyd was diagnosed with lobular cancer of the right breast. The treatment plan included bilateral mastectomy, chemotherapy, radiation and letrozole.    CANCER HISTORY:  Oncology History  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, estrogen receptor positive ([place])  01/02/2017 Initial Diagnosis   Palpable right breast mass retroareolar 6:30 position: 3.6 cm size axilla negative, 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 HER-2 negative ratio 1.31 Ki-67 3% in addition calcifications UIQ 1.4 cm stereotactic biopsy flat epithelial atypia; clips are 4.3 cm apart, T2 N0 stage IB clinical stage AJCC 8   01/28/2017 Surgery   Bilateral mastectomies: Right: Grade 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%, PR 95%, HER-2 negative ratio 1.31, Ki-67 3%, 4/4 lymph nodes positive; left mastectomy: PASH and FC changes, no malignancy; T2 N2,  stage IIA AJCC 8   02/14/2017 Surgery   Right axillary lymph node dissection 8/11 lymph nodes positive   03/06/2017 - 07/25/2017 Chemotherapy   Dose dense AC x4 followed by Taxol x12   08/20/2017 - 10/02/2017 Radiation Therapy   Adj XRT   10/2017 -  Anti-estrogen oral therapy   Letrozole  daily      RISK FACTORS:  Menarche was at age 66.  First live birth at age 83.  OCP use for approximately 12 years.  Ovaries intact: yes.  Hysterectomy: yes.  Menopausal status: perimenopausal.  HRT use: 0 years. Colonoscopy: yes; normal. Mammogram within the last year: n/a. Number of breast biopsies: 1. Up to date with pelvic exams: yes. Any excessive radiation exposure in the past: previous radiation therapy with breast cancer  Past Medical History:  Diagnosis Date  . Anxiety    started at the time of her breast cancer diagnosis (August 2018)  . Family history of breast cancer   . High cholesterol    Per patient 02/19/18: Her LDL was 100, but HDL was elevated; no intervention at this time.  Marland Kitchen History of chemotherapy    finished chemo 07/29/2017  . Hot flashes started after letrozole dosing began   per patient 02/19/18  . Hypocalcemia    per patient 02/19/18: Her recent Calcium level was elevated per her PCP, so she takes only one calcium supplment daily (historically she took 2)  . Insomnia started after breast cancer surgery   Per 02/19/18: due to anxiety and hot flashes  . Joint pain started at time of letrozole start   per patient 02/19/18  . 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, estrogen receptor positive ([place]) 01/08/2017  . Seasonal allergies    per patient 02/19/18: She has been experiencing for at least 20 years    Past Surgical History:  Procedure Laterality Date  . ABDOMINAL HYSTERECTOMY  2015   partial; ovaires remain  . ADENOIDECTOMY W/ MYRINGOTOMY  at age 22  . APPENDECTOMY     2015  . AXILLARY LYMPH NODE DISSECTION Right 02/14/2017   Procedure: RIGHT AXILLARY LYMPH NODE DISSECTION ERAS PATHWAY;  Surgeon: Jovita Kussmaul, MD;  Location: [place];  Service: General;  Laterality: Right;  . CESAREAN SECTION  2005  . MASTECTOMY W/ SENTINEL NODE BIOPSY Bilateral 01/28/2017   RIGHT BREAST BIOPSY  . MASTECTOMY W/ SENTINEL NODE BIOPSY  Bilateral 01/28/2017   Procedure: BILATERAL MASTECTOMY WITH RIGHT SENTINEL LYMPH NODE BIOPSY;  Surgeon: Jovita Kussmaul, MD;  Location: [place];  Service: General;  Laterality: Bilateral;  . PORT-A-CATH REMOVAL Left 08/08/2017   Procedure: REMOVAL PORT-A-CATH;  Surgeon: Jovita Kussmaul, MD;  Location: [place];  Service: General;  Laterality: Left;  . PORTACATH PLACEMENT Left 02/14/2017   Procedure: INSERTION PORT-A-CATH;  Surgeon: Jovita Kussmaul, MD;  Location: [place];  Service: General;  Laterality: Left;  . RE-EXCISION OF BREAST CANCER,SUPERIOR MARGINS N/A 02/14/2017   Procedure: RE-EXCISION INFERIOR FLAP;  Surgeon: Jovita Kussmaul, MD;  Location: [place];  Service: General;  Laterality: N/A;  . WISDOM TOOTH EXTRACTION  at age 30    Social History   Socioeconomic History  . Marital status: Married    Spouse name: Not on file  . Number of children: Not on file  . Years of education: Not on file  . Highest education level: Not on file  Occupational History  . Not on file  Tobacco Use  . Smoking status: Never Smoker  . Smokeless tobacco: Never Used  Vaping Use  . Vaping Use: Never used  Substance and Sexual Activity  . Alcohol use: Not Currently  . Drug use: No  . Sexual activity: Not on file  Other Topics Concern  . Not on file  Social History Narrative  . Not on file   Social Determinants of Health   Financial Resource Strain:   . Difficulty of Paying Living Expenses: Not on file  Food Insecurity:   . Worried About Charity fundraiser in the Last Year: Not on file  . Ran Out of Food in the Last Year: Not on file  Transportation Needs:   . Lack of Transportation (Medical): Not on file  . Lack of Transportation (Non-Medical): Not on file  Physical Activity:   . Days of Exercise per Week: Not on file  . Minutes of Exercise per Session: Not on file  Stress:   . Feeling of Stress : Not on file  Social Connections:   . Frequency of Communication with Friends and  Family: Not on file  . Frequency of Social Gatherings with Friends and Family: Not on file  . Attends Religious Services: Not on file  . Active Member of Clubs or Organizations: Not on file  . Attends Archivist Meetings: Not on file  . Marital Status: Not on file     FAMILY HISTORY:  We obtained a detailed, 4-generation family history.  Significant diagnoses are listed below: Family History  Problem Relation Age of Onset  . Aneurysm Father 37       brain  . Heart failure Maternal Grandmother   . Heart failure Maternal Grandfather   . Stroke Paternal Grandmother   . Breast cancer Other        MGF's sister    The patient has two sons who are cancer  free.  She has a brother and sister who are cancer free.  Her father is deceased and her mother is living.  Her father died of a brain aneurysm. He had three sister who are cancer free.  There is not a lot known about this side of the family.  The patient's mother is living.  She has two sisters who are cancer free.  The maternal grandparents reportedly died of heart failure.  The grandfather had a sister who had breast cancer.  Angelica Floyd is unaware of previous family history of genetic testing for hereditary cancer risks. Patient's maternal ancestors are of [place] descent, and paternal ancestors are of [place] descent. There is no reported Ashkenazi Jewish ancestry. There is no known consanguinity.  GENETIC COUNSELING ASSESSMENT: Angelica Floyd is a 54 y.o. female with a personal and family history of breast cancer which is somewhat suggestive of a hereditary cancer syndrome and predisposition to cancer given the patient's young age of onset and the family history of cancer. We, therefore, discussed and recommended the following at today's visit.   DISCUSSION: We discussed that 5 - 10% of breast cancer is hereditary, with most cases associated with BRCA mutations.  There are other genes that can be associated with hereditary breast  cancer syndromes.  These include ATM, CHEK2 and PALB2.  We discussed that testing is beneficial for several reasons including knowing how to follow individuals after completing their treatment, and understand if other family members could be at risk for cancer and allow them to undergo genetic testing.   We reviewed the characteristics, features and inheritance patterns of hereditary cancer syndromes. We also discussed genetic testing, including the appropriate family members to test, the process of testing, insurance coverage and turn-around-time for results. We discussed the implications of a negative, positive, carrier and/or variant of uncertain significant result. We recommended Angelica Floyd pursue genetic testing for the CancerNext+RNAinsight gene panel. The CancerNext gene panel offered by [employer] includes sequencing and rearrangement analysis for the following 34 genes:   APC, ATM, BARD1, BMPR1A, BRCA1, BRCA2, BRIP1, CDH1, CDK4, CDKN2A, CHEK2, DICER1, HOXB13, EPCAM, GREM1, MLH1, MRE11A, MSH2, MSH6, MUTYH, NBN, NF1, PALB2, PMS2, POLD1, POLE, PTEN, RAD50, RAD51C, RAD51D, SMAD4, SMARCA4, STK11, and TP53.    Based on Angelica Floyd's personal and family history of cancer, she meets medical criteria for genetic testing. Despite that she meets criteria, she may still have an out of pocket cost. We discussed that if her out of pocket cost for testing is over $100, the laboratory will call and confirm whether she wants to proceed with testing.  If the out of pocket cost of testing is less than $100 she will be billed by the genetic testing laboratory.   PLAN: After considering the risks, benefits, and limitations, Angelica Floyd provided informed consent to pursue genetic testing and the blood sample was sent to [facility] for analysis of the CancerNext+RNAinsight gene panel. Results should be available within approximately 2-3 weeks' time, at which point they will be disclosed by telephone to  Angelica Floyd, as will any additional recommendations warranted by these results. Angelica Floyd will receive a summary of her genetic counseling visit and a copy of her results once available. This information will also be available in Epic.   Lastly, we encouraged Angelica Floyd to remain in contact with cancer genetics annually so that we can continuously update the family history and inform her of any changes in cancer genetics and testing that may be of benefit  for this family.   Angelica Floyd questions were answered to her satisfaction today. Our contact information was provided should additional questions or concerns arise. Thank you for the referral and allowing [place] to share in the care of your patient.   Estrella Floyd P. Florene Glen, [place], [facility] Licensed, Insurance risk surveyor [email or web address] phone: [phone]  The patient was seen for a total of 30 minutes in face-to-face genetic counseling.  This patient was discussed with Drs. Magrinat, Lindi Adie and/or Burr Medico who agrees with the above.    _______________________________________________________________________ For Office Staff:  Number of people involved in session: 1 Was an Intern/ student involved with case: no

## 2020-04-20 NOTE — Progress Notes (Signed)
[id number]: A RANDOMIZED PHASE III DOUBLE BLINDED PLACEBO CONTROLLED TRIAL OF ASPIRIN AS ADJUVANT THERAPY FOR HER2 NEGATIVE BREAST CANCER: THE ABC TRIAL  Patient in clinic this morning for a scheduled genetics appointment. I met with patient briefly and she provided me with the study dispensed study drug,  Aspirin/Placebo that was dispensed at her 24 month study visit in November, along with the medication diaries that were provided to her as well. Last date documented of Aspirin/Placebo 390m taken is on November 29th, 2021. Patient returned # 190 tablets, Rx [id number] Bottle returned to Pharmacy for documentation of return and count, completed by PharmD GKennith Center Patient inquired when unblinding will occur, I informed patient most likely early next year in January, but release date is still pending. Patient was informed as soon as we have the unblinding information, I will call her to inform her. Patient denies further questions at this time. Patient thanked for her time and contribution to study and was reminded that we will see her at her next scheduled appointment for the 30 month study visit with Dr. GLindi Adie(09/26/2020). Patient encouraged to call in the mean time with questions.  Patient was dispensed #200 pills (03/31/2020). Patient took first [place]11/19/2021, last pill documented as taken 04/11/2020, as reported on patient's medication diaries, for a total of 11 days of study drug taken.  MMaxwell Marion RN, BSN, [facility] 04/20/2020 10:46 AM

## 2020-05-16 ENCOUNTER — Encounter: Payer: Self-pay | Admitting: Genetic Counselor

## 2020-05-16 ENCOUNTER — Telehealth: Payer: Self-pay | Admitting: Genetic Counselor

## 2020-05-16 DIAGNOSIS — Z1379 Encounter for other screening for genetic and chromosomal anomalies: Secondary | ICD-10-CM | POA: Insufficient documentation

## 2020-05-16 NOTE — Telephone Encounter (Signed)
LM on VM that results are back and to please call. 

## 2020-05-17 ENCOUNTER — Ambulatory Visit: Payer: Self-pay | Admitting: Genetic Counselor

## 2020-05-17 DIAGNOSIS — Z1379 Encounter for other screening for genetic and chromosomal anomalies: Secondary | ICD-10-CM

## 2020-05-17 NOTE — Progress Notes (Signed)
HPI:  Ms. Arciniega was previously seen in the [place] clinic due to a personal and family history of breast cancer and concerns regarding a hereditary predisposition to cancer. Please refer to our prior cancer genetics clinic note for more information regarding our discussion, assessment and recommendations, at the time. Ms. Howser recent genetic test results were disclosed to her, as were recommendations warranted by these results. These results and recommendations are discussed in more detail below.  CANCER HISTORY:  Oncology History  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, estrogen receptor positive ([place])  01/02/2017 Initial Diagnosis   Palpable right breast mass retroareolar 6:30 position: 3.6 cm size axilla negative, 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 HER-2 negative ratio 1.31 Ki-67 3% in addition calcifications UIQ 1.4 cm stereotactic biopsy flat epithelial atypia; clips are 4.3 cm apart, T2 N0 stage IB clinical stage AJCC 8   01/28/2017 Surgery   Bilateral mastectomies: Right: Grade 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%, PR 95%, HER-2 negative ratio 1.31, Ki-67 3%, 4/4 lymph nodes positive; left mastectomy: PASH and FC changes, no malignancy; T2 N2,  stage IIA AJCC 8   02/14/2017 Surgery   Right axillary lymph node dissection 8/11 lymph nodes positive   03/06/2017 - 07/25/2017 Chemotherapy   Dose dense AC x4 followed by Taxol x12   08/20/2017 - 10/02/2017 Radiation Therapy   Adj XRT   10/2017 -  Anti-estrogen oral therapy   Letrozole daily   05/12/2020 Genetic Testing   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  The CancerNext gene panel offered by [employer] includes sequencing and rearrangement analysis for the following 34 genes:   APC, ATM, BARD1, BMPR1A, BRCA1, BRCA2, BRIP1, CDH1, CDK4, CDKN2A, CHEK2, DICER1, HOXB13, EPCAM, GREM1, MLH1, MRE11A, MSH2, MSH6, MUTYH, NBN, NF1, PALB2, PMS2, POLD1, POLE, PTEN,  RAD50, RAD51C, RAD51D, SMAD4, SMARCA4, STK11, and TP53.  The report date is May 12, 2020.     FAMILY HISTORY:  We obtained a detailed, 4-generation family history.  Significant diagnoses are listed below: Family History  Problem Relation Age of Onset  . Aneurysm Father 64       brain  . Heart failure Maternal Grandmother   . Heart failure Maternal Grandfather   . Stroke Paternal Grandmother   . Breast cancer Other        MGF's sister    The patient has two sons who are cancer free.  She has a brother and sister who are cancer free.  Her father is deceased and her mother is living.  Her father died of a brain aneurysm. He had three sister who are cancer free.  There is not a lot known about this side of the family.  The patient's mother is living.  She has two sisters who are cancer free.  The maternal grandparents reportedly died of heart failure.  The grandfather had a sister who had breast cancer.  Ms. Devaul is unaware of previous family history of genetic testing for hereditary cancer risks. Patient's maternal ancestors are of [place] descent, and paternal ancestors are of [place] descent. There is no reported Ashkenazi Jewish ancestry. There is no known consanguinity.  GENETIC TEST RESULTS: Genetic testing reported out on May 12, 2020 through the CancerNext+RNAinsight cancer panel found no pathogenic mutations. The CancerNext gene panel offered by [employer] includes sequencing and rearrangement analysis for the following 34 genes:   APC, ATM, BARD1, BMPR1A, BRCA1, BRCA2, BRIP1, CDH1, CDK4, CDKN2A,  CHEK2, DICER1, HOXB13, EPCAM, GREM1, MLH1, MRE11A, MSH2, MSH6, MUTYH, NBN, NF1, PALB2, PMS2, POLD1, POLE, PTEN, RAD50, RAD51C, RAD51D, SMAD4, SMARCA4, STK11, and TP53. The test report has been scanned into EPIC and is located under the Molecular Pathology section of the Results Review tab.  A portion of the result report is included below for reference.     We discussed  with Ms. Pecina that because current genetic testing is not perfect, it is possible there may be a gene mutation in one of these genes that current testing cannot detect, but that chance is small.  We also discussed, that there could be another gene that has not yet been discovered, or that we have not yet tested, that is responsible for the cancer diagnoses in the family. It is also possible there is a hereditary cause for the cancer in the family that Ms. Neace did not inherit and therefore was not identified in her testing.  Therefore, it is important to remain in touch with cancer genetics in the future so that we can continue to offer Ms. Dirden the most up to date genetic testing.   Genetic testing did identify a variant of uncertain significance (VUS) was identified in the BMPR1A gene called c.334-3T>C.  At this time, it is unknown if this variant is associated with increased cancer risk or if this is a normal finding, but most variants such as this get reclassified to being inconsequential. It should not be used to make medical management decisions. With time, we suspect the lab will determine the significance of this variant, if any. If we do learn more about it, we will try to contact Ms. Wileman to discuss it further. However, it is important to stay in touch with [place] periodically and keep the address and phone number up to date.  ADDITIONAL GENETIC TESTING: We discussed with Ms. Flicker that there are other genes that are associated with increased cancer risk that can be analyzed. Should Ms. Gascoigne wish to pursue additional genetic testing, we are happy to discuss and coordinate this testing, at any time.     CANCER SCREENING RECOMMENDATIONS: Ms. Koslosky test result is considered negative (normal).  This means that we have not identified a hereditary cause for her personal and family history of breast cancer at this time. Most cancers happen by chance and this negative test suggests that her cancer  may fall into this category.    While reassuring, this does not definitively rule out a hereditary predisposition to cancer. It is still possible that there could be genetic mutations that are undetectable by current technology. There could be genetic mutations in genes that have not been tested or identified to increase cancer risk.  Therefore, it is recommended she continue to follow the cancer management and screening guidelines provided by her oncology and primary healthcare provider.   An individual's cancer risk and medical management are not determined by genetic test results alone. Overall cancer risk assessment incorporates additional factors, including personal medical history, family history, and any available genetic information that may result in a personalized plan for cancer prevention and surveillance  RECOMMENDATIONS FOR FAMILY MEMBERS:  Individuals in this family might be at some increased risk of developing cancer, over the general population risk, simply due to the family history of cancer.  We recommended women in this family have a yearly mammogram beginning at age 29, or 60 years younger than the earliest onset of cancer, an annual clinical breast exam, and perform monthly breast  self-exams. Women in this family should also have a gynecological exam as recommended by their primary provider. All family members should be referred for colonoscopy starting at age 80.  FOLLOW-UP: Lastly, we discussed with Ms. Tomassetti that cancer genetics is a rapidly advancing field and it is possible that new genetic tests will be appropriate for her and/or her family members in the future. We encouraged her to remain in contact with cancer genetics on an annual basis so we can update her personal and family histories and let her know of advances in cancer genetics that may benefit this family.   Our contact number was provided. Ms. Schmuhl questions were answered to her satisfaction, and she knows she  is welcome to call us at anytime with additional questions or concerns.   Roma Kayser, [place], [facility] Licensed, Certified Genetic Counselor [email or web address]

## 2020-05-17 NOTE — Telephone Encounter (Signed)
Revealed negative genetic testing.  Discussed that we do not know why she has breast cancer or why there is cancer in the family. It could be due to a different gene that we are not testing, or maybe our current technology may not be able to pick something up.  It will be important for her to keep in contact with genetics to keep up with whether additional testing may be needed. 

## 2020-06-10 ENCOUNTER — Telehealth: Payer: Self-pay | Admitting: Medical Oncology

## 2020-06-10 NOTE — Telephone Encounter (Signed)
[id number]: A RANDOMIZED PHASE III DOUBLE BLINDED PLACEBO CONTROLLED TRIAL OF ASPIRIN AS ADJUVANT THERAPY FOR HER2 NEGATIVE BREAST CANCER: THE ABC TRIAL  Outgoing call: unblinding information  Spoke with patient and informed her that the study has been unblinded and informed us that she was receiving a Placebo and not Aspirin on the study. Patient gave her verbal understanding and denied having any questions at this time. Patient was reminded of her May appointment with Dr. Lindi Adie and was encouraged to call with questions. Patient thanked for her time and her continued support to the study.  Maxwell Marion, RN, BSN, [facility] Clinical Research 06/10/2020 2:17 PM

## 2020-07-07 ENCOUNTER — Other Ambulatory Visit: Payer: Self-pay

## 2020-07-07 ENCOUNTER — Ambulatory Visit
Admission: RE | Admit: 2020-07-07 | Discharge: 2020-07-07 | Disposition: A | Discharge status: Home / Self Care | Payer: BC Managed Care – PPO | Source: Ambulatory Visit | Attending: Hematology and Oncology | Admitting: Hematology and Oncology

## 2020-07-07 ENCOUNTER — Other Ambulatory Visit: Payer: BC Managed Care – PPO

## 2020-07-07 DIAGNOSIS — Z780 Asymptomatic menopausal state: Secondary | ICD-10-CM

## 2020-07-08 ENCOUNTER — Telehealth: Payer: Self-pay | Admitting: *Deleted

## 2020-07-08 NOTE — Telephone Encounter (Signed)
Per [name], NP, called to make pt aware of bone density scan. Advised to take calcium and vitamin D and continue weight bearing exercises. Pt verbalized understanding

## 2020-08-22 ENCOUNTER — Telehealth: Payer: Self-pay | Admitting: Hematology and Oncology

## 2020-08-22 NOTE — Telephone Encounter (Signed)
Rescheduled upcoming appointment per 4/11 schedule message. Patient is aware of changes. 

## 2020-09-09 ENCOUNTER — Telehealth: Payer: Self-pay | Admitting: Hematology and Oncology

## 2020-09-09 NOTE — Telephone Encounter (Signed)
Rescheduled per 4/29 email. Called and spoke with pt confirmed appt change time

## 2020-09-26 ENCOUNTER — Ambulatory Visit: Payer: BC Managed Care – PPO | Admitting: Hematology and Oncology

## 2020-09-26 NOTE — Progress Notes (Signed)
Patient Care Team: Jettie Booze, NP as PCP - General (Family Medicine) Nicholas Lose, MD as Consulting Physician (Hematology and Oncology) Jovita Kussmaul, MD as Consulting Physician (General Surgery) Gery Pray, MD as Consulting Physician (Radiation Oncology) Gardenia Phlegm, NP as Nurse Practitioner (Hematology and Oncology)  DIAGNOSIS:    ICD-10-CM   1. 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, estrogen receptor positive ([place])  C50.511    Z17.0     SUMMARY OF ONCOLOGIC HISTORY: Oncology History  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, estrogen receptor positive ([place])  01/02/2017 Initial Diagnosis   Palpable right breast mass retroareolar 6:30 position: 3.6 cm size axilla negative, 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 HER-2 negative ratio 1.31 Ki-67 3% in addition calcifications UIQ 1.4 cm stereotactic biopsy flat epithelial atypia; clips are 4.3 cm apart, T2 N0 stage IB clinical stage AJCC 8   01/28/2017 Surgery   Bilateral mastectomies: Right: Grade 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%, PR 95%, HER-2 negative ratio 1.31, Ki-67 3%, 4/4 lymph nodes positive; left mastectomy: PASH and FC changes, no malignancy; T2 N2,  stage IIA AJCC 8   02/14/2017 Surgery   Right axillary lymph node dissection 8/11 lymph nodes positive   03/06/2017 - 07/25/2017 Chemotherapy   Dose dense AC x4 followed by Taxol x12   08/20/2017 - 10/02/2017 Radiation Therapy   Adj XRT   10/2017 -  Anti-estrogen oral therapy   Letrozole daily   05/12/2020 Genetic Testing   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  The CancerNext gene panel offered by [employer] includes sequencing and rearrangement analysis for the following 34 genes:   APC, ATM, BARD1, BMPR1A, BRCA1, BRCA2, BRIP1, CDH1, CDK4, CDKN2A, CHEK2, DICER1, HOXB13, EPCAM, GREM1, MLH1, MRE11A, MSH2, MSH6, MUTYH, NBN, NF1, PALB2, PMS2, POLD1, POLE, PTEN,  RAD50, RAD51C, RAD51D, SMAD4, SMARCA4, STK11, and TP53.  The report date is May 12, 2020.     CHIEF COMPLIANT: Follow-upof right breast canceron letrozole therapy  INTERVAL HISTORY: Angelica Floyd is a 55 y.o. with above-mentioned history of right breast cancer treated with bilateral mastectomies, right axillary lymph node dissection, adjuvant chemotherapy, radiation, and is currently on antiestrogen therapy with letrozole. She is a participant in the ABC clinical trial.Bone density scan on 07/07/20 showed osteopenia with a T-score of -1.3. She presents to the clinic todayfor follow-up.  She denies any lumps or nodules in the breast.    ALLERGIES:  has No Known Allergies.  MEDICATIONS:  Current Outpatient Medications  Medication Sig Dispense Refill  . acetaminophen (TYLENOL) 325 MG tablet Take 650 mg by mouth every 6 (six) hours as needed.    . calcium-vitamin D (OSCAL WITH D) 500-200 MG-UNIT tablet Take 1 tablet by mouth.     . cholecalciferol (VITAMIN D3) 25 MCG (1000 UT) tablet Take 1 tablet (1,000 Units total) by mouth daily.    Marland Kitchen ketotifen (ZADITOR) 0.025 % ophthalmic solution Place 2 drops into both eyes as needed (started using as needed for seasonal allergies in 2014).    Marland Kitchen letrozole (FEMARA) 2.5 MG tablet Take 1 tablet (2.5 mg total) by mouth daily. 90 tablet 3  . Multiple Vitamin (MULTIVITAMIN) tablet Take 1 tablet by mouth daily.    . vitamin C (ASCORBIC ACID) 500 MG tablet Take 1 tablet (500 mg total) by mouth daily.    Marland Kitchen zinc gluconate 50 MG tablet Take 1 tablet (50 mg total) by mouth  daily.     No current facility-administered medications for this visit.    PHYSICAL EXAMINATION: ECOG PERFORMANCE STATUS: 1 - Symptomatic but completely ambulatory  Vitals:   09/27/20 1329  BP: (!) 141/71  Pulse: 78  Resp: 18  Temp: 97.7 F (36.5 C)  SpO2: 100%   Filed Weights   09/27/20 1329  Weight: 131 lb 8 oz (59.6 kg)    BREAST: No palpable masses or nodules in either  right or left breasts. No palpable axillary supraclavicular or infraclavicular adenopathy no breast tenderness or nipple discharge. (exam performed in the presence of a chaperone)  LABORATORY DATA:  I have reviewed the data as listed CMP Latest Ref Rng & Units 07/25/2017 07/18/2017 07/11/2017  Glucose 70 - 140 mg/dL 103 101 106  BUN 7 - 26 mg/dL $Remove'12 9 9  'BliNMYT$ Creatinine 0.60 - 1.10 mg/dL 0.83 0.87 0.84  Sodium 136 - 145 mmol/L 140 139 139  Potassium 3.5 - 5.1 mmol/L 3.8 3.6 3.8  Chloride 98 - 109 mmol/L 105 104 104  CO2 22 - 29 mmol/L $RemoveB'27 26 27  'ripYjWwe$ Calcium 8.4 - 10.4 mg/dL 9.7 9.6 9.7  Total Protein 6.4 - 8.3 g/dL 6.7 6.7 6.7  Total Bilirubin 0.2 - 1.2 mg/dL 0.3 0.4 0.3  Alkaline Phos 40 - 150 U/L 54 57 63  AST 5 - 34 U/L $Remo'22 23 24  'FhCMP$ ALT 0 - 55 U/L $Remo'23 27 26    'Ryqww$ Lab Results  Component Value Date   WBC 3.1 (L) 07/25/2017   HGB 12.0 07/25/2017   HCT 35.0 07/25/2017   MCV 92.6 07/25/2017   PLT 233 07/25/2017   NEUTROABS 1.8 07/25/2017    ASSESSMENT & PLAN:  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, estrogen receptor positive ([place]) 01/28/2017: Bilateral mastectomies: Right: 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 adjuvant chemotherapy with dose dense Adriamycin and Cytoxan x4 followed by Taxol weekly x12completed 07/25/17 3. Adjuvant radiation4/9/19- 10/02/17 4. Adjuvant antiestrogen therapy with tamoxifen (which was originally started prior to surgery)switched to letrozole 10/26/2017 ABC clinical trial: Aspirin versus placebo started January  2020 ---------------------------------------------------------------------- Letrozoletoxicities: Muscle stiffness and achiness: She would like to try acupuncture.  I sent her contact information for that.  I also discussed with her about yoga and sun salutation's which can help alleviate the joint stiffness.  Occasional dizziness that lasts for split-second: I discussed with her about checking her blood pressure when she feels dizzy. Gaseousness: Discussed with her about using probiotics Geneti:Neg (VUS BMPR1A)  Breast cancer surveillance: 1.Breast exam  09/27/2020: Benign 2.No role of imaging studies because she had bilateral mastectomies  ABC clinical trial related toxicities: None Return to clinic in 1 year with follow-up    No orders of the defined types were placed in this encounter.  The patient has a good understanding of the overall plan. she agrees with it. she will call with any problems that may develop before the next visit here.  Total time spent: 20 mins including face to face time and time spent for planning, charting and coordination of care  Rulon Eisenmenger, MD, MPH 09/27/2020  I, Molly Dorshimer, am acting as scribe for Dr. Nicholas Lose.  I have reviewed the above documentation for accuracy and completeness, and I agree with the above.

## 2020-09-27 ENCOUNTER — Ambulatory Visit: Payer: BC Managed Care – PPO | Admitting: Hematology and Oncology

## 2020-09-27 ENCOUNTER — Telehealth: Payer: Self-pay | Admitting: Medical Oncology

## 2020-09-27 ENCOUNTER — Encounter: Payer: Self-pay | Admitting: Hematology and Oncology

## 2020-09-27 ENCOUNTER — Other Ambulatory Visit: Payer: Self-pay

## 2020-09-27 ENCOUNTER — Inpatient Hospital Stay: Payer: BC Managed Care – PPO | Attending: Hematology and Oncology | Admitting: Hematology and Oncology

## 2020-09-27 DIAGNOSIS — Z006 Encounter for examination for normal comparison and control in clinical research program: Secondary | ICD-10-CM | POA: Insufficient documentation

## 2020-09-27 DIAGNOSIS — Z923 Personal history of irradiation: Secondary | ICD-10-CM | POA: Insufficient documentation

## 2020-09-27 DIAGNOSIS — C50511 Malignant neoplasm of lower-outer quadrant of right female breast: Secondary | ICD-10-CM

## 2020-09-27 DIAGNOSIS — Z9221 Personal history of antineoplastic chemotherapy: Secondary | ICD-10-CM | POA: Insufficient documentation

## 2020-09-27 DIAGNOSIS — Z170 Estrogen receptor positive status [ER+]: Secondary | ICD-10-CM | POA: Insufficient documentation

## 2020-09-27 DIAGNOSIS — Z79811 Long term (current) use of aromatase inhibitors: Secondary | ICD-10-CM | POA: Diagnosis not present

## 2020-09-27 NOTE — Assessment & Plan Note (Signed)
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 adjuvant chemotherapy with dose dense Adriamycin and Cytoxan x4 followed by Taxol weekly x12completed 07/25/17 3. Adjuvant radiation4/9/19- 10/02/17 4. Adjuvant antiestrogen therapy with tamoxifen (which was originally started prior to surgery)switched to letrozole 10/26/2017 ABC clinical trial: Aspirin versus placebo started January 2020 ---------------------------------------------------------------------- Letrozoletoxicities: Muscle stiffness and achiness: She would like to try acupuncture.  I sent her contact information for that.  I also discussed with her about yoga and sun salutation's which can help alleviate the joint stiffness.  Occasional dizziness that lasts for split-second: I discussed with her about checking her blood pressure when she feels dizzy. Gaseousness: Discussed with her about using probiotics Genetic counseling: I recommended that she see a Dietitian for testing.  Breast cancer surveillance: 1.Breast exam  09/27/2020: Benign 2.No role of imaging studies because she had bilateral mastectomies  ABC clinical trial related toxicities: None Return to clinic inand get labs96months with follow-up

## 2020-09-27 NOTE — Telephone Encounter (Signed)
[id number]:A RANDOMIZED PHASE III DOUBLE BLINDED PLACEBO CONTROLLED TRIAL OF ASPIRIN AS ADJUVANT THERAPY FOR HER2 NEGATIVE BREAST CANCER: THE ABC TRIAL  Outgoing call: Call to patient for follow-up  Patient in clinic today to see MD for follow-up after study completion and review of any possible adverse events. I was scheduled to see patient when in clinic today, patient was rescheduled with MD and this research nurse was not made aware of new scheduled time. Patient did see MD and was assessed today by MD. Per today's MD note, patient with no study drug related adverse events.  I reviewed the study solicited AE's and confirmed with patient that she does not have any of listed AE's; patient denies GI bleeding, epistaxis, hematuria, dyspepsia, gastritis, bruising and no evidence/signs and symptoms of intracranial bruising. Patient confirms to be feeling well and denied have any questions at this time.  Study Drug/Placebo: Patient had returned all study dispensed study drug and medication diaries on April 20, 2020 (please see encounter note).  Unblinding Information: patient was informed of unblinding and that she was receiving the Placebo, on June 10, 2020 (please see phone note).   Patient was thanked for her time and her support of study and was encouraged to call with any questions or concerns she may have.  Maxwell Marion, RN, BSN, [facility] Clinical Research 09/27/2020 4:37 PM

## 2020-09-30 ENCOUNTER — Other Ambulatory Visit: Payer: Self-pay | Admitting: Hematology and Oncology

## 2020-12-26 ENCOUNTER — Other Ambulatory Visit: Payer: Self-pay | Admitting: Hematology and Oncology

## 2021-03-29 ENCOUNTER — Other Ambulatory Visit: Payer: Self-pay | Admitting: Hematology and Oncology

## 2021-09-06 ENCOUNTER — Telehealth: Payer: Self-pay | Admitting: Hematology and Oncology

## 2021-09-06 NOTE — Telephone Encounter (Signed)
Rescheduled appointment per provider template. Patient was scheduled during [facility]. Left message. ?

## 2021-09-27 ENCOUNTER — Ambulatory Visit: Payer: BC Managed Care – PPO | Admitting: Hematology and Oncology

## 2021-10-02 ENCOUNTER — Ambulatory Visit: Payer: BC Managed Care – PPO | Admitting: Hematology and Oncology

## 2021-10-18 ENCOUNTER — Telehealth: Payer: Self-pay | Admitting: Hematology and Oncology

## 2021-10-18 NOTE — Telephone Encounter (Signed)
Rescheduled appointment per provider PAL. Left message. 

## 2021-11-15 ENCOUNTER — Inpatient Hospital Stay: Payer: BC Managed Care – PPO | Admitting: Hematology and Oncology

## 2021-12-01 NOTE — Progress Notes (Incomplete)
Patient Care Team: April Manson, NP as PCP - General (Family Medicine) Serena Croissant, MD as Consulting Physician (Hematology and Oncology) Griselda Miner, MD as Consulting Physician (General Surgery) Antony Blackbird, MD as Consulting Physician (Radiation Oncology) Axel Filler Larna Daughters, NP as Nurse Practitioner (Hematology and Oncology)  DIAGNOSIS: No diagnosis found.  SUMMARY OF ONCOLOGIC HISTORY: Oncology History  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, estrogen receptor positive (HCC)  01/02/2017 Initial Diagnosis   Palpable right breast mass retroareolar 6:30 position: 3.6 cm size axilla negative, 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 HER-2 negative ratio 1.31 Ki-67 3% in addition calcifications UIQ 1.4 cm stereotactic biopsy flat epithelial atypia; clips are 4.3 cm apart, T2 N0 stage IB clinical stage AJCC 8   01/28/2017 Surgery   Bilateral mastectomies: Right: Grade 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%, PR 95%, HER-2 negative ratio 1.31, Ki-67 3%, 4/4 lymph nodes positive; left mastectomy: PASH and FC changes, no malignancy; T2 N2,  stage IIA AJCC 8   02/14/2017 Surgery   Right axillary lymph node dissection 8/11 lymph nodes positive   03/06/2017 - 07/25/2017 Chemotherapy   Dose dense AC x4 followed by Taxol x12   08/20/2017 - 10/02/2017 Radiation Therapy   Adj XRT   10/2017 -  Anti-estrogen oral therapy   Letrozole daily   05/12/2020 Genetic Testing   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  The CancerNext gene panel offered by [employer] includes sequencing and rearrangement analysis for the following 34 genes:   APC, ATM, BARD1, BMPR1A, BRCA1, BRCA2, BRIP1, CDH1, CDK4, CDKN2A, CHEK2, DICER1, HOXB13, EPCAM, GREM1, MLH1, MRE11A, MSH2, MSH6, MUTYH, NBN, NF1, PALB2, PMS2, POLD1, POLE, PTEN, RAD50, RAD51C, RAD51D, SMAD4, SMARCA4, STK11, and TP53.  The report date is May 12, 2020.     CHIEF COMPLIANT: Follow-up  of right breast cancer on letrozole therapy  INTERVAL HISTORY: Angelica Floyd is a 56 y.o. with above-mentioned history of right breast cancer. She presents to the clinic today for a follow-up.   ALLERGIES:  has No Known Allergies.  MEDICATIONS:  Current Outpatient Medications  Medication Sig Dispense Refill   acetaminophen (TYLENOL) 325 MG tablet Take 650 mg by mouth every 6 (six) hours as needed.     calcium-vitamin D (OSCAL WITH D) 500-200 MG-UNIT tablet Take 1 tablet by mouth.      cholecalciferol (VITAMIN D3) 25 MCG (1000 UT) tablet Take 1 tablet (1,000 Units total) by mouth daily.     ketotifen (ZADITOR) 0.025 % ophthalmic solution Place 2 drops into both eyes as needed (started using as needed for seasonal allergies in 2014).     letrozole (FEMARA) 2.5 MG tablet TAKE 1 TABLET BY MOUTH EVERY DAY 90 tablet 2   Multiple Vitamin (MULTIVITAMIN) tablet Take 1 tablet by mouth daily.     vitamin C (ASCORBIC ACID) 500 MG tablet Take 1 tablet (500 mg total) by mouth daily.     zinc gluconate 50 MG tablet Take 1 tablet (50 mg total) by mouth daily.     No current facility-administered medications for this visit.    PHYSICAL EXAMINATION: ECOG PERFORMANCE STATUS: {CHL ONC ECOG [phone]}  There were no vitals filed for this visit. There were no vitals filed for this visit.  BREAST:*** No palpable masses or nodules in either right or left breasts. No palpable axillary supraclavicular or infraclavicular adenopathy no breast tenderness or nipple discharge. (exam performed in the presence of a chaperone)  LABORATORY DATA:  I have reviewed the data as listed    Latest Ref Rng & Units 07/25/2017    8:33 AM 07/18/2017    8:43 AM 07/11/2017    8:09 AM  CMP  Glucose 70 - 140 mg/dL 103  101  106   BUN 7 - 26 mg/dL $Remove'12  9  9   'sZudihq$ Creatinine 0.60 - 1.10 mg/dL 0.83  0.87  0.84   Sodium 136 - 145 mmol/L 140  139  139   Potassium 3.5 - 5.1 mmol/L 3.8  3.6  3.8   Chloride 98 - 109 mmol/L 105  104   104   CO2 22 - 29 mmol/L $RemoveB'27  26  27   'bIiLDlua$ Calcium 8.4 - 10.4 mg/dL 9.7  9.6  9.7   Total Protein 6.4 - 8.3 g/dL 6.7  6.7  6.7   Total Bilirubin 0.2 - 1.2 mg/dL 0.3  0.4  0.3   Alkaline Phos 40 - 150 U/L 54  57  63   AST 5 - 34 U/L $Remo'22  23  24   'IayMw$ ALT 0 - 55 U/L $Remo'23  27  26     'rkDxu$ Lab Results  Component Value Date   WBC 3.1 (L) 07/25/2017   HGB 12.0 07/25/2017   HCT 35.0 07/25/2017   MCV 92.6 07/25/2017   PLT 233 07/25/2017   NEUTROABS 1.8 07/25/2017    ASSESSMENT & PLAN:  No problem-specific Assessment & Plan notes found for this encounter.    No orders of the defined types were placed in this encounter.  The patient has a good understanding of the overall plan. she agrees with it. she will call with any problems that may develop before the next visit here. Total time spent: 30 mins including face to face time and time spent for planning, charting and co-ordination of care   Suzzette Righter, [place] 12/01/21    I Gardiner Coins am scribing for Dr. Lindi Adie  ***

## 2021-12-04 ENCOUNTER — Other Ambulatory Visit: Payer: Self-pay

## 2021-12-04 ENCOUNTER — Inpatient Hospital Stay: Payer: BC Managed Care – PPO | Attending: Hematology and Oncology | Admitting: Hematology and Oncology

## 2021-12-04 VITALS — BP 130/86 | HR 88 | Temp 97.7°F | Resp 18 | Ht 63.0 in | Wt 129.1 lb

## 2021-12-04 DIAGNOSIS — Z9221 Personal history of antineoplastic chemotherapy: Secondary | ICD-10-CM | POA: Insufficient documentation

## 2021-12-04 DIAGNOSIS — Z923 Personal history of irradiation: Secondary | ICD-10-CM | POA: Insufficient documentation

## 2021-12-04 DIAGNOSIS — C50511 Malignant neoplasm of lower-outer quadrant of right female breast: Secondary | ICD-10-CM | POA: Insufficient documentation

## 2021-12-04 DIAGNOSIS — Z79811 Long term (current) use of aromatase inhibitors: Secondary | ICD-10-CM | POA: Insufficient documentation

## 2021-12-04 DIAGNOSIS — Z170 Estrogen receptor positive status [ER+]: Secondary | ICD-10-CM | POA: Diagnosis not present

## 2021-12-04 DIAGNOSIS — R232 Flushing: Secondary | ICD-10-CM | POA: Diagnosis not present

## 2021-12-04 DIAGNOSIS — Z780 Asymptomatic menopausal state: Secondary | ICD-10-CM | POA: Diagnosis not present

## 2021-12-04 MED ORDER — ANASTROZOLE 1 MG PO TABS: 1.0000 mg | ORAL_TABLET | Freq: Every day | ORAL | 3 refills | Status: DC

## 2021-12-04 NOTE — Progress Notes (Signed)
Signatera requistion and all required documentation faxed; confirmation received.

## 2021-12-04 NOTE — Assessment & Plan Note (Signed)
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 adjuvant chemotherapy with dose dense Adriamycin and Cytoxan x4 followed by Taxol weekly x12completed 07/25/17 3. Adjuvant radiation4/9/19- 10/02/17 4. Adjuvant antiestrogen therapy with tamoxifen (which was originally started prior to surgery)switched to letrozole 10/26/2017 ABC clinical trial: Aspirin versus placebo started January 2020 ---------------------------------------------------------------------- Letrozoletoxicities: Muscle stiffness and achiness:She would like to try acupuncture. I sent her contact information for that. I also discussed with her about yoga and sun salutation's which can help alleviate the joint stiffness.  Occasional dizziness that lasts for split-second: I discussed with her about checking her blood pressure when she feels dizzy. Gaseousness: Discussed with her about using probiotics Geneti:Neg (VUS BMPR1A)  Breast cancer surveillance: 1.Breast exam7/24/2023: Benign 2.No role of imaging studies because she had bilateral mastectomies  ABC clinical trial related toxicities: None Return to clinic in 1 year with follow-up

## 2022-01-02 ENCOUNTER — Telehealth: Payer: Self-pay

## 2022-01-02 NOTE — Telephone Encounter (Signed)
Spoke with representative from [place] requesting additional tissue for Signatera testing.  Reached out [facility] and received confirmation that they have another specimen available to send for testing.  Request successfully faxed to [facility].

## 2022-01-05 ENCOUNTER — Encounter (HOSPITAL_COMMUNITY): Payer: Self-pay

## 2022-02-06 ENCOUNTER — Telehealth: Payer: Self-pay

## 2022-02-06 ENCOUNTER — Encounter: Payer: Self-pay | Admitting: Hematology and Oncology

## 2022-02-06 NOTE — Telephone Encounter (Signed)
Pt's signatera testing resulted negative-not detected.  Attempted to call pt to give good news. LVM advising pt to return call.

## 2022-05-10 ENCOUNTER — Telehealth: Payer: Self-pay

## 2022-05-10 NOTE — Telephone Encounter (Signed)
Attempted to call pt per md regarding signatera results was negative lvm for pt to return call back if had further questions.

## 2022-05-22 ENCOUNTER — Encounter: Payer: Self-pay | Admitting: Hematology and Oncology

## 2022-07-11 ENCOUNTER — Ambulatory Visit
Admission: RE | Admit: 2022-07-11 | Discharge: 2022-07-11 | Disposition: A | Discharge status: Home / Self Care | Payer: BC Managed Care – PPO | Source: Ambulatory Visit | Attending: Hematology and Oncology | Admitting: Hematology and Oncology

## 2022-07-11 DIAGNOSIS — C50511 Malignant neoplasm of lower-outer quadrant of right female breast: Secondary | ICD-10-CM

## 2022-07-11 DIAGNOSIS — Z780 Asymptomatic menopausal state: Secondary | ICD-10-CM

## 2022-07-27 ENCOUNTER — Telehealth: Payer: Self-pay | Admitting: *Deleted

## 2022-07-27 NOTE — Telephone Encounter (Signed)
Per MD request, RN placed call to pt regarding negative (Not Detected) recent Signatera testing.  Pt appreciative of call and verbalized understanding.  

## 2022-08-02 ENCOUNTER — Encounter: Payer: Self-pay | Admitting: Hematology and Oncology

## 2022-10-19 ENCOUNTER — Telehealth: Payer: Self-pay

## 2022-10-19 NOTE — Telephone Encounter (Signed)
Pt called asking for recommendation for cardiologist for HLD. Pt was recommended to see Olga Millers, MD if he is seeing pts. Her PCP will enter the referral.

## 2022-11-01 ENCOUNTER — Telehealth: Payer: Self-pay | Admitting: *Deleted

## 2022-11-01 NOTE — Telephone Encounter (Signed)
Per MD request, RN placed call to pt regarding negative (Not Detected) recent Signatera testing.  Pt appreciative of call and verbalized understanding.  

## 2022-11-06 ENCOUNTER — Encounter: Payer: Self-pay | Admitting: Hematology and Oncology

## 2022-11-23 ENCOUNTER — Telehealth: Payer: Self-pay | Admitting: Hematology and Oncology

## 2022-11-23 NOTE — Telephone Encounter (Signed)
Rescheduled appointment per provider [facility]. Left voicemail. 

## 2022-12-05 ENCOUNTER — Inpatient Hospital Stay: Payer: BC Managed Care – PPO | Admitting: Hematology and Oncology

## 2022-12-17 NOTE — Progress Notes (Signed)
Patient Care Team: April Manson, NP as PCP - General (Family Medicine) Serena Croissant, MD as Consulting Physician (Hematology and Oncology) Griselda Miner, MD as Consulting Physician (General Surgery) Antony Blackbird, MD as Consulting Physician (Radiation Oncology) Axel Filler Larna Daughters, NP as Nurse Practitioner (Hematology and Oncology)  DIAGNOSIS: No diagnosis found.  SUMMARY OF ONCOLOGIC HISTORY: Oncology History  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, estrogen receptor positive (HCC)  01/02/2017 Initial Diagnosis   Palpable right breast mass retroareolar 6:30 position: 3.6 cm size axilla negative, 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 HER-2 negative ratio 1.31 Ki-67 3% in addition calcifications UIQ 1.4 cm stereotactic biopsy flat epithelial atypia; clips are 4.3 cm apart, T2 N0 stage IB clinical stage AJCC 8   01/28/2017 Surgery   Bilateral mastectomies: Right: Grade 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%, PR 95%, HER-2 negative ratio 1.31, Ki-67 3%, 4/4 lymph nodes positive; left mastectomy: PASH and FC changes, no malignancy; T2 N2,  stage IIA AJCC 8   02/14/2017 Surgery   Right axillary lymph node dissection 8/11 lymph nodes positive   03/06/2017 - 07/25/2017 Chemotherapy   Dose dense AC x4 followed by Taxol x12   08/20/2017 - 10/02/2017 Radiation Therapy   Adj XRT   10/2017 -  Anti-estrogen oral therapy   Letrozole daily   05/12/2020 Genetic Testing   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  The CancerNext gene panel offered by [employer] includes sequencing and rearrangement analysis for the following 34 genes:   APC, ATM, BARD1, BMPR1A, BRCA1, BRCA2, BRIP1, CDH1, CDK4, CDKN2A, CHEK2, DICER1, HOXB13, EPCAM, GREM1, MLH1, MRE11A, MSH2, MSH6, MUTYH, NBN, NF1, PALB2, PMS2, POLD1, POLE, PTEN, RAD50, RAD51C, RAD51D, SMAD4, SMARCA4, STK11, and TP53.  The report date is May 12, 2020.     CHIEF COMPLIANT: letrozole  therapy   INTERVAL HISTORY: Angelica Floyd is a 57 y.o. with above-mentioned history of right breast cancer. She presents to the clinic today for a follow-up.    ALLERGIES:  has No Known Allergies.  MEDICATIONS:  Current Outpatient Medications  Medication Sig Dispense Refill   anastrozole (ARIMIDEX) 1 MG tablet Take 1 tablet (1 mg total) by mouth daily. 90 tablet 3   calcium-vitamin D (OSCAL WITH D) 500-200 MG-UNIT tablet Take 1 tablet by mouth.      cholecalciferol (VITAMIN D3) 25 MCG (1000 UT) tablet Take 1 tablet (1,000 Units total) by mouth daily.     ketotifen (ZADITOR) 0.025 % ophthalmic solution Place 2 drops into both eyes as needed (started using as needed for seasonal allergies in 2014).     Multiple Vitamin (MULTIVITAMIN) tablet Take 1 tablet by mouth daily.     vitamin C (ASCORBIC ACID) 500 MG tablet Take 1 tablet (500 mg total) by mouth daily.     No current facility-administered medications for this visit.    PHYSICAL EXAMINATION: ECOG PERFORMANCE STATUS: {CHL ONC ECOG PS:[phone]}  There were no vitals filed for this visit. There were no vitals filed for this visit.  BREAST:*** No palpable masses or nodules in either right or left breasts. No palpable axillary supraclavicular or infraclavicular adenopathy no breast tenderness or nipple discharge. (exam performed in the presence of a chaperone)  LABORATORY DATA:  I have reviewed the data as listed    Latest Ref Rng & Units 07/25/2017    8:33 AM 07/18/2017    8:43 AM 07/11/2017    8:09 AM  CMP  Glucose  70 - 140 mg/dL 366  440  347   BUN 7 - 26 mg/dL 12  9  9    Creatinine 0.60 - 1.10 mg/dL 4.25  9.56  3.87   Sodium 136 - 145 mmol/L 140  139  139   Potassium 3.5 - 5.1 mmol/L 3.8  3.6  3.8   Chloride 98 - 109 mmol/L 105  104  104   CO2 22 - 29 mmol/L 27  26  27    Calcium 8.4 - 10.4 mg/dL 9.7  9.6  9.7   Total Protein 6.4 - 8.3 g/dL 6.7  6.7  6.7   Total Bilirubin 0.2 - 1.2 mg/dL 0.3  0.4  0.3   Alkaline Phos 40  - 150 U/L 54  57  63   AST 5 - 34 U/L 22  23  24    ALT 0 - 55 U/L 23  27  26      Lab Results  Component Value Date   WBC 3.1 (L) 07/25/2017   HGB 12.0 07/25/2017   HCT 35.0 07/25/2017   MCV 92.6 07/25/2017   PLT 233 07/25/2017   NEUTROABS 1.8 07/25/2017    ASSESSMENT & PLAN:  No problem-specific Assessment & Plan notes found for this encounter.    No orders of the defined types were placed in this encounter.  The patient has a good understanding of the overall plan. she agrees with it. she will call with any problems that may develop before the next visit here. Total time spent: 30 mins including face to face time and time spent for planning, charting and co-ordination of care   Sherlyn Lick, CMA 12/17/22    I Janan Ridge am acting as a Neurosurgeon for [employer]  ***

## 2022-12-18 ENCOUNTER — Other Ambulatory Visit: Payer: Self-pay

## 2022-12-18 ENCOUNTER — Inpatient Hospital Stay: Payer: BC Managed Care – PPO | Attending: Hematology and Oncology | Admitting: Hematology and Oncology

## 2022-12-18 VITALS — BP 146/80 | HR 82 | Temp 97.3°F | Resp 18 | Ht 63.0 in | Wt 129.3 lb

## 2022-12-18 DIAGNOSIS — Z9221 Personal history of antineoplastic chemotherapy: Secondary | ICD-10-CM | POA: Diagnosis not present

## 2022-12-18 DIAGNOSIS — Z79811 Long term (current) use of aromatase inhibitors: Secondary | ICD-10-CM | POA: Insufficient documentation

## 2022-12-18 DIAGNOSIS — Z170 Estrogen receptor positive status [ER+]: Secondary | ICD-10-CM | POA: Insufficient documentation

## 2022-12-18 DIAGNOSIS — R911 Solitary pulmonary nodule: Secondary | ICD-10-CM | POA: Insufficient documentation

## 2022-12-18 DIAGNOSIS — Z9013 Acquired absence of bilateral breasts and nipples: Secondary | ICD-10-CM | POA: Diagnosis not present

## 2022-12-18 DIAGNOSIS — C50511 Malignant neoplasm of lower-outer quadrant of right female breast: Secondary | ICD-10-CM | POA: Insufficient documentation

## 2022-12-18 DIAGNOSIS — Z923 Personal history of irradiation: Secondary | ICD-10-CM | POA: Insufficient documentation

## 2022-12-18 MED ORDER — ANASTROZOLE 1 MG PO TABS: 1.0000 mg | ORAL_TABLET | Freq: Every day | ORAL | 3 refills | Status: DC

## 2022-12-18 NOTE — Assessment & Plan Note (Addendum)
01/28/2017: Bilateral mastectomies: Right: Grade 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%, PR 95%, HER-2 negative ratio 1.31, Ki-67 3%, 4/4 lymph nodes positive; left mastectomy: PASH and FC changes, no malignancy; T2 N2,  stage IIA AJCC 8 02-14-17: 8/10 lymph nodes positive   CT chest 02/13/2017: 3 mm right middle lobe nodule likely benign benign cysts in the liver, ovarian cysts few small sclerotic lesions in the bone likely bone islands Bone scan 02/13/2017: No bone metastases   Treatment plan: 1. adjuvant chemotherapy with dose dense Adriamycin and Cytoxan x4 followed by Taxol weekly x12 completed 07/25/17 3. Adjuvant radiation 08/20/17- 10/02/17 4. Adjuvant antiestrogen therapy with tamoxifen (which was originally started prior to surgery) switched to letrozole 10/26/2017 switched to anastrozole 12/21/2021 ABC clinical trial: Aspirin versus placebo started January 2020 ---------------------------------------------------------------------- Anastrozole toxicities: Elevated cholesterol: Patient is extremely active and plays several hours of pickleball daily.  She is concerned that anastrozole is increasing her cholesterol levels.  She will discuss with her cardiologist about reducing cholesterol levels.  I discussed with her that there is no difference between the different aromatase inhibitors in terms of cholesterol issues.   Genetics:Neg (VUS BMPR1A)   Breast cancer surveillance: 1.  Breast exam 12/18/2022: Benign 2.  No role of imaging studies because she had bilateral mastectomies   ABC clinical trial related toxicities: None Return to clinic in 1 year with follow-up

## 2022-12-20 NOTE — Progress Notes (Deleted)
Julious Payer NP Reason for referral-hyperlipidemia  HPI: 57 year old female for evaluation of hyperlipidemia at request of Kathlen Brunswick NP.  Echocardiogram October 2018 showed normal LV function.  Calcium score June 2022 0.  Laboratories April 2024 showed TSH 2.84, lipoprotein a 146.9, total cholesterol 308, HDL 116, LDL 180, creatinine 0.96, alkaline phosphatase 126 but otherwise normal liver functions, hemoglobin 13.3.  Current Outpatient Medications  Medication Sig Dispense Refill   anastrozole (ARIMIDEX) 1 MG tablet Take 1 tablet (1 mg total) by mouth daily. 90 tablet 3   calcium-vitamin D (OSCAL WITH D) 500-200 MG-UNIT tablet Take 1 tablet by mouth.      cholecalciferol (VITAMIN D3) 25 MCG (1000 UT) tablet Take 1 tablet (1,000 Units total) by mouth daily.     ketotifen (ZADITOR) 0.025 % ophthalmic solution Place 2 drops into both eyes as needed (started using as needed for seasonal allergies in 2014).     loratadine (CLARITIN) 5 MG chewable tablet      Multiple Vitamin (MULTIVITAMIN) tablet Take 1 tablet by mouth daily.     Pediatric Multivitamins-Fl (MULTIVITAMIN + FLUORIDE) 0.25 MG CHEW      vitamin C (ASCORBIC ACID) 500 MG tablet Take 1 tablet (500 mg total) by mouth daily.     No current facility-administered medications for this visit.    No Known Allergies   Past Medical History:  Diagnosis Date   Anxiety    started at the time of her breast cancer diagnosis (August 2018)   Family history of breast cancer    High cholesterol    Per patient 02/19/18: Her LDL was 100, but HDL was elevated; no intervention at this time.   History of chemotherapy    finished chemo 07/29/2017   Hot flashes started after letrozole dosing began   per patient 02/19/18   Hypocalcemia    per patient 02/19/18: Her recent Calcium level was elevated per her PCP, so she takes only one calcium supplment daily (historically she took 2)   Insomnia started after breast cancer surgery   Per  02/19/18: due to anxiety and hot flashes   Joint pain started at time of letrozole start   per patient 02/19/18   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, estrogen receptor positive (HCC) 01/08/2017   Seasonal allergies    per patient 02/19/18: She has been experiencing for at least 20 years    Past Surgical History:  Procedure Laterality Date   ABDOMINAL HYSTERECTOMY  2015   partial; ovaires remain   ADENOIDECTOMY W/ MYRINGOTOMY  at age 18   APPENDECTOMY     7   AXILLARY LYMPH NODE DISSECTION Right 02/14/2017   Procedure: RIGHT AXILLARY LYMPH NODE DISSECTION ERAS PATHWAY;  Surgeon: Griselda Miner, MD;  Location: [facility] OR;  Service: General;  Laterality: Right;   CESAREAN SECTION  2005   MASTECTOMY W/ SENTINEL NODE BIOPSY Bilateral 01/28/2017   RIGHT BREAST BIOPSY   MASTECTOMY W/ SENTINEL NODE BIOPSY Bilateral 01/28/2017   Procedure: BILATERAL MASTECTOMY WITH RIGHT SENTINEL LYMPH NODE BIOPSY;  Surgeon: Griselda Miner, MD;  Location: [facility] OR;  Service: General;  Laterality: Bilateral;   PORT-A-CATH REMOVAL Left 08/08/2017   Procedure: REMOVAL PORT-A-CATH;  Surgeon: Griselda Miner, MD;  Location: [facility];  Service: General;  Laterality: Left;   PORTACATH PLACEMENT Left 02/14/2017   Procedure: INSERTION PORT-A-CATH;  Surgeon: Griselda Miner, MD;  Location: [facility] OR;  Service: General;  Laterality: Left;  RE-EXCISION OF BREAST CANCER,SUPERIOR MARGINS N/A 02/14/2017   Procedure: RE-EXCISION INFERIOR FLAP;  Surgeon: Griselda Miner, MD;  Location: [facility] OR;  Service: General;  Laterality: N/A;   WISDOM TOOTH EXTRACTION  at age 15    Social History   Socioeconomic History   Marital status: Married    Spouse name: Not on file   Number of children: Not on file   Years of education: Not on file   Highest education level: Not on file  Occupational History   Not on file  Tobacco Use   Smoking status: Never   Smokeless tobacco: Never  Vaping Use   Vaping  status: Never Used  Substance and Sexual Activity   Alcohol use: Not Currently   Drug use: No   Sexual activity: Not on file  Other Topics Concern   Not on file  Social History Narrative   Not on file   Social Determinants of Health   Financial Resource Strain: Low Risk  (09/10/2022)   Received from [facility]   Overall Financial Resource Strain (CARDIA)    Difficulty of Paying Living Expenses: Not hard at all  Food Insecurity: No Food Insecurity (09/10/2022)   Received from [facility], [facility]   Hunger Vital Sign    Worried About Running Out of Food in the Last Year: Never true    Ran Out of Food in the Last Year: Never true  Transportation Needs: No Transportation Needs (09/10/2022)   Received from [facility], [facility]   PRAPARE - Transportation    Lack of Transportation (Medical): No    Lack of Transportation (Non-Medical): No  Physical Activity: Sufficiently Active (09/10/2022)   Received from [facility], [facility]   Exercise Vital Sign    Days of Exercise per Week: 6 days    Minutes of Exercise per Session: 90 min  Stress: No Stress Concern Present (09/10/2022)   Received from [facility], [facility] - Occupational Stress Questionnaire    Feeling of Stress : Not at all  Social Connections: Socially Integrated (09/10/2022)   Received from [facility], [facility]   Social Network    How would you rate your social network (family, work, friends)?: Good participation with social networks  Intimate Partner Violence: Not At Risk (09/10/2022)   Received from [facility], [facility]   HITS    Over the last 12 months how often did your partner physically hurt you?: 1    Over the last 12 months how often did your partner insult you or talk down to you?: 1    Over the last 12 months how often did your partner threaten you with physical harm?: 1    Over the last 12 months how often did  your partner scream or curse at you?: 1    Family History  Problem Relation Age of Onset   Aneurysm Father 23       brain   Heart failure Maternal Grandmother    Heart failure Maternal Grandfather    Stroke Paternal Grandmother    Breast cancer Other        MGF's sister    ROS: no fevers or chills, productive cough, hemoptysis, dysphasia, odynophagia, melena, hematochezia, dysuria, hematuria, rash, seizure activity, orthopnea, PND, pedal edema, claudication. Remaining systems are negative.  Physical Exam:   There were no vitals taken for this visit.  General:  Well developed/well nourished in NAD Skin warm/dry Patient not  depressed No peripheral clubbing Back-normal HEENT-normal/normal eyelids Neck supple/normal carotid upstroke bilaterally; no bruits; no JVD; no thyromegaly chest - CTA/ normal expansion CV - RRR/normal S1 and S2; no murmurs, rubs or gallops;  PMI nondisplaced Abdomen -NT/ND, no HSM, no mass, + bowel sounds, no bruit 2+ femoral pulses, no bruits Ext-no edema, chords, 2+ DP Neuro-grossly nonfocal  ECG - personally reviewed  A/P  1 hyperlipidemia-  Olga Millers, MD

## 2022-12-28 ENCOUNTER — Ambulatory Visit: Payer: BC Managed Care – PPO | Admitting: Cardiology

## 2023-01-25 ENCOUNTER — Other Ambulatory Visit: Payer: Self-pay

## 2023-01-25 DIAGNOSIS — Z170 Estrogen receptor positive status [ER+]: Secondary | ICD-10-CM

## 2023-02-12 LAB — SIGNATERA ONLY (NATERA MANAGED)
SIGNATERA MTM READOUT: 0 MTM/ml
SIGNATERA TEST RESULT: NEGATIVE

## 2023-02-13 NOTE — Progress Notes (Signed)
Julious Payer NP Reason for referral-hyperlipidemia  HPI: 57 year old female for evaluation of hyperlipidemia at request of Kathlen Brunswick NP.  Echocardiogram October 2018 showed normal LV function.  Calcium score June 2022 0.  Laboratories April 2024 showed lipoprotein a 146.9, total cholesterol 308, LDL 180, HDL 116.  TSH 2.840.  Patient describes intermittent chest pain.  It is in the left chest area without radiation.  Typically occurs with stress and at night.  Lasts minutes and resolves spontaneously.  No associated symptoms.  She does not have exertional chest pain.  She denies dyspnea on exertion, orthopnea, PND, pedal edema or syncope.  Cardiology now asked to evaluate.  Current Outpatient Medications  Medication Sig Dispense Refill   anastrozole (ARIMIDEX) 1 MG tablet Take 1 tablet (1 mg total) by mouth daily. 90 tablet 3   calcium-vitamin D (OSCAL WITH D) 500-200 MG-UNIT tablet Take 1 tablet by mouth.      cholecalciferol (VITAMIN D3) 25 MCG (1000 UT) tablet Take 1 tablet (1,000 Units total) by mouth daily.     ketotifen (ZADITOR) 0.025 % ophthalmic solution Place 2 drops into both eyes as needed (started using as needed for seasonal allergies in 2014).     loratadine (CLARITIN) 5 MG chewable tablet      LORazepam (ATIVAN) 0.5 MG tablet Take 0.5 mg by mouth 2 (two) times daily.     Multiple Vitamin (MULTIVITAMIN) tablet Take 1 tablet by mouth daily.     vitamin C (ASCORBIC ACID) 500 MG tablet Take 1 tablet (500 mg total) by mouth daily.     Pediatric Multivitamins-Fl (MULTIVITAMIN + FLUORIDE) 0.25 MG CHEW  (Patient not taking: Reported on 02/25/2023)     No current facility-administered medications for this visit.    No Known Allergies   Past Medical History:  Diagnosis Date   Anxiety    started at the time of her breast cancer diagnosis (August 2018)   Family history of breast cancer    High cholesterol    Per patient 02/19/18: Her LDL was 100, but HDL was  elevated; no intervention at this time.   History of chemotherapy    finished chemo 07/29/2017   Hot flashes started after letrozole dosing began   per patient 02/19/18   Hypocalcemia    per patient 02/19/18: Her recent Calcium level was elevated per her PCP, so she takes only one calcium supplment daily (historically she took 2)   Insomnia started after breast cancer surgery   Per 02/19/18: due to anxiety and hot flashes   Joint pain started at time of letrozole start   per patient 02/19/18   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, estrogen receptor positive (HCC) 01/08/2017   Seasonal allergies    per patient 02/19/18: She has been experiencing for at least 20 years    Past Surgical History:  Procedure Laterality Date   ABDOMINAL HYSTERECTOMY  2015   partial; ovaires remain   ADENOIDECTOMY W/ MYRINGOTOMY  at age 60   APPENDECTOMY     89   AXILLARY LYMPH NODE DISSECTION Right 02/14/2017   Procedure: RIGHT AXILLARY LYMPH NODE DISSECTION ERAS PATHWAY;  Surgeon: Griselda Miner, MD;  Location: [facility] OR;  Service: General;  Laterality: Right;   CESAREAN SECTION  2005   MASTECTOMY W/ SENTINEL NODE BIOPSY Bilateral 01/28/2017   RIGHT BREAST BIOPSY   MASTECTOMY W/ SENTINEL NODE BIOPSY Bilateral 01/28/2017   Procedure: BILATERAL MASTECTOMY WITH RIGHT SENTINEL LYMPH NODE BIOPSY;  Surgeon: Griselda Miner, MD;  Location: [facility] OR;  Service: General;  Laterality: Bilateral;   PORT-A-CATH REMOVAL Left 08/08/2017   Procedure: REMOVAL PORT-A-CATH;  Surgeon: Griselda Miner, MD;  Location: [facility];  Service: General;  Laterality: Left;   PORTACATH PLACEMENT Left 02/14/2017   Procedure: INSERTION PORT-A-CATH;  Surgeon: Griselda Miner, MD;  Location: [facility] OR;  Service: General;  Laterality: Left;   RE-EXCISION OF BREAST CANCER,SUPERIOR MARGINS N/A 02/14/2017   Procedure: RE-EXCISION INFERIOR FLAP;  Surgeon: Griselda Miner, MD;  Location: [facility] OR;  Service: General;   Laterality: N/A;   WISDOM TOOTH EXTRACTION  at age 49    Social History   Socioeconomic History   Marital status: Married    Spouse name: Not on file   Number of children: 2   Years of education: Not on file   Highest education level: Not on file  Occupational History   Not on file  Tobacco Use   Smoking status: Never   Smokeless tobacco: Never  Vaping Use   Vaping status: Never Used  Substance and Sexual Activity   Alcohol use: Yes    Comment: Occasional   Drug use: No   Sexual activity: Not on file  Other Topics Concern   Not on file  Social History Narrative   Not on file   Social Determinants of Health   Financial Resource Strain: Low Risk  (09/10/2022)   Received from [facility], [facility]   Overall Financial Resource Strain (CARDIA)    Difficulty of Paying Living Expenses: Not hard at all  Food Insecurity: No Food Insecurity (09/10/2022)   Received from [facility], [facility]   Hunger Vital Sign    Worried About Running Out of Food in the Last Year: Never true    Ran Out of Food in the Last Year: Never true  Transportation Needs: No Transportation Needs (09/10/2022)   Received from [facility], [facility]   PRAPARE - Transportation    Lack of Transportation (Medical): No    Lack of Transportation (Non-Medical): No  Physical Activity: Sufficiently Active (09/10/2022)   Received from [facility], [facility]   Exercise Vital Sign    Days of Exercise per Week: 6 days    Minutes of Exercise per Session: 90 min  Stress: No Stress Concern Present (09/10/2022)   Received from [facility], [facility] - Occupational Stress Questionnaire    Feeling of Stress : Not at all  Social Connections: Socially Integrated (09/10/2022)   Received from [facility], [facility]   Social Network    How would you rate your social network (family, work, friends)?: Good participation with social networks  Intimate  Partner Violence: Not At Risk (09/10/2022)   Received from [facility], [facility]   HITS    Over the last 12 months how often did your partner physically hurt you?: 1    Over the last 12 months how often did your partner insult you or talk down to you?: 1    Over the last 12 months how often did your partner threaten you with physical harm?: 1    Over the last 12 months how often did your partner scream or curse at you?: 1    Family History  Problem Relation Age of Onset   Coronary artery disease Mother    Aneurysm Father 15       brain   Heart failure Maternal Grandmother  Heart failure Maternal Grandfather    Stroke Paternal Grandmother    Breast cancer Other        MGF's sister    ROS: no fevers or chills, productive cough, hemoptysis, dysphasia, odynophagia, melena, hematochezia, dysuria, hematuria, rash, seizure activity, orthopnea, PND, pedal edema, claudication. Remaining systems are negative.  Physical Exam:   Blood pressure 132/84, pulse 89, height 5\' 4"  (1.626 m), weight 129 lb 9.6 oz (58.8 kg), SpO2 98%.  General:  Well developed/well nourished in NAD Skin warm/dry Patient not depressed No peripheral clubbing Back-normal HEENT-normal/normal eyelids Neck supple/normal carotid upstroke bilaterally; no bruits; no JVD; no thyromegaly chest - CTA/ normal expansion CV - RRR/normal S1 and S2; no murmurs, rubs or gallops;  PMI nondisplaced Abdomen -NT/ND, no HSM, no mass, + bowel sounds, no bruit 2+ femoral pulses, no bruits Ext-no edema, chords, 2+ DP Neuro-grossly nonfocal  EKG Interpretation Date/Time:  Monday February 25 2023 10:01:31 EDT Ventricular Rate:  89 PR Interval:  132 QRS Duration:  86 QT Interval:  364 QTC Calculation: 442 R Axis:   79  Text Interpretation: Normal sinus rhythm with sinus arrhythmia Confirmed by Olga Millers ([id number]) on 02/25/2023 10:03:49 AM    A/P  1 chest pain-symptoms are somewhat atypical.  However she does have a  family history of coronary disease and also significant hyperlipidemia.  We will arrange a coronary CTA to rule out obstructive coronary disease.  2 hyperlipidemia-LDL is 180.  LP(a) is also elevated.  We will begin Crestor 20 mg daily.  She is somewhat hesitant as she is concerned about potential side effects.  If she does not tolerate we will consider PCSK9 inhibitor.  Recheck lipids, liver and LP(a) in 8 weeks.  Olga Millers, MD

## 2023-02-15 ENCOUNTER — Telehealth: Payer: Self-pay

## 2023-02-15 NOTE — Telephone Encounter (Signed)
Attempted to call pt regarding signatera results lvm for pt that results were negative.

## 2023-02-18 ENCOUNTER — Encounter: Payer: Self-pay | Admitting: Hematology and Oncology

## 2023-02-25 ENCOUNTER — Ambulatory Visit: Payer: BC Managed Care – PPO | Attending: Cardiology | Admitting: Cardiology

## 2023-02-25 ENCOUNTER — Encounter: Payer: Self-pay | Admitting: Hematology and Oncology

## 2023-02-25 ENCOUNTER — Encounter: Payer: Self-pay | Admitting: Cardiology

## 2023-02-25 VITALS — BP 132/84 | HR 89 | Ht 64.0 in | Wt 129.6 lb

## 2023-02-25 DIAGNOSIS — R072 Precordial pain: Secondary | ICD-10-CM

## 2023-02-25 DIAGNOSIS — E785 Hyperlipidemia, unspecified: Secondary | ICD-10-CM

## 2023-02-25 MED ORDER — METOPROLOL TARTRATE 100 MG PO TABS: ORAL_TABLET | ORAL | 0 refills | Status: DC

## 2023-02-25 MED ORDER — ROSUVASTATIN CALCIUM 20 MG PO TABS: 20.0000 mg | ORAL_TABLET | Freq: Every day | ORAL | 3 refills | Status: DC

## 2023-02-25 NOTE — Patient Instructions (Addendum)
Medication Instructions:  Start rosuvastatin 20 mg daily  *If you need a refill on your cardiac medications before your next appointment, please call your pharmacy*   Lab Work: Your physician recommends that you return for lab work in: 8 weeks fasting  If you have labs (blood work) drawn today and your tests are completely normal, you will receive your results only by: MyChart Message (if you have MyChart) OR A paper copy in the mail If you have any lab test that is abnormal or we need to change your treatment, we will call you to review the results.   Testing/Procedures:    Your cardiac CT will be scheduled at   [facility] [address] [place], [place] [address] [phone]   If scheduled at [facility], please arrive at the [facility] (Entrance C2) of [facility] 30 minutes prior to test start time. You can use the FREE valet parking offered at entrance C (encouraged to control the heart rate for the test)  Proceed to the [facility] Radiology Department (first floor) to check-in and test prep.  All radiology patients and guests should use entrance C2 at [facility], accessed from [facility], even though the hospital's physical address listed is [address]      Please follow these instructions carefully (unless otherwise directed):  An IV will be required for this test and Nitroglycerin will be given.    On the Night Before the Test: Be sure to Drink plenty of water. Do not consume any caffeinated/decaffeinated beverages or chocolate 12 hours prior to your test. Do not take any antihistamines 12 hours prior to your test.   On the Day of the Test: Drink plenty of water until 1 hour prior to the test. Do not eat any food 1 hour prior to test. You may take your regular medications prior to the test.  Take metoprolol (Lopressor) 100 mg two hours prior to test. FEMALES- please wear  underwire-free bra if available, avoid dresses & tight clothing   After the Test: Drink plenty of water. After receiving IV contrast, you may experience a mild flushed feeling. This is normal. On occasion, you may experience a mild rash up to 24 hours after the test. This is not dangerous. If this occurs, you can take Benadryl 25 mg and increase your fluid intake. If you experience trouble breathing, this can be serious. If it is severe call 911 IMMEDIATELY. If it is mild, please call our office.   We will call to schedule your test 2-4 weeks out understanding that some insurance companies will need an authorization prior to the service being performed.   For more information and frequently asked questions, please visit our website : [email or web address]  For non-scheduling related questions, please contact the cardiac imaging nurse navigator should you have any questions/concerns: Cardiac Imaging Nurse Navigators Direct Office Dial: [phone]   For scheduling needs, including cancellations and rescheduling, please call [place], [phone].   Follow-Up: At [facility], you and your health needs are our priority.  As part of our continuing mission to provide you with exceptional heart care, we have created designated Provider Care Teams.  These Care Teams include your primary Cardiologist (physician) and Advanced Practice Providers (APPs -  Physician Assistants and Nurse Practitioners) who all work together to provide you with the care you need, when you need it.  We recommend signing up for the patient portal called "MyChart".  Sign up information is provided on this After Visit Summary.  MyChart is used to connect with patients for Virtual Visits (Telemedicine).  Patients are able to view lab/test results, encounter notes, upcoming appointments, etc.  Non-urgent messages can be sent to your provider as well.   To learn more about what you can do with MyChart, go  to [email or web address].    Your next appointment:   1 year(s)  Provider:   Olga Millers

## 2023-02-28 ENCOUNTER — Ambulatory Visit: Payer: BC Managed Care – PPO | Admitting: Cardiology

## 2023-03-05 ENCOUNTER — Encounter (HOSPITAL_COMMUNITY): Payer: Self-pay

## 2023-03-07 ENCOUNTER — Ambulatory Visit (HOSPITAL_COMMUNITY)
Admission: RE | Admit: 2023-03-07 | Discharge: 2023-03-07 | Disposition: A | Discharge status: Home / Self Care | Payer: BC Managed Care – PPO | Source: Ambulatory Visit | Attending: Cardiology | Admitting: Cardiology

## 2023-03-07 DIAGNOSIS — R072 Precordial pain: Secondary | ICD-10-CM | POA: Insufficient documentation

## 2023-03-07 MED ORDER — NITROGLYCERIN 0.4 MG SL SUBL
0.8000 mg | SUBLINGUAL_TABLET | Freq: Once | SUBLINGUAL | Status: AC
  Administered 2023-03-07: 0.8 mg via SUBLINGUAL

## 2023-03-07 MED ORDER — METOPROLOL TARTRATE 5 MG/5ML IV SOLN
5.0000 mg | Freq: Once | INTRAVENOUS | Status: AC
  Administered 2023-03-07: 5 mg via INTRAVENOUS

## 2023-03-07 MED ORDER — IOHEXOL 350 MG/ML SOLN
95.0000 mL | Freq: Once | INTRAVENOUS | Status: AC | PRN
Start: 2023-03-07 — End: 2023-03-07
  Administered 2023-03-07: 95 mL via INTRAVENOUS

## 2023-03-07 MED ORDER — NITROGLYCERIN 0.4 MG SL SUBL
SUBLINGUAL_TABLET | SUBLINGUAL | Status: AC
  Filled 2023-03-07: qty 2

## 2023-03-07 MED ORDER — METOPROLOL TARTRATE 5 MG/5ML IV SOLN
INTRAVENOUS | Status: AC
  Filled 2023-03-07: qty 10

## 2023-04-23 LAB — LIPOPROTEIN A (LPA): Lipoprotein (a): 225.2 nmol/L — ABNORMAL HIGH (ref <75.0)

## 2023-04-23 LAB — HEPATIC FUNCTION PANEL
ALT: 23 [IU]/L (ref 0–32)
AST: 30 [IU]/L (ref 0–40)
Albumin: 4.7 g/dL (ref 3.8–4.9)
Alkaline Phosphatase: 113 [IU]/L (ref 44–121)
Bilirubin Total: 0.4 mg/dL (ref 0.0–1.2)
Bilirubin, Direct: 0.15 mg/dL (ref 0.00–0.40)
Total Protein: 6.9 g/dL (ref 6.0–8.5)

## 2023-04-23 LAB — LIPID PANEL
Chol/HDL Ratio: 2.2 {ratio} (ref 0.0–4.4)
Cholesterol, Total: 219 mg/dL — ABNORMAL HIGH (ref 100–199)
HDL: 98 mg/dL (ref >39)
LDL Chol Calc (NIH): 111 mg/dL — ABNORMAL HIGH (ref 0–99)
Triglycerides: 57 mg/dL (ref 0–149)
VLDL Cholesterol Cal: 10 mg/dL (ref 5–40)

## 2023-04-25 ENCOUNTER — Encounter: Payer: Self-pay | Admitting: *Deleted

## 2023-08-07 LAB — SIGNATERA
SIGNATERA MTM READOUT: 0 MTM/ml
SIGNATERA TEST RESULT: NEGATIVE

## 2023-12-17 ENCOUNTER — Inpatient Hospital Stay: Attending: Hematology and Oncology | Admitting: Hematology and Oncology

## 2023-12-17 VITALS — BP 109/67 | HR 68 | Temp 97.9°F | Resp 16 | Ht 63.0 in | Wt 131.6 lb

## 2023-12-17 DIAGNOSIS — Z9221 Personal history of antineoplastic chemotherapy: Secondary | ICD-10-CM | POA: Insufficient documentation

## 2023-12-17 DIAGNOSIS — Z780 Asymptomatic menopausal state: Secondary | ICD-10-CM

## 2023-12-17 DIAGNOSIS — Z923 Personal history of irradiation: Secondary | ICD-10-CM | POA: Insufficient documentation

## 2023-12-17 DIAGNOSIS — Z170 Estrogen receptor positive status [ER+]: Secondary | ICD-10-CM | POA: Diagnosis not present

## 2023-12-17 DIAGNOSIS — E7800 Pure hypercholesterolemia, unspecified: Secondary | ICD-10-CM | POA: Insufficient documentation

## 2023-12-17 DIAGNOSIS — G4700 Insomnia, unspecified: Secondary | ICD-10-CM | POA: Insufficient documentation

## 2023-12-17 DIAGNOSIS — C50511 Malignant neoplasm of lower-outer quadrant of right female breast: Secondary | ICD-10-CM | POA: Diagnosis present

## 2023-12-17 DIAGNOSIS — Z9013 Acquired absence of bilateral breasts and nipples: Secondary | ICD-10-CM | POA: Insufficient documentation

## 2023-12-17 DIAGNOSIS — Z79811 Long term (current) use of aromatase inhibitors: Secondary | ICD-10-CM | POA: Diagnosis not present

## 2023-12-17 DIAGNOSIS — Z79899 Other long term (current) drug therapy: Secondary | ICD-10-CM | POA: Diagnosis not present

## 2023-12-17 DIAGNOSIS — Z1722 Progesterone receptor negative status: Secondary | ICD-10-CM | POA: Insufficient documentation

## 2023-12-17 DIAGNOSIS — R232 Flushing: Secondary | ICD-10-CM | POA: Insufficient documentation

## 2023-12-17 DIAGNOSIS — R911 Solitary pulmonary nodule: Secondary | ICD-10-CM | POA: Diagnosis not present

## 2023-12-17 MED ORDER — ANASTROZOLE 1 MG PO TABS: 1.0000 mg | ORAL_TABLET | Freq: Every day | ORAL | 3 refills | Status: DC

## 2023-12-17 MED ORDER — GABAPENTIN 100 MG PO CAPS: 100.0000 mg | ORAL_CAPSULE | Freq: Every day | ORAL | 3 refills | Status: AC

## 2023-12-17 NOTE — Progress Notes (Signed)
 CC: Breast and Pelvic Exam  Chief Complaint  Patient presents with  . Annual Exam    Hysterectomy Mastectomy COLO: current per pt    Subjective:   HPI: Angelica Floyd is a 58 y.o. y/o G2P2002 who presents today for annual exam No LMP recorded. Patient has had a hysterectomy.   She states she is doing well without any major medical complaints. Pt denies any vaginal bleeding.   She denies any bladder complaints. She does not have any urgency, frequency, hematuria, and stress urinary incontinence. She denies any bowel complaints. She denies any vaginal discharge. Patient denies any breast pains or masses.  She is sexually active without any problems and denies dyspareunia  or change in desire.  ______________________________________________________________________  Surgical History[1]  Allergies[2]  Medical History[3]  OB History  Gravida Para Term Preterm AB Living  2 2 2  0 0 2  SAB IAB Ectopic Molar Multiple Live Births  0 0 0 0  2    # Outcome Date GA Lbr Len/2nd Weight Sex Type Anes PTL Lv  2 Term           1 Term               Family History[4]  Social History   Socioeconomic History  . Marital status: Married    Spouse name: Not on file  . Number of children: Not on file  . Years of education: Not on file  . Highest education level: Not on file  Occupational History  . Not on file  Tobacco Use  . Smoking status: Never  . Smokeless tobacco: Never  Substance and Sexual Activity  . Alcohol use: Yes  . Drug use: No  . Sexual activity: Yes    Partners: Male    Birth control/protection: Surgical  Other Topics Concern  . Not on file  Social History Narrative  . Not on file   Social Drivers of Health   Food Insecurity: Not on file  Transportation Needs: Not on file  Safety: Not on file  Living Situation: Not on file    Current Medications[5]    Personal info:   2 kids, married, 20, 50 ( one lives in [place]), [place]      Review Of System:  General: no fever, fatigue, weight or appetite changes Skin: no rashes, lesions, itching, mole change HEENT: no frequent headaches, hearing changes, sore throat, dental problems, sinus problems   Lymph: no tender or swollen lymph nodes  Breasts: no lumps, nipple discharge, breast pain Resp: no SOB, cough, wheezing Cardio: no chest pain, palpitations, edema GI: no N/V/D, constipation, anorexia, bloating, reflux, blood in stool, leakage of stool  GU: no frequency, dysuria, flank pain, hematuria, incontinence, retention       GYN: no change in discharge or odor, itching, inter-menstrual or post-coital bleeding, dyspareunia  Musk: no weakness, arthralgia, joint swelling, ROM limitations Endocrine: no polydypsia, polyphagia, polyuria, heat/cold intolerance, hot flashes, dry skin Psych: no depression, anxiety, panic attacks  Objective:   PHYSICAL EXAM: BP 136/72   Ht 1.6 m (5' 3)   Wt 60.3 kg (133 lb)   BMI 23.56 kg/m  Constitutional: Well-developed, well-nourished female in no acute distress Neurological: Alert and oriented to person, place, and time, affect appropriate Psychiatric: Mood and affect appropriate Skin: No rashes or lesions, No suspicious lesions, masses, or ulcers.  Neck: Supple without masses. Trachea is midline.Thyroid is normal size without masses, carotid pulses normal and no  bruits Lymphatics: No cervical, axillary, supraclavicular, or inguinal adenopathy noted Respiratory: Clear to auscultation bilaterally. Good air movement with normal work of  breathing. Cardiovascular: Regular rate and rhythm. Extremities grossly normal, nontender with no edema; pulses regular Gastrointestinal: Soft, nontender, nondistended. No masses or hernias appreciated. No hepatosplenomegaly. No fluid wave. No rebound or guarding. Breast Exam: Appear normal and symmetric without palpable masses, skin changes or nipple inversion.  No discharge, rash, or  skin retraction. No palpable lymph nodes. No tenderness, masses, or nipple abnormality Genitourinary:         External Genitalia: Normal female genitalia w/o masses, lesions, rashes    Urethral Meatus: Normal caliber and position    Urethra: Midline, no masses    Bladder: Well-suspended, NT    Vagina: mucosa pink and moist without lesions or ulcers     Cervix: No lesions, normal size and consistency; no cervical motion tenderness     Uterus: Normal size and contour; smooth, mobile, NT Adnexa/Parametria: No masses; no parametrial nodularity; no tenderness Perineum/Anus: No lesions, no hemorids, normal specter tone   Chaperone present during exam.   RECENT LABS: No results found for this or any previous visit.    Assessment/Plan:  Angelica Floyd is a 58 y.o. female G2P2002 who presents for annual exam, history of breast cancer, vaginal atrophy     Pt will follow in 1 year for a physical exam and knows the importance of getting her routine screening labs and procedures.  Calcium  supplementation, STD prevention, importance of avoiding cigarettes, avoiding a high fat diet and exercise were discussed with the patient.  She was told that she needed to do monthly breast exams, get a mammogram yearly starting at age 76, get a lipid profile every 3-5 years, and needed a colonoscopy at 51.  >  Pap smear:  no  Via cream        [1] Past Surgical History: Procedure Laterality Date  . APPENDECTOMY     Procedure: APPENDECTOMY  . BREAST SURGERY     Procedure: BREAST SURGERY; mastectomy  . CESAREAN SECTION, UNSPECIFIED     Procedure: CESAREAN SECTION  . HYSTERECTOMY      Procedure: HYSTERECTOMY  [2] No Known Allergies [3] Past Medical History: Diagnosis Date  . Cancer    (CMD)   [4] Family History Problem Relation Name Age of Onset  . Diabetes Mother    . Heart disease Mother    . Brain Aneurysm Father    . Breast cancer Neg Hx    . Colon cancer Neg Hx    [5]  Current  Outpatient Medications:  .  anastrozole  (ARIMIDEX ) 1 mg tablet, , Disp: , Rfl:  .  calcium  carbonate-vitamin D3 (Oyster Shell Calcium -Vit D3) 500 mg (200 mg Ca)-5 mcg (200 units Vit D) per tablet, Take  by mouth., Disp: , Rfl:  .  gabapentin  (NEURONTIN ) 100 mg capsule, Take 100 mg by mouth., Disp: , Rfl:  .  loratadine (CLARITIN) 10 mg tablet, Take 10 mg by mouth Once Daily., Disp: , Rfl:  .  LORazepam  (ATIVAN ) 0.5 mg tablet, Take 0.5 mg by mouth 2 (two) times a day., Disp: , Rfl:  .  multivitamin cap, Take  by mouth., Disp: , Rfl:  .  rosuvastatin  (CRESTOR ) 20 mg tablet, , Disp: , Rfl:

## 2023-12-17 NOTE — Progress Notes (Signed)
 [facility]  Patient Care Team: Teresa Aldona CROME, NP as PCP - General (Family Medicine) Pietro Redell RAMAN, MD as PCP - Cardiology (Cardiology) Odean Potts, MD as Consulting Physician (Hematology and Oncology) Curvin Deward MOULD, MD as Consulting Physician (General Surgery) Shannon Agent, MD as Consulting Physician (Radiation Oncology) Crawford Morna Pickle, NP as Nurse Practitioner (Hematology and Oncology)  DIAGNOSIS:  Encounter Diagnosis  Name Primary?   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, estrogen receptor positive (HCC) Yes    SUMMARY OF ONCOLOGIC HISTORY: Oncology History  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, estrogen receptor positive (HCC)  01/02/2017 Initial Diagnosis   Palpable right breast mass retroareolar 6:30 position: 3.6 cm size axilla negative, 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 HER-2 negative ratio 1.31 Ki-67 3% in addition calcifications UIQ 1.4 cm stereotactic biopsy flat epithelial atypia; clips are 4.3 cm apart, T2 N0 stage IB clinical stage AJCC 8   01/28/2017 Surgery   Bilateral mastectomies: Right: Grade 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%, PR 95%, HER-2 negative ratio 1.31, Ki-67 3%, 4/4 lymph nodes positive; left mastectomy: PASH and FC changes, no malignancy; T2 N2,  stage IIA AJCC 8   02/14/2017 Surgery   Right axillary lymph node dissection 8/11 lymph nodes positive   03/06/2017 - 07/25/2017 Chemotherapy   Dose dense AC x4 followed by Taxol  x12   08/20/2017 - 10/02/2017 Radiation Therapy   Adj XRT   10/2017 -  Anti-estrogen oral therapy   Letrozole  daily   05/12/2020 Genetic Testing   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  The CancerNext gene panel offered by [employer] includes sequencing and rearrangement analysis for the following 34 genes:   APC, ATM, BARD1, BMPR1A, BRCA1, BRCA2, BRIP1, CDH1, CDK4, CDKN2A, CHEK2, DICER1, HOXB13, EPCAM, GREM1, MLH1, MRE11A,  MSH2, MSH6, MUTYH, NBN, NF1, PALB2, PMS2, POLD1, POLE, PTEN, RAD50, RAD51C, RAD51D, SMAD4, SMARCA4, STK11, and TP53.  The report date is May 12, 2020.     CHIEF COMPLIANT:   HISTORY OF PRESENT ILLNESS:  History of Present Illness Angelica Floyd is a 58 year old female with breast cancer who presents for follow-up regarding her hormone therapy and management of associated symptoms.  She has been on hormone therapy for nearly seven years, initially with tamoxifen  and currently with letrozole . She experiences hot flashes, poor sleep, and joint pain, which she attributes to menopause and her medication. She wakes up with hot flashes and has difficulty falling back asleep.  She occasionally experiences a cramp-like pain in her chest area when putting on a sweaty t-shirt, which resolves with stretching and is infrequent.     ALLERGIES:  has no known allergies.  MEDICATIONS:  Current Outpatient Medications  Medication Sig Dispense Refill   anastrozole  (ARIMIDEX ) 1 MG tablet Take 1 tablet (1 mg total) by mouth daily. 90 tablet 3   calcium -vitamin D  (OSCAL WITH D) 500-200 MG-UNIT tablet Take 1 tablet by mouth.      cholecalciferol (VITAMIN D3) 25 MCG (1000 UT) tablet Take 1 tablet (1,000 Units total) by mouth daily.     ketotifen (ZADITOR) 0.025 % ophthalmic solution Place 2 drops into both eyes as needed (started using as needed for seasonal allergies in 2014).     loratadine (CLARITIN) 5 MG chewable tablet      Multiple Vitamin (MULTIVITAMIN) tablet Take 1 tablet by mouth daily.     rosuvastatin  (CRESTOR ) 20 MG tablet Take 1 tablet (20  mg total) by mouth daily. 90 tablet 3   vitamin C  (ASCORBIC ACID) 500 MG tablet Take 1 tablet (500 mg total) by mouth daily.     LORazepam  (ATIVAN ) 0.5 MG tablet Take 0.5 mg by mouth 2 (two) times daily. (Patient not taking: Reported on 12/17/2023)     No current facility-administered medications for this visit.    PHYSICAL EXAMINATION: ECOG  PERFORMANCE STATUS: 1 - Symptomatic but completely ambulatory  Vitals:   12/17/23 0829  BP: 109/67  Pulse: 68  Resp: 16  Temp: 97.9 F (36.6 C)  SpO2: 100%   Filed Weights   12/17/23 0829  Weight: 131 lb 9.6 oz (59.7 kg)    Physical Exam MUSCULOSKELETAL: Chest wall normal, no abnormalities. SKIN: Skin normal, no concerns.  (exam performed in the presence of a chaperone)  LABORATORY DATA:  I have reviewed the data as listed    Latest Ref Rng & Units 04/22/2023    8:27 AM 07/25/2017    8:33 AM 07/18/2017    8:43 AM  CMP  Glucose 70 - 140 mg/dL  896  898   BUN 7 - 26 mg/dL  12  9   Creatinine 9.39 - 1.10 mg/dL  9.16  9.12   Sodium 863 - 145 mmol/L  140  139   Potassium 3.5 - 5.1 mmol/L  3.8  3.6   Chloride 98 - 109 mmol/L  105  104   CO2 22 - 29 mmol/L  27  26   Calcium  8.4 - 10.4 mg/dL  9.7  9.6   Total Protein 6.0 - 8.5 g/dL 6.9  6.7  6.7   Total Bilirubin 0.0 - 1.2 mg/dL 0.4  0.3  0.4   Alkaline Phos 44 - 121 IU/L 113  54  57   AST 0 - 40 IU/L 30  22  23    ALT 0 - 32 IU/L 23  23  27      Lab Results  Component Value Date   WBC 3.1 (L) 07/25/2017   HGB 12.0 07/25/2017   HCT 35.0 07/25/2017   MCV 92.6 07/25/2017   PLT 233 07/25/2017   NEUTROABS 1.8 07/25/2017    ASSESSMENT & PLAN:  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, estrogen receptor positive (HCC) 01/28/2017: Bilateral mastectomies: Right: Grade 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%, PR 95%, HER-2 negative ratio 1.31, Ki-67 3%, 4/4 lymph nodes positive; left mastectomy: PASH and FC changes, no malignancy; T2 N2,  stage IIA AJCC 8 02-14-17: 8/10 lymph nodes positive   CT chest 02/13/2017: 3 mm right middle lobe nodule likely benign benign cysts in the liver, ovarian cysts few small sclerotic lesions in the bone likely bone islands Bone scan 02/13/2017: No bone metastases   Treatment plan: 1. adjuvant chemotherapy with dose dense Adriamycin  and Cytoxan  x4 followed by Taxol  weekly x12  completed 07/25/17 3. Adjuvant radiation 08/20/17- 10/02/17 4. Adjuvant antiestrogen therapy with tamoxifen  (which was originally started prior to surgery) switched to letrozole  10/26/2017 switched to anastrozole  12/21/2021 ABC clinical trial: Aspirin  versus placebo started January 2020 ---------------------------------------------------------------------- Anastrozole  toxicities: Elevated cholesterol: Patient is extremely active and plays several hours of pickleball daily.  Currently on Crestor  Night sweats and insomnia: Has a prescription for gabapentin  100 mg at bedtime.   I discussed with her that there is no difference between the different aromatase inhibitors in terms of cholesterol issues.    Genetics:Neg (VUS BMPR1A)   Breast cancer surveillance: 1.  Breast exam 12/17/2023:  Benign 2.  No role of imaging studies because she had bilateral mastectomies   I discussed with her about wider clinical trial.  Once it is open we will call her and discuss.  Bone density will be ordered for February 2026. 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 Therapy extended to ten years due to hormonal nature and potential benefits. Discussed late recurrence risks and ongoing benefits post-therapy. Kisqali trial for early-stage breast cancer may be an option. - Continue anastrozole  therapy for a total of ten years. - Add name to the list for the upcoming Kisqali trial and provide information when available. - Continue Gardent reveal blood test every six months.  Menopausal symptoms (hot flashes, insomnia, joint pain) related to endocrine therapy Experiencing menopausal symptoms likely due to endocrine therapy. Discussed treatments including magnesium glycinate, Veozah, and gabapentin .  Gabapentin  preferred for dual benefit with minimal side effects. Veozah requires liver monitoring. - Start gabapentin  100 mg at bedtime for hot flashes and insomnia. - Consider magnesium glycinate over-the-counter for muscle aches. - Discuss Veozah as a non-hormonal option for hot flashes if needed.      No orders of the defined types were placed in this encounter.  The patient has a good understanding of the overall plan. she agrees with it. she will call with any problems that may develop before the next visit here. Total time spent: 30 mins including face to face time and time spent for planning, charting and co-ordination of care   Naomi MARLA Chad, MD 12/17/23

## 2023-12-17 NOTE — Assessment & Plan Note (Signed)
 01/28/2017: Bilateral mastectomies: Right: Grade 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%, PR 95%, HER-2 negative ratio 1.31, Ki-67 3%, 4/4 lymph nodes positive; left mastectomy: PASH and FC changes, no malignancy; T2 N2,  stage IIA AJCC 8 02-14-17: 8/10 lymph nodes positive   CT chest 02/13/2017: 3 mm right middle lobe nodule likely benign benign cysts in the liver, ovarian cysts few small sclerotic lesions in the bone likely bone islands Bone scan 02/13/2017: No bone metastases   Treatment plan: 1. adjuvant chemotherapy with dose dense Adriamycin  and Cytoxan  x4 followed by Taxol  weekly x12 completed 07/25/17 3. Adjuvant radiation 08/20/17- 10/02/17 4. Adjuvant antiestrogen therapy with tamoxifen  (which was originally started prior to surgery) switched to letrozole  10/26/2017 switched to anastrozole  12/21/2021 ABC clinical trial: Aspirin  versus placebo started January 2020 ---------------------------------------------------------------------- Anastrozole  toxicities: Elevated cholesterol: Patient is extremely active and plays several hours of pickleball daily.  She is concerned that anastrozole  is increasing her cholesterol levels.  She will discuss with her cardiologist about reducing cholesterol levels.   I discussed with her that there is no difference between the different aromatase inhibitors in terms of cholesterol issues.    Genetics:Neg (VUS BMPR1A)   Breast cancer surveillance: 1.  Breast exam 12/17/2023: Benign 2.  No role of imaging studies because she had bilateral mastectomies   ABC clinical trial related toxicities: None Return to clinic in 1 year with follow-up

## 2023-12-18 ENCOUNTER — Ambulatory Visit: Payer: BC Managed Care – PPO | Admitting: Hematology and Oncology

## 2024-02-20 ENCOUNTER — Other Ambulatory Visit: Payer: Self-pay | Admitting: *Deleted

## 2024-02-20 ENCOUNTER — Encounter: Payer: Self-pay | Admitting: Hematology and Oncology

## 2024-02-20 DIAGNOSIS — Z170 Estrogen receptor positive status [ER+]: Secondary | ICD-10-CM

## 2024-02-20 NOTE — Progress Notes (Signed)
 Signatera renewal orders placed.

## 2024-04-02 ENCOUNTER — Other Ambulatory Visit: Payer: Self-pay | Admitting: *Deleted

## 2024-04-02 ENCOUNTER — Encounter: Payer: Self-pay | Admitting: Hematology and Oncology

## 2024-04-02 ENCOUNTER — Encounter: Payer: Self-pay | Admitting: Cardiology

## 2024-04-02 DIAGNOSIS — E785 Hyperlipidemia, unspecified: Secondary | ICD-10-CM

## 2024-04-02 MED ORDER — ANASTROZOLE 1 MG PO TABS: 1.0000 mg | ORAL_TABLET | Freq: Every day | ORAL | 1 refills | Status: AC

## 2024-04-02 MED ORDER — ROSUVASTATIN CALCIUM 20 MG PO TABS: 20.0000 mg | ORAL_TABLET | Freq: Every day | ORAL | 0 refills | Status: AC

## 2024-04-14 ENCOUNTER — Telehealth: Payer: Self-pay | Admitting: Cardiology

## 2024-04-14 NOTE — Telephone Encounter (Signed)
 Pt called in asking to switch to Dr. Lonni, is this switch okay?

## 2024-07-13 ENCOUNTER — Other Ambulatory Visit (HOSPITAL_BASED_OUTPATIENT_CLINIC_OR_DEPARTMENT_OTHER)

## 2024-07-14 ENCOUNTER — Ambulatory Visit (HOSPITAL_BASED_OUTPATIENT_CLINIC_OR_DEPARTMENT_OTHER): Admitting: Cardiology

## 2024-12-16 ENCOUNTER — Ambulatory Visit: Admitting: Hematology and Oncology
